# Patient Record
Sex: Female | Born: 1945 | Race: White | Hispanic: No | Marital: Married | Smoking: Former smoker
Health system: Southern US, Community
[De-identification: ages and names within clinical notes are randomized; demographics above are authoritative.]

## PROBLEM LIST (undated history)

## (undated) DIAGNOSIS — J8410 Pulmonary fibrosis, unspecified: Secondary | ICD-10-CM

## (undated) DIAGNOSIS — I340 Nonrheumatic mitral (valve) insufficiency: Secondary | ICD-10-CM

## (undated) DIAGNOSIS — E785 Hyperlipidemia, unspecified: Secondary | ICD-10-CM

## (undated) DIAGNOSIS — Z87891 Personal history of nicotine dependence: Secondary | ICD-10-CM

## (undated) DIAGNOSIS — F419 Anxiety disorder, unspecified: Secondary | ICD-10-CM

## (undated) DIAGNOSIS — K635 Polyp of colon: Secondary | ICD-10-CM

## (undated) DIAGNOSIS — I10 Essential (primary) hypertension: Secondary | ICD-10-CM

## (undated) DIAGNOSIS — J439 Emphysema, unspecified: Secondary | ICD-10-CM

## (undated) DIAGNOSIS — I447 Left bundle-branch block, unspecified: Secondary | ICD-10-CM

## (undated) DIAGNOSIS — I679 Cerebrovascular disease, unspecified: Secondary | ICD-10-CM

## (undated) DIAGNOSIS — K5790 Diverticulosis of intestine, part unspecified, without perforation or abscess without bleeding: Secondary | ICD-10-CM

## (undated) DIAGNOSIS — R42 Dizziness and giddiness: Secondary | ICD-10-CM

## (undated) DIAGNOSIS — E119 Type 2 diabetes mellitus without complications: Secondary | ICD-10-CM

## (undated) DIAGNOSIS — H269 Unspecified cataract: Secondary | ICD-10-CM

## (undated) DIAGNOSIS — I2510 Atherosclerotic heart disease of native coronary artery without angina pectoris: Secondary | ICD-10-CM

## (undated) HISTORY — DX: Unspecified cataract: H26.9

## (undated) HISTORY — PX: ABDOMINAL HYSTERECTOMY: SHX81

## (undated) HISTORY — DX: Dizziness and giddiness: R42

## (undated) HISTORY — DX: Type 2 diabetes mellitus without complications: E11.9

## (undated) HISTORY — PX: ANTERIOR CRUCIATE LIGAMENT REPAIR: SHX115

## (undated) HISTORY — DX: Diverticulosis of intestine, part unspecified, without perforation or abscess without bleeding: K57.90

## (undated) HISTORY — DX: Essential (primary) hypertension: I10

## (undated) HISTORY — DX: Polyp of colon: K63.5

## (undated) HISTORY — DX: Personal history of nicotine dependence: Z87.891

## (undated) HISTORY — DX: Emphysema, unspecified: J43.9

## (undated) HISTORY — DX: Atherosclerotic heart disease of native coronary artery without angina pectoris: I25.10

## (undated) HISTORY — DX: Anxiety disorder, unspecified: F41.9

## (undated) HISTORY — DX: Nonrheumatic mitral (valve) insufficiency: I34.0

## (undated) HISTORY — DX: Pulmonary fibrosis, unspecified: J84.10

## (undated) HISTORY — DX: Cerebrovascular disease, unspecified: I67.9

## (undated) HISTORY — DX: Left bundle-branch block, unspecified: I44.7

## (undated) HISTORY — DX: Hyperlipidemia, unspecified: E78.5

## (undated) HISTORY — PX: COLONOSCOPY: SHX174

## (undated) HISTORY — PX: CAROTID ENDARTERECTOMY: SUR193

---

## 1970-12-24 HISTORY — PX: VEIN SURGERY: SHX48

## 1979-12-25 HISTORY — PX: VESICOVAGINAL FISTULA CLOSURE W/ TAH: SUR271

## 2005-02-08 ENCOUNTER — Ambulatory Visit: Payer: Self-pay | Admitting: Internal Medicine

## 2005-02-14 ENCOUNTER — Ambulatory Visit: Payer: Self-pay | Admitting: Internal Medicine

## 2005-09-07 ENCOUNTER — Ambulatory Visit: Payer: Self-pay | Admitting: Internal Medicine

## 2006-05-08 ENCOUNTER — Ambulatory Visit: Payer: Self-pay | Admitting: Internal Medicine

## 2006-12-24 HISTORY — PX: BREAST BIOPSY: SHX20

## 2007-05-15 ENCOUNTER — Ambulatory Visit: Payer: Self-pay | Admitting: Internal Medicine

## 2007-06-12 ENCOUNTER — Ambulatory Visit: Payer: Self-pay | Admitting: Surgery

## 2008-02-11 ENCOUNTER — Ambulatory Visit: Payer: Self-pay | Admitting: Family Medicine

## 2008-03-22 ENCOUNTER — Ambulatory Visit: Payer: Self-pay | Admitting: Cardiology

## 2008-04-01 ENCOUNTER — Ambulatory Visit: Payer: Self-pay

## 2008-04-01 LAB — CONVERTED CEMR LAB
Chloride: 103 meq/L (ref 96–112)
Creatinine, Ser: 0.6 mg/dL (ref 0.4–1.2)
GFR calc non Af Amer: 107 mL/min
Glucose, Bld: 180 mg/dL — ABNORMAL HIGH (ref 70–99)

## 2008-04-07 ENCOUNTER — Ambulatory Visit: Payer: Self-pay | Admitting: Cardiology

## 2008-05-18 ENCOUNTER — Ambulatory Visit: Payer: Self-pay | Admitting: Family Medicine

## 2008-10-19 ENCOUNTER — Ambulatory Visit: Payer: Self-pay | Admitting: Cardiology

## 2009-03-08 ENCOUNTER — Ambulatory Visit: Payer: Self-pay

## 2009-03-08 ENCOUNTER — Encounter: Payer: Self-pay | Admitting: Cardiology

## 2009-04-05 ENCOUNTER — Ambulatory Visit: Payer: Self-pay | Admitting: Cardiology

## 2009-05-26 ENCOUNTER — Ambulatory Visit: Payer: Self-pay | Admitting: Family Medicine

## 2009-08-27 DIAGNOSIS — E119 Type 2 diabetes mellitus without complications: Secondary | ICD-10-CM

## 2009-08-27 DIAGNOSIS — I447 Left bundle-branch block, unspecified: Secondary | ICD-10-CM

## 2009-08-27 DIAGNOSIS — Z87891 Personal history of nicotine dependence: Secondary | ICD-10-CM

## 2009-08-27 DIAGNOSIS — E785 Hyperlipidemia, unspecified: Secondary | ICD-10-CM

## 2009-08-27 DIAGNOSIS — I10 Essential (primary) hypertension: Secondary | ICD-10-CM | POA: Insufficient documentation

## 2009-08-27 DIAGNOSIS — I2510 Atherosclerotic heart disease of native coronary artery without angina pectoris: Secondary | ICD-10-CM | POA: Insufficient documentation

## 2009-08-27 DIAGNOSIS — I679 Cerebrovascular disease, unspecified: Secondary | ICD-10-CM

## 2009-08-27 DIAGNOSIS — G459 Transient cerebral ischemic attack, unspecified: Secondary | ICD-10-CM | POA: Insufficient documentation

## 2010-02-07 DIAGNOSIS — I6529 Occlusion and stenosis of unspecified carotid artery: Secondary | ICD-10-CM

## 2010-03-07 ENCOUNTER — Encounter: Payer: Self-pay | Admitting: Cardiology

## 2010-03-07 ENCOUNTER — Ambulatory Visit: Payer: Self-pay

## 2010-03-22 ENCOUNTER — Ambulatory Visit: Payer: Self-pay | Admitting: Cardiovascular Disease

## 2010-03-23 ENCOUNTER — Encounter: Payer: Self-pay | Admitting: Cardiovascular Disease

## 2010-03-27 ENCOUNTER — Telehealth (INDEPENDENT_AMBULATORY_CARE_PROVIDER_SITE_OTHER): Payer: Self-pay

## 2010-03-28 ENCOUNTER — Ambulatory Visit: Payer: Self-pay

## 2010-03-28 ENCOUNTER — Ambulatory Visit: Payer: Self-pay | Admitting: Vascular Surgery

## 2010-03-28 ENCOUNTER — Telehealth (INDEPENDENT_AMBULATORY_CARE_PROVIDER_SITE_OTHER): Payer: Self-pay | Admitting: *Deleted

## 2010-03-28 ENCOUNTER — Ambulatory Visit: Payer: Self-pay | Admitting: Cardiology

## 2010-03-28 ENCOUNTER — Encounter (HOSPITAL_COMMUNITY): Admission: RE | Admit: 2010-03-28 | Discharge: 2010-05-24 | Payer: Self-pay | Admitting: Cardiovascular Disease

## 2010-04-03 ENCOUNTER — Telehealth: Payer: Self-pay | Admitting: Cardiovascular Disease

## 2010-04-05 ENCOUNTER — Ambulatory Visit: Payer: Self-pay | Admitting: Vascular Surgery

## 2010-04-05 ENCOUNTER — Inpatient Hospital Stay (HOSPITAL_COMMUNITY): Admission: RE | Admit: 2010-04-05 | Discharge: 2010-04-06 | Payer: Self-pay | Admitting: Vascular Surgery

## 2010-04-05 ENCOUNTER — Encounter: Payer: Self-pay | Admitting: Vascular Surgery

## 2010-04-11 ENCOUNTER — Telehealth: Payer: Self-pay | Admitting: Cardiovascular Disease

## 2010-04-18 ENCOUNTER — Encounter: Payer: Self-pay | Admitting: Cardiovascular Disease

## 2010-04-18 ENCOUNTER — Ambulatory Visit: Payer: Self-pay | Admitting: Vascular Surgery

## 2010-04-24 ENCOUNTER — Telehealth: Payer: Self-pay | Admitting: Cardiovascular Disease

## 2010-05-02 ENCOUNTER — Ambulatory Visit: Payer: Self-pay | Admitting: Vascular Surgery

## 2010-05-11 ENCOUNTER — Telehealth: Payer: Self-pay | Admitting: Cardiovascular Disease

## 2010-05-31 ENCOUNTER — Ambulatory Visit: Payer: Self-pay | Admitting: Family Medicine

## 2010-07-13 ENCOUNTER — Ambulatory Visit: Payer: Self-pay | Admitting: Vascular Surgery

## 2010-07-20 ENCOUNTER — Ambulatory Visit: Payer: Self-pay | Admitting: Vascular Surgery

## 2010-07-31 ENCOUNTER — Encounter: Payer: Self-pay | Admitting: Cardiovascular Disease

## 2010-08-31 ENCOUNTER — Ambulatory Visit: Payer: Self-pay | Admitting: Cardiovascular Disease

## 2010-10-02 ENCOUNTER — Encounter: Payer: Self-pay | Admitting: Cardiology

## 2010-10-03 ENCOUNTER — Ambulatory Visit: Payer: Self-pay

## 2010-10-03 ENCOUNTER — Encounter: Payer: Self-pay | Admitting: Cardiovascular Disease

## 2010-11-01 ENCOUNTER — Telehealth (INDEPENDENT_AMBULATORY_CARE_PROVIDER_SITE_OTHER): Payer: Self-pay | Admitting: *Deleted

## 2010-12-11 ENCOUNTER — Telehealth: Payer: Self-pay | Admitting: Cardiovascular Disease

## 2010-12-14 ENCOUNTER — Encounter: Payer: Self-pay | Admitting: Cardiovascular Disease

## 2011-01-23 NOTE — Assessment & Plan Note (Signed)
Summary: F6M/AMD   Visit Type:  Follow-up Primary Miranda Manning:  Dr. Burnett Sheng  CC:  Denies chest pain or shortness of breath..  History of Present Illness: Miranda Manning is a 65 year old woman with history of severe carotid arterial disease, coronary artery disease, moderate mitral regurgitation without prolapse, mild pulmonary hypertension,hyperlipidemia, hypertension and diabetes who presents for routine followup. she had a right carotid endarterectomy on April 13.  Recent carotid arterial study dated 03/07/10 shows 80-99% right proximal internal carotid arterial disease, 60-79% disease noted on the left (suspect low end of the range).  she is now retired from [employer], feels well and has no complaints.. she is active and has no chest discomfort, shortness of breath, lightheadedness near syncope.She states that she takes simvastatin 30 mg daily. She reports having problems on Lipitor and Crestor but she's not sure of the doses.  Last stress test was April 2009 which showed no significant ischemia.  EKG shows normal sinus rhythm with rate 66 beats per minute, left bundle branch block  Cholesterol this month is 193, LDL 112, HDL 60, triglycerides 100, hemoglobin A1c 6.7, glucose is 150   Current Medications (verified): 1)  Aspirin Ec 325 Mg Tbec (Aspirin) .... Take One Tablet By Mouth Daily 2)  Klor-Con M10 10 Meq Cr-Tabs (Potassium Chloride Crys Cr) .Marland Kitchen.. 1 Tab  By Mouth Daily 3)  Nadolol 40 Mg Tabs (Nadolol) .... Take 1/2 Tablet By Mouth Daily 4)  Simvastatin 20 Mg Tabs (Simvastatin) .... Take 1 1/2 Tablet By Mouth Daily At Bedtime 5)  Hydrochlorothiazide 25 Mg Tabs (Hydrochlorothiazide) .... Take One Tablet By Mouth Daily. 6)  Glipizide Xl 5 Mg Xr24h-Tab (Glipizide) .Marland Kitchen.. 1 Tab Daily 7)  Ativan 0.5 Mg Tabs (Lorazepam) .Marland Kitchen.. 1 Tab Three Times A Day As Needed 8)  Caltrate 600 1500 Mg Tabs (Calcium Carbonate) .Marland Kitchen.. 1 Tab Daily 9)  Vitamin E 1000 Unit Caps (Vitamin E) .Marland Kitchen.. 1 Tab Daily 10)  Vitamin C  Cr 500 Mg Cr-Tabs (Ascorbic Acid) .Marland Kitchen.. 1 Tab By Mouth Daily 11)  Vitamin D3 1000 Unit Caps (Cholecalciferol) .Marland Kitchen.. 1 Tab By Mouth Daily 12)  Fish Oil 1000 Mg Caps (Omega-3 Fatty Acids) .... 2 Tabs Daily 13)  Crestor 10 Mg Tabs (Rosuvastatin Calcium) .Marland Kitchen.. 1 Tab By Mouth At Bedtime  Allergies (verified): 1)  ! Codeine  Past History:  Past Medical History: Last updated: 08/27/2009 TOBACCO ABUSE, HX OF (ICD-V15.82) LEFT BUNDLE BRANCH BLOCK (ICD-426.3) HYPERLIPIDEMIA (ICD-272.4) DM (ICD-250.00) HYPERTENSION (ICD-401.9) CAD (ICD-414.00) CEREBROVASCULAR DISEASE (ICD-437.9) MITRAL REGURGITATION, SEVERE (ICD-396.3)  Past Surgical History: Last updated: 11/10/2009 Knee-repair after accident  Hysterectomy-74 Vein-leg  Family History: Last updated: 08/27/2009  Really noncontributory.      Social History: Last updated: 08/27/2009  She is married, has 3 children, one is a Engineer, civil (consulting) at   [employer] named Inetta Fermo.  I take care of her  husband, bobby Eustache.  He has had mitral valve replacement.  She works as  a Haematologist as mentioned above.   Risk Factors: Alcohol Use: 0 (03/22/2010) Caffeine Use: 0 (03/22/2010) Exercise: no (03/22/2010)  Risk Factors: Smoking Status: quit > 6 months (03/22/2010) Packs/Day: 0.75 (03/22/2010)  Review of Systems  The patient denies fever, weight loss, weight gain, vision loss, decreased hearing, hoarseness, chest pain, syncope, dyspnea on exertion, peripheral edema, prolonged cough, abdominal pain, incontinence, muscle weakness, depression, and enlarged lymph nodes.    Vital Signs:  Patient profile:   65 year old female Height:      61.5  inches Weight:      145 pounds BMI:     27.05 Pulse rate:   60 / minute BP sitting:   158 / 74  (left arm) Cuff size:   regular  Vitals Entered By: Bishop Dublin, CMA (August 31, 2010 11:37 AM)  Physical Exam  General:  Well developed, well nourished, in no acute distress. Head:   normocephalic and atraumatic Neck:  Neck supple, no JVD. No masses, thyromegaly or abnormal cervical nodes. Lungs:  Clear bilaterally to auscultation and percussion. Heart:  Non-displaced PMI, chest non-tender; regular rate and rhythm, S1, S2 without murmurs, rubs or gallops. Carotid upstroke normal, 1+ bruit on the left, healing CEA site on the right.  Pedals normal pulses. No edema, no varicosities. Abdomen:   abdomen soft and non-tender without masses Msk:  Back normal, normal gait. Muscle strength and tone normal. Pulses:  pulses normal in all 4 extremities Extremities:  No clubbing or cyanosis. Neurologic:  Alert and oriented x 3. Skin:  Intact without lesions or rashes. Psych:  Normal affect.   Impression & Recommendations:  Problem # 1:  CAROTID ARTERY DISEASE (ICD-433.10) recent carotid endarterectomy by Dr. Hart Rochester in [place]. She is doing well 5 months following her surgery.  Her updated medication list for this problem includes:    Aspirin Ec 325 Mg Tbec (Aspirin) .Marland Kitchen... Take one tablet by mouth daily  Problem # 2:  HYPERLIPIDEMIA (ICD-272.4) She has been able to tolerate simvastatin 30 mg daily. Her LDL continues to be above goal at 112. We will add zetia 5 mg, titrating up to 10 mg daily. we have encouraged her to try to lose several pounds in an effort to decrease her cholesterol.  Her updated medication list for this problem includes:    Simvastatin 20 Mg Tabs (Simvastatin) .Marland Kitchen... Take 1 1/2 tablet by mouth daily at bedtime  Problem # 3:  HYPERTENSION (ICD-401.9) blood pressure is well controlled on her current medication regimen.  Her updated medication list for this problem includes:    Aspirin Ec 325 Mg Tbec (Aspirin) .Marland Kitchen... Take one tablet by mouth daily    Nadolol 40 Mg Tabs (Nadolol) .Marland Kitchen... Take 1/2 tablet by mouth daily    Hydrochlorothiazide 25 Mg Tabs (Hydrochlorothiazide) .Marland Kitchen... Take one tablet by mouth daily.  Other Orders: EKG w/ Interpretation  (93000)

## 2011-01-23 NOTE — Progress Notes (Signed)
Summary: Nuc. Pre-Procedure  Phone Note Outgoing Call Call back at [facility] Phone [phone]   Call placed by: Irean Hong, RN,  March 27, 2010 2:18 PM Summary of Call: Left message with information on Myoview Information Sheet (see scanned document for details).      Nuclear Med Background Indications for Stress Test: Evaluation for Ischemia, Surgical Clearance  Indications Comments: Pending (R) CEA by  History: Echo, Myocardial Perfusion Study, MVP  History Comments: 4/09 MPS: (-) ischemia, EF=63%. 03/07/10 Echo : EF=50-55%, MVP, Mod. MR  Symptoms: DOE    Nuclear Pre-Procedure Cardiac Risk Factors: Carotid Disease, History of Smoking, Hypertension, LBBB, Lipids, NIDDM, PVD Height (in): 61

## 2011-01-23 NOTE — Progress Notes (Signed)
Summary: RX  Phone Note Refill Request Call back at Home Phone [phone] Message from:  Patient on May 11, 2010 10:59 AM  Refills Requested: Medication #1:  SIMVASTATIN 20 MG TABS Take 1 1/2 tablet by mouth daily at bedtime [facility]-PT HAS DISCONTINUED CRESTOR AND IS FEELING BETTER AND WOULD LIKE TO GO BACK ON SIMVASTATIN  Initial call taken by: Harlon Flor,  May 11, 2010 11:00 AM Caller: SELF Call For: Miranda Manning  Follow-up for Phone Call        Pt unwilling to reduce or retry Crestor.  Requesting to go back on Simvastatin 30mg  daily which she has previously been able to tolerate without side effects.  Please advise if this is appropriate and acceptable.  Thanks. Follow-up by: Cloyde Reams RN,  May 12, 2010 9:22 AM    Additional Follow-up for Phone Call Additional follow up Details #2::    We can switch back to simvastatin though cholesterol was too high on that alone. Will need to add zetia which can drop number another 10%   Appended Document: RX Attempted to call pt.  LMOM TCB. EWJ  Appended Document: RX Called spoke with pt, pt adamently states she is NOT going to take any additional cholesterol meds.  She is taking the max her body can tolerate, she refuses to take any additional meds, and refuses to be in pain secondary to those medications.  Pt states she is going back to Dr Burnett Sheng to have her cholesterol checked in July.  EWJ

## 2011-01-23 NOTE — Miscellaneous (Signed)
Summary: Orders Update  Clinical Lists Changes  Orders: Added new Test order of Carotid Duplex (Carotid Duplex) - Signed 

## 2011-01-23 NOTE — Progress Notes (Signed)
Summary: SEND CAROTID  Phone Note Call from Patient Call back at Home Phone [phone]   Caller: SELF Call For: [name] Summary of Call: PT WOULD LIKE A COPY OF HER CAROTID ULTRASOUND TO BE SENT TO [name] #[phone] Initial call taken by: Harlon Flor,  November 01, 2010 10:08 AM

## 2011-01-23 NOTE — Progress Notes (Signed)
Summary: MEDICATION PROBLEMS  Phone Note Call from Patient Call back at Home Phone [phone]   Caller: SELF Call For: [facility] Summary of Call: PT IS HAVING PAIN WITH CRESTOR AND WOULD LIKE TO SWITCH TO A DIFFERENT MEDICATION Initial call taken by: Harlon Flor,  Apr 24, 2010 9:35 AM  Follow-up for Phone Call        Increased Crestor to 10mg  once daily on 04/11/10, leg cramping x last 3 nights, aching pain.  Took Crestor 3 weeks prior to increase without problems.  Has prviously been on Zocor no problems on 30mg  daily, but unable to tolerate 40mg  d/t leg cramping and pains.  Please advise. Thanks.   Follow-up by: Cloyde Reams RN,  Apr 24, 2010 9:58 AM  Additional Follow-up for Phone Call Additional follow up Details #1::        Would go back on lower dose crestor for now, 5 mg daily. 10 mg three times a week     Appended Document: MEDICATION PROBLEMS Called spoke with pt advised of Dr Windell Hummingbird recommendations to decr Crestor back to 5mg  daily, and try to gradually incr up to 10mg  three times a week.  Pt states she is not going to increase the Crestor the pain is not worth it.  Didn't take any Crestor last night and she didn't have any leg cramps.  Pt states she has been through this before with the cholesterol medications and she doesn't understand why she has to try this again.  She will decr Crestor back to 5mg  daily and will discuss at next OV with Dr Mariah Milling. EWJ

## 2011-01-23 NOTE — Progress Notes (Signed)
Summary: RX  Phone Note Refill Request Call back at Home Phone [phone] Message from:  Patient on April 11, 2010 9:49 AM  Refills Requested: Medication #1:  CRESTOR 10 MG TABS Take 1/2 tablet by mouth daily for 3 weeks then increase 10 mg daily. DOES THE DR Genella Rife TO BUMP THIS UP?  PT NEEDS A REFILL-[facility]  Initial call taken by: Harlon Flor,  April 11, 2010 9:49 AM    New/Updated Medications: CRESTOR 10 MG TABS (ROSUVASTATIN CALCIUM) 1 tab by mouth at bedtime Prescriptions: CRESTOR 10 MG TABS (ROSUVASTATIN CALCIUM) 1 tab by mouth at bedtime  #30 x 6   Entered by:   Charlena Cross, RN, BSN   Authorized by:   Dossie Arbour MD   Signed by:   Charlena Cross, RN, BSN on 04/11/2010   Method used:   Electronically to        [facility] (retail)       [address]       [place], [place]  [address]       Ph: [phone]       Fax: [phone]   RxID:   [id number]

## 2011-01-23 NOTE — Assessment & Plan Note (Signed)
Summary: Cardiology Nuclear Study  Nuclear Med Background Indications for Stress Test: Evaluation for Ischemia, Surgical Clearance  Indications Comments: Pending (R) CEA by  History: Echo, Myocardial Perfusion Study, MVP  History Comments: 4/09 MPS: (-) ischemia, EF=63%. 03/07/10 Echo : EF=50-55%, MVP, Mod. MR  Symptoms: DOE    Nuclear Pre-Procedure Cardiac Risk Factors: Carotid Disease, History of Smoking, Hypertension, LBBB, Lipids, NIDDM, PVD Caffeine/Decaff Intake: none NPO After: 8:00 PM Lungs: clear IV 0.9% NS with Angio Cath: 20g     IV Site: (R) AC IV Started by: Stanton Kidney EMT-P Chest Size (in) 36     Cup Size D     Height (in): 61.5 Weight (lb): 139 BMI: 25.93 Tech Comments: This patient was in a Nodal rhythm today, we were unable to do Adenosine for this reason. S.Klein was consulted and agreed that we could do Lexiscan.  Nuclear Med Study 1 or 2 day study:  1 day     Stress Test Type:  Eugenie Birks Reading MD:  Olga Millers, MD     Referring MD:  T.Gollan Resting Radionuclide:  Technetium 76m Tetrofosmin     Resting Radionuclide Dose:  11.0 mCi  Stress Radionuclide:  Technetium 41m Tetrofosmin     Stress Radionuclide Dose:  33.0 mCi   Stress Protocol   Lexiscan: 0.4 mg   Stress Test Technologist:  Milana Na EMT-P     Nuclear Technologist:  Domenic Polite CNMT  Rest Procedure  Myocardial perfusion imaging was performed at rest 45 minutes following the intravenous administration of Myoview Technetium 57m Tetrofosmin.  Stress Procedure  The patient received IV Lexiscan 0.4 mg over 15-seconds.  Myoview injected at 30-seconds.  There were no significant changes with infusion.  Quantitative spect images were obtained after a 45 minute delay.  QPS Raw Data Images:  Acuisition technically good; normal left ventricular size. Stress Images:  There is mild decreased uptake in the septum. Rest Images:  There is mild decreased uptake in the septum. Subtraction  (SDS):  No evidence of ischemia. Transient Ischemic Dilatation:  1.18  (Normal <1.22)  Lung/Heart Ratio:  .28  (Normal <0.45)  Quantitative Gated Spect Images QGS EDV:  99 ml QGS ESV:  38 ml QGS EF:  62 % QGS cine images:  Normal wall motion.   Overall Impression  Exercise Capacity: Lexiscan study with no exercise. BP Response: Normal blood pressure response. Clinical Symptoms: No chest pain ECG Impression: Not interpretable due to baseline LBBB. Overall Impression: Low risk lexiscan nuclear study with small fixed septal defect possibly secondary to patient's LBBB; no ischemia; note, patient with baseline sinus rhythm with occasional junctional escape beats.

## 2011-01-23 NOTE — Progress Notes (Signed)
Summary: PROBLEMS  Phone Note Call from Patient Call back at Home Phone [phone]   Caller: SELF Call For: [facility] Summary of Call: PT IS HAVING SINUS DRAINAGE AND WOULD LIKE TO KNOW IF SHE COULD HAVE SOMETHING CALLED IN Initial call taken by: Harlon Flor,  April 03, 2010 8:28 AM  Follow-up for Phone Call        instructed pt to try chloricidine HBP OTC. Follow-up by: Charlena Cross, RN, BSN,  April 03, 2010 10:01 AM

## 2011-01-23 NOTE — Progress Notes (Signed)
Summary: [facility]   Imported By: Harlon Flor 04/25/2010 09:38:59  _____________________________________________________________________  External Attachment:    Type:   Image     Comment:   External Document

## 2011-01-23 NOTE — Assessment & Plan Note (Signed)
Summary: F/U 1 YEAR   Visit Type:  Follow-up Primary Provider:  Dr. Burnett Sheng  CC:  occ. headache, SOB with vacuuming the floor, and RLE edema r/t prior surgeries.  History of Present Illness: Miranda Manning is a 65 year old woman with history of severe carotid arterial disease, coronary artery disease, moderate mitral regurgitation without prolapse, mild pulmonary hypertension,hyperlipidemia, hypertension and diabetes who presents for routine followup.  Recent carotid arterial study dated 03/07/10 shows 80-99% right proximal internal carotid arterial disease, 60-79% disease noted on the left (suspect low end of the range).  Dr. wall has suggested that she have a second opinion from a vascular surgeon and we will help to set this up. Otherwise she is asymptomatic  She states that she takes simvastatin 30 mg daily. She reports having problems on Lipitor and Crestor but she's not sure of the doses.  she is active and has no other complaints of chest discomfort, shortness of breath, lightheadedness near syncope.  Last stress test was April 2009 which showed no significant ischemia.   Preventive Screening-Counseling & Management  Alcohol-Tobacco     Alcohol drinks/day: 0     Smoking Status: quit > 6 months     Packs/Day: 0.75     Year Started: 1963     Year Quit: 1989  Caffeine-Diet-Exercise     Caffeine use/day: 0     Does Patient Exercise: no  Current Problems (verified): 1)  Carotid Artery Disease  (ICD-433.10) 2)  Tobacco Abuse, Hx of  (ICD-V15.82) 3)  Left Bundle Branch Block  (ICD-426.3) 4)  Hyperlipidemia  (ICD-272.4) 5)  Dm  (ICD-250.00) 6)  Hypertension  (ICD-401.9) 7)  Cad  (ICD-414.00) 8)  Cerebrovascular Disease  (ICD-437.9) 9)  Mitral Regurgitation, Severe  (ICD-396.3)  Current Medications (verified): 1)  Aspirin Ec 325 Mg Tbec (Aspirin) .... Take One Tablet By Mouth Daily 2)  Klor-Con M10 10 Meq Cr-Tabs (Potassium Chloride Crys Cr) .Marland Kitchen.. 1 Tab  By Mouth Daily 3)   Nadolol 40 Mg Tabs (Nadolol) .... Take 1/2 Tablet By Mouth Daily 4)  Simvastatin 20 Mg Tabs (Simvastatin) .... Take 1 1/2 Tablet By Mouth Daily At Bedtime 5)  Hydrochlorothiazide 25 Mg Tabs (Hydrochlorothiazide) .... Take One Tablet By Mouth Daily. 6)  Glipizide Xl 5 Mg Xr24h-Tab (Glipizide) .Marland Kitchen.. 1 Tab Daily 7)  Ativan 0.5 Mg Tabs (Lorazepam) .Marland Kitchen.. 1 Tab Three Times A Day As Needed 8)  Caltrate 600 1500 Mg Tabs (Calcium Carbonate) .Marland Kitchen.. 1 Tab Daily 9)  Vitamin E 1000 Unit Caps (Vitamin E) .Marland Kitchen.. 1 Tab Daily 10)  Vitamin C Cr 500 Mg Cr-Tabs (Ascorbic Acid) .Marland Kitchen.. 1 Tab By Mouth Daily 11)  Vitamin D3 1000 Unit Caps (Cholecalciferol) .Marland Kitchen.. 1 Tab By Mouth Daily 12)  Fish Oil 1000 Mg Caps (Omega-3 Fatty Acids) .... 2 Tabs Daily  Allergies (verified): 1)  ! Codeine  Social History: Smoking Status:  quit > 6 months Packs/Day:  0.75 Alcohol drinks/day:  0 Caffeine use/day:  0 Does Patient Exercise:  no  Review of Systems  The patient denies fever, weight loss, weight gain, vision loss, decreased hearing, hoarseness, chest pain, syncope, dyspnea on exertion, peripheral edema, prolonged cough, abdominal pain, incontinence, muscle weakness, depression, and enlarged lymph nodes.    Vital Signs:  Patient profile:   65 year old female Height:      61 inches Weight:      141.50 pounds BMI:     26.83 Pulse rate:   56 / minute Pulse rhythm:   regular BP  sitting:   138 / 70  (left arm) BP standing:   174 / 76  (right arm) Cuff size:   regular  Vitals Entered By: Charlena Cross, RN, BSN (March 22, 2010 9:58 AM)  Physical Exam  General:  well-appearing middle-aged woman in no apparent distress, HEENT is benign, neck is supple, no JVP, 2+ carotid bruit auscultated on the right, heart sounds are regular with normal S1-S2 no murmurs appreciated the lungs appear to auscultation with no wheezes Rales, abdominal exam is benign, no significant lower extremity edema, neurologic exam is nonfocal the skin is  warm and dry, pulses are equal and symmetrical in her upper extremities, mildly diminished in her lower extremities.   Impression & Recommendations:  Problem # 1:  CAROTID ARTERY DISEASE (ICD-433.10) she now has severe right carotid arterial disease. Since last year, she has taken herself off as a for uncertain reasons. She states her cholesterol is greater than 170 which is well above goal for her. Her goal is less than 150 with LDL less than 70 and her severe peripheral vascular disease.  We have talked her about this and she is unwilling to raise the dose of her simvastatin. we have suggested that she retry Crestor at 5 mg daily for several weeks before titrating up to 10 mg daily. If she is unable to tolerate this, we'll add zetia at 10 mg daily.  In preparation for carotid arterial surgery on the right, we have ordered a stress test to clear her. We will refer her to vascular surgery in [place].  Her updated medication list for this problem includes:    Aspirin Ec 325 Mg Tbec (Aspirin) .Marland Kitchen... Take one tablet by mouth daily  Problem # 2:  HYPERLIPIDEMIA (ICD-272.4) Please see above, we are trying to achieve a total cholesterol less than 150 given her severe and progressive peripheral vascular disease. She is reluctant to go to higher doses of her medication and states her cholesterol is greater than 170. We will try Crestor 5 mg titrating up to 10 mg daily if she is able to tolerate this. If she is unable to tolerate Crestor, we will try zetia.    Her updated medication list for this problem includes:    Simvastatin 20 Mg Tabs (Simvastatin) .Marland Kitchen... Take 1 1/2 tablet by mouth daily at bedtime    Crestor 10 Mg Tabs (Rosuvastatin calcium) .Marland Kitchen... Take 1/2 tablet by mouth daily for 3 weeks then increase 10 mg daily  Problem # 3:  HYPERTENSION (ICD-401.9) Blood pressure appears relatively well-controlled on today's visit.  Her updated medication list for this problem includes:    Aspirin Ec  325 Mg Tbec (Aspirin) .Marland Kitchen... Take one tablet by mouth daily    Nadolol 40 Mg Tabs (Nadolol) .Marland Kitchen... Take 1/2 tablet by mouth daily    Hydrochlorothiazide 25 Mg Tabs (Hydrochlorothiazide) .Marland Kitchen... Take one tablet by mouth daily.  Other Orders: Nuclear Stress Test (Nuc Stress Test)  Patient Instructions: 1)  Your physician recommends that you schedule a follow-up appointment in: 6 months 2)  Your physician has requested that you have an adenosine myoview.  For further information please visit [email or web address]  Please follow instruction sheet, as given. 03/28/10 @ 8:30. 3)  You have been referred to [facility] in [place] . 4)  Your physician has recommended you make the following change in your medication: crestor 5 mg daily for 3 weeks then increase dose to 10 mg daily

## 2011-01-23 NOTE — Progress Notes (Signed)
  Faxed stress over to [facility] to fax [phone] [facility]  March 28, 2010 4:31 PM

## 2011-01-25 NOTE — Progress Notes (Signed)
Summary: Cholesterol  Phone Note Call from Patient Call back at Home Phone [phone] Call back at [phone]   Caller: Self Call For: Gollan Summary of Call: Pt is returning a call regarding her Cholesterol.  States that she has been waiting since Friday for a call back. Initial call taken by: Harlon Flor,  December 11, 2010 10:16 AM  Follow-up for Phone Call        Notified patient of issues with cholesterol.  She is having problems with money regarding the zetia it is too expensive.  She also would like to know if can switch to generic lipitor; can't tolerate more than 30 mg.  She only wants to switch to lipitor if you think will benefit her.  See las o.v. with recent cholesterol readings.  Follow-up by: Bishop Dublin, CMA,  December 11, 2010 10:34 AM  Additional Follow-up for Phone Call Additional follow up Details #1::        LMOM we will call in the a.m. regarding medications. Additional Follow-up by: Bishop Dublin, CMA,  December 11, 2010 4:57 PM    Additional Follow-up for Phone Call Additional follow up Details #2::    Cholesterol is 193 LDL is 112. She needs to be chol <150, LDL <70 She has moderate carotid disease. High CVA risk. She needs crestor 40, lipitor 80, or stick with what you have and start zetia 10 (that will probably not be good enough, but better) zetia good for 10% drop   Appended Document: Cholesterol Patient does not want to do what Dr. Mariah Milling suggested.  She states, "If he can't understand that then she will go see Dr. Daleen Squibb in [place]".  She will try the Simvastatin 40 mg but she can't afford the Zetia or crestor. I explained to her what Dr. Mariah Milling recommended her to do but she refused to follow his instructions.  She would like to start simvastin 40 mg one tablet at bedtime but can't afford the zetia.  Appended Document: Cholesterol Patient called again regarding her cholesterol medication.  She is taking Simvastatin 20 mg 1 1/2 tablet at  bedtime.  She will increase to simvastin 40 mg one tablet at bedtime and have a cholesterol check in March 2012 with Dr. Burnett Sheng.  A Rx. called into [facility] for Simvastatin 40 mg one tablet at bedtime.

## 2011-01-25 NOTE — Miscellaneous (Signed)
  Clinical Lists Changes  Medications: Changed medication from SIMVASTATIN 20 MG TABS (SIMVASTATIN) one tablet at bedtime to SIMVASTATIN 40 MG TABS (SIMVASTATIN) one tablet at bedtime

## 2011-03-14 LAB — CBC
HCT: 34.9 % — ABNORMAL LOW (ref 36.0–46.0)
HCT: 43.2 % (ref 36.0–46.0)
Hemoglobin: 15 g/dL (ref 12.0–15.0)
MCHC: 33.9 g/dL (ref 30.0–36.0)
MCV: 89.4 fL (ref 78.0–100.0)
MCV: 89.5 fL (ref 78.0–100.0)
Platelets: 164 10*3/uL (ref 150–400)
RBC: 3.91 MIL/uL (ref 3.87–5.11)
RBC: 4.82 MIL/uL (ref 3.87–5.11)
WBC: 7.3 10*3/uL (ref 4.0–10.5)
WBC: 8.7 10*3/uL (ref 4.0–10.5)

## 2011-03-14 LAB — URINE MICROSCOPIC-ADD ON

## 2011-03-14 LAB — BASIC METABOLIC PANEL
Chloride: 96 mEq/L (ref 96–112)
Creatinine, Ser: 0.52 mg/dL (ref 0.4–1.2)
GFR calc Af Amer: >60 mL/min (ref >60)

## 2011-03-14 LAB — COMPREHENSIVE METABOLIC PANEL
ALT: 14 U/L (ref 0–35)
AST: 16 U/L (ref 0–37)
BUN: 9 mg/dL (ref 6–23)
Calcium: 10.1 mg/dL (ref 8.4–10.5)
Creatinine, Ser: 0.48 mg/dL (ref 0.4–1.2)
GFR calc Af Amer: >60 mL/min (ref >60)
Glucose, Bld: 142 mg/dL — ABNORMAL HIGH (ref 70–99)
Potassium: 4.2 mEq/L (ref 3.5–5.1)
Sodium: 139 mEq/L (ref 135–145)
Total Protein: 6.4 g/dL (ref 6.0–8.3)

## 2011-03-14 LAB — BLOOD GAS, ARTERIAL
Bicarbonate: 26.8 mEq/L — ABNORMAL HIGH (ref 20.0–24.0)
FIO2: 0.21 %
Patient temperature: 98.6
pH, Arterial: 7.453 — ABNORMAL HIGH (ref 7.350–7.400)

## 2011-03-14 LAB — TYPE AND SCREEN
ABO/RH(D): O POS
Antibody Screen: NEGATIVE

## 2011-03-14 LAB — GLUCOSE, CAPILLARY
Glucose-Capillary: 123 mg/dL — ABNORMAL HIGH (ref 70–99)
Glucose-Capillary: 128 mg/dL — ABNORMAL HIGH (ref 70–99)
Glucose-Capillary: 139 mg/dL — ABNORMAL HIGH (ref 70–99)

## 2011-03-14 LAB — ABO/RH: ABO/RH(D): O POS

## 2011-03-14 LAB — URINALYSIS, ROUTINE W REFLEX MICROSCOPIC
Glucose, UA: NEGATIVE mg/dL
Hgb urine dipstick: NEGATIVE
Urobilinogen, UA: 0.2 mg/dL (ref 0.0–1.0)

## 2011-03-14 LAB — APTT: aPTT: 28 seconds (ref 24–37)

## 2011-03-14 LAB — PROTIME-INR: INR: 0.93 (ref 0.00–1.49)

## 2011-05-08 NOTE — H&P (Signed)
HISTORY AND PHYSICAL EXAMINATION   March 28, 2010   Re:  Miranda Manning, Miranda Manning              DOB:  02-13-1946   The patient is a 65 year old female patient followed by Dr. Daleen Squibb and Dr.  Mariah Milling for many years with known mild carotid occlusive disease which  has progressed with time and now exceeds 80% in severity on the right  side.  She denies any history of stroke, TIAs, amaurosis fugax,  diplopia, blurred vision or syncope.  Recent carotid duplex performed at  [facility] on March 15 revealed a 90% right internal carotid  stenosis and mild left internal carotid stenosis in the 40%-50% range.   CHRONIC MEDICAL PROBLEMS:  1. Left bundle branch block.  2. Hyperlipidemia.  3. Diabetes mellitus.  4. Hypertension.  5. Coronary artery disease.  6. History of mitral regurgitation.   She had a stress test performed of 2009 with no ischemia.  She had  repeat of a Myoview nuclear medicine scan performed on 03/28/2010 and a  2-D echocardiogram revealed ejection fraction of 55%.   PAST SURGICAL HISTORY:  1. Hysterectomy.  2. Removal of varicose veins in right leg 1972.  3. Right knee surgery 1978.   FAMILY HISTORY:  Positive for coronary artery disease in her mother,  father and brother, stroke in grandparents.  Negative for diabetes.   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  She has not smoked in 21 years, does not use alcohol.   REVIEW OF SYSTEMS:  She does have dyspnea on exertion and easy  fatigability.  No chest pain.  No productive cough, bronchitis,  wheezing.  Denies any GI or GU symptoms.  Does have right knee  discomfort with ambulation, arthritis and joint pain.  All other systems  in review of systems are negative.   PHYSICAL EXAMINATION:  Vital signs:  Blood pressure 130/60, heart rate  66, temperature 97.9.  General:  She is a well-developed, well-nourished  female in no apparent distress.  She is alert and oriented  x3.  HEENT:  Is normal.  EOMs intact.  Chest:  Clear to auscultation.  No rhonchi or  wheezing.  Cardiovascular:  Regular rhythm.  No murmurs.  Carotid pulses  are 3+ with a high-pitched bruit on the right, softer bruit on the left.  Lower extremity exam reveals 2+ femoral, popliteal, dorsalis pedis  pulses.  No distal edema.  Abdomen:  Soft, nontender with no masses.  Musculoskeletal:  Free of major deformities.  Neurologic:  Normal.  Skin:  Free of rashes.   Today I ordered a carotid duplex exam which I reviewed and interpreted  to confirm the previous findings.  She does have a focal 90%-95% right  internal carotid stenosis in agreement with [facility] study.  I  also reviewed all of these records provided by [employer] at  [place].   IMPRESSION:  Severe right internal carotid stenosis - asymptomatic.   Plan is to proceed with right carotid endarterectomy on April 13.  The  risks and benefits have been thoroughly discussed with the patient and  her daughter.  She will obtain the results of the Myoview study which  was performed earlier today at [facility].     Quita Skye Hart Rochester, M.D.  Electronically Signed   JDL/MEDQ  D:  03/28/2010  T:  03/29/2010  Job:  [id number]   cc:   Antonieta Iba, MD  Thomas C. Daleen Squibb, MD, [facility]  Dr Burnett Sheng

## 2011-05-08 NOTE — Assessment & Plan Note (Signed)
[facility]                            [place] OFFICE NOTE   Miranda Manning, Miranda Manning                           MRN:          [id number]  DATE:03/22/2008                            DOB:          [birth year]    CHIEF COMPLAINT:  I wanted you to check out my heart murmur and make  sure I am doing the right things.   Miranda Manning is a delightful 65 year old married white female, wife  of a patient of mine, who comes today with the above concerns.   She was told at age 38 and she had rheumatic fever.  She has had a heart  murmur for years.   She currently complains of dyspnea on exertion with vacuuming or doing  any kind of heavy housework.  She denies any angina per se.  She denies  any orthopnea, PND or peripheral edema.   She has had no palpitations and no history of atrial fib or  irregularity, no presyncope or syncope.   She recently saw Dr. Jerl Mina for a physical and had a 2-D  echocardiogram because her murmur.  This showed anterior mitral valve  leaflet prolapse with severe mitral regurgitation.  Her left ventricular  ejection fraction was 50%.  Though there was no mention that her left  ventricle was enlarged.  In fact, in diastole it was 4.3 cm.  She had  mild left atrial enlargement of 4.6 cm.  There was moderate left  ventricular hypertrophy with a longstanding history of hypertension.  She had mild aortic regurgitation and mild tricuspid regurgitation.   He also heard carotid bruits and obtained carotid Dopplers.  She has a  moderate calcific plaque seen bilaterally.  She had a 50-75% proximal  right internal carotid stenosis.  No hemodynamically significant  stenosis on the left with antegrade flow in both vertebral.   She has a history of a left bundle branch block, duration unknown.  As  mentioned no she has no history of syncope, presyncope.   PAST MEDICAL HISTORY:  She is diabetic.  Last hemoglobin A1c was 6.4%.  She has a  history of hyperlipidemia and has been on simvastatin.  She  has a long history of hypertension and has been on nadolol for 31  years!.   She quit smoking in 1989.  She does not use any recreational products  nor any alcohol.  She does not exercise on a regular basis.  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   Her current medications are nadolol 20 mg p.o. daily, simvastatin 20 mg  a day, hydrochlorothiazide 25 mg a day, glipizide 5 mg a day, vitamin C  5 mg a day, calcium 600 mg a day, potassium over-the-counter, vitamin E  enteric-coated aspirin 325 mg per day, fish oil 4 grams a day.  She  takes lorazepam p.r.n.  She is intolerant of CODEINE.   PREVIOUS SURGERIES:  She had a right knee repair after an accident.  She  does not remember the year.  Hysterectomy and she has had varicose veins  operated on in the right leg.   FAMILY HISTORY:  Really noncontributory.   SOCIAL HISTORY:  She is married, has 3 children, one is a Engineer, civil (consulting) at  [employer] named Inetta Fermo.  I take care of her  husband, bobby Pottenger.  He has had mitral valve replacement.  She works as  a Haematologist as mentioned above.   REVIEW OF SYSTEMS:  All systems questioned and she lists none.  All are  negative except the HPI.   EXAM:  Her blood pressure is 165/88 in the right arm.  Her pulse is 63  and she has a left bundle with first-degree AV block of 244  milliseconds.  The left bundle was old.  She is 5 feet 1 inches, and  weighs 139 pounds.  HEENT:  Normocephalic, atraumatic.  PERRL.  Extraocular is intact.  Sclerae are clear.  She wears glasses.  Facial symmetry is normal.  Dentition satisfactory.  Neck is supple.  Carotids reveal bilateral bruits, right greater than  left.  Thyroid is not enlarged.  Trachea is midline.  LUNGS:  Clear.  HEART:  Reveals a nondisplaced PMI.  She has a loud mid systolic classic  prolapse murmur at the  apex.  It is at least a 3/6.  She has a very  faint diastolic murmur along left sternal border.  There is no gallop.  There is no right ventricular lift.  S2 is paradoxically split with her  left bundle  ABDOMINAL:  Soft, good bowel sounds.  No midline bruit.  There is no  hepatomegaly.  EXTREMITIES:  No cyanosis, clubbing or edema.  Pulses were 2+/4+  bilateral symmetrical.  She has venous varicosities particular right  lower extremity.  She has an old total knee scar on the right.  There is  no sign of DVT.  NEURO:  Exam is intact.  Skin is unremarkable.   ASSESSMENT:  1. Dyspnea on exertion.  With her carotid disease and risk factors,      particularly diabetes, we need to rule out ischemic equivalent      disease.  2. Cerebrovascular disease with nonobstructive carotid plaque.  3. Anterior leaflet mitral valve prolapse with severe mitral      regurgitation.  She has some left atrial enlargement but her left      ventricle is not enlarged at present.  4. Hypertension.  I suspect this has not been under good control,      particularly with her EF being down, not to mention moderate left      ventricular hypertrophy by echo.  5. Type 2 diabetes under good control.  6. Hyperlipidemia on statin.  7. History remote tobacco.  8. Left bundle branch block.   PLAN:  I had a long discussion with Miranda Manning today.  We need to rule  out any ischemic heart disease.  I then need to see her back to go over  these results and perhaps work on her antihypertensive with Dr.  Webb Silversmith input.   PLAN:  1. Exercise rest stress Myoview off of nadolol.  2. Follow with me in several weeks to go over these results and to      work with Dr. Burnett Sheng on optimizing her antihypertensives.  The      patient is on an ACE inhibitor for afterload reduction and LVH.     Thomas C. Daleen Squibb, MD, [facility]  Electronically Signed    TCW/MedQ  DD: 03/22/2008  DT: 03/22/2008  Job #: [id number]   cc:   Jerl Mina

## 2011-05-08 NOTE — Assessment & Plan Note (Signed)
[facility] OFFICE NOTE   MYCHELE, SEYLLER                           MRN:          [id number]  DATE:10/19/2008                            DOB:          [birth year]    Ms. Totton returns today for followup.  She is totally asymptomatic.   She continues not to smoke.   PROBLEM LIST:  1. Severe mitral regurgitation with anterior mitral valve leaflet      prolapse.  Echo in March 2009.  2. Normal left ventricular chamber size with an ejection fraction of      50%.  3. Nonobstructive cerebrovascular disease.  4. Clinically occult coronary artery disease with a negative stress      Myoview on April 01, 2008.  5. Hypertension.  6. Type 2 diabetes, under good control.  7. Hyperlipidemia with intolerance to Vytorin and Crestor.  She is on      simvastatin.  8. History of remote tobacco.  9. Left bundle-branch block.   Her meds are unchanged since last visit except she is now on Welchol 2  tablets b.i.d. per Dr. Burnett Sheng.  She is also on potassium, unknown dose.   Her blood pressure today is 140/76.  Her pulse is 52 and regular.  Her  weight is 141, which is stable.  The rest of her exam is unchanged.   Ms. Nery is stable from my standpoint.  However, return in March at  which time we will repeat a 2-D echocardiogram as well as her carotid  Dopplers.  We spent a lot of time talking about symptoms of cardiac  decompensation as well as coronary insufficiency.  We talked about risk  factors and a good choice she is making taking her medications and not  smoking.     Thomas C. Daleen Squibb, MD, [facility]  Electronically Signed    TCW/MedQ  DD: 10/19/2008  DT: 10/20/2008  Job #: [id number]   cc:   Jerl Mina

## 2011-05-08 NOTE — Procedures (Signed)
CAROTID DUPLEX EXAM   INDICATION:  Follow up carotid artery disease.   HISTORY:  Diabetes:  Yes.  Cardiac:  Yes.  Hypertension:  Yes.  Smoking:  Previous.  Previous Surgery:  No carotid surgery.  CV History:  Asymptomatic.  Amaurosis Fugax No, Paresthesias No, Hemiparesis No.                                       RIGHT             LEFT  Brachial systolic pressure:  Brachial Doppler waveforms:  Vertebral direction of flow:        Antegrade  DUPLEX VELOCITIES (cm/sec)  CCA peak systolic                   87  ECA peak systolic                   183  ICA peak systolic                   475  ICA end diastolic                   143  PLAQUE MORPHOLOGY:                  Calcific  PLAQUE AMOUNT:                      Severe  PLAQUE LOCATION:                    ICA/bifurcation   IMPRESSION:  1. Limited right-side-only study due to repeat study.  2. Right internal carotid artery shows evidence of 80% to 99%      stenosis.         ___________________________________________  Quita Skye Hart [place], M.D.   AS/MEDQ  D:  03/28/2010  T:  03/29/2010  Job:  [id number]

## 2011-05-08 NOTE — Assessment & Plan Note (Signed)
[facility] OFFICE NOTE   Miranda, Manning                           MRN:          [id number]  DATE:04/05/2009                            DOB:          04-Feb-1946    Miranda Manning comes in today for followup.   PROBLEM LIST:  1. History of severe mitral regurgitation, which is asymptomatic with      anterior mitral valve leaflet prolapse.  This echo was done at      [facility] in 2009.  2. Normal left ventricular systolic chamber size with an ejection      fraction of 50%.  3. Nonobstructive cerebrovascular disease.  4. Coronary artery disease with a stress Myoview on April 01, 2008.  5. Hypertension.  6. Type 2 diabetes.  7. Hyperlipidemia.  8. Left bundle-branch block.  9. History of remote tobacco.   She continues not to smoke.  Her WelChol has been discontinued and now  she is on Zetia along with simvastatin per Dr. Burnett Sheng at [facility].  The patient denies any orthopnea, PND, or peripheral edema  except in her right lower extremity which is from venous varicosities.  She has no major dyspnea on exertion.  She has had no angina.  There has  been no syncope.   MEDICATIONS:  1. Nadolol 20 mg p.o. daily.  2. Simvastatin 20 mg nightly.  3. Hydrochlorothiazide 25 mg per day.  4. Glipizide 5 mg per day.  5. Vitamin C.  6. Calcium 600 mg a day.  7. Vitamin E.  8. Enteric-coated aspirin 325 mg a day.  9. Fish oil 4 g per day.  10.Potassium daily.  11.Zetia 10 mg per day.  12.Fosamax.   PHYSICAL EXAMINATION:  VITAL SIGNS:  Her blood pressure is 142/72.  She  was in a rush when she came to the office and her heart rate is 54 and  regular.  Her electrocardiogram shows sinus rhythm with some first-  degree AV block and a left bundle which is stable.  Weight is 138, down  3.  HEENT:  Normal.  NECK:  Supple.  Carotid upstrokes were equal bilaterally with a high-  pitched bruit on the right, soft  bruit on the left.  Thyroid is not  enlarged.  Trachea is midline.  CHEST:  Lungs are clear to auscultation and percussion.  Reduced breath  sounds throughout.  HEART:  A paradoxically split S2, soft systolic murmur at the apex.  No  gallop.  PMI is nondisplaced.  ABDOMEN:  Soft, good bowel sounds.  No midline bruit.  No hepatomegaly.  EXTREMITIES:  There is no cyanosis or clubbing.  There is venous  varicosities in the anterior tibia on the right, trace edema.  Pulses  are present.  MUSCULOSKELETAL:  Chronic arthritic changes.  SKIN:  A few ecchymoses.   A 2-D echocardiogram repeated on March 08, 2009, showed mild to moderate  mitral regurgitation with an essentially jet posteriorly with prolapse  that was mild in the anterior leaflet.  She has some mild restriction of  the posterior leaflet.  Her left atrium is mildly dilated.  EF is 50-  60%.  Left ventricular chamber size was normal.  She had paradoxical  motion in ventricular septum consistent with her left bundle.   Her carotid Doppler showed increase in her carotid disease with a 60-79%  bilateral internal carotid artery stenoses with antegrade flow in both  vertebrals.   ASSESSMENT AND PLAN:  Miranda Manning is asymptomatic.  She is on excellent  medical program.  I have encouraged her to take Zetia with a progression  of her carotid disease to get her LDL as low as possible.  I have  commended her on not smoking.  We will plan on seeing her back in a  year.  At that time, she will need carotid Dopplers and 2-D echo.     Thomas C. Daleen Squibb, MD, [facility]  Electronically Signed    TCW/MedQ  DD: 04/05/2009  DT: 04/06/2009  Job #: [id number]   cc:   Miranda Manning

## 2011-05-08 NOTE — Assessment & Plan Note (Signed)
OFFICE VISIT   Miranda Manning, Miranda Manning  DOB:  1946-11-23                                       05/02/2010  WUJWJ#:[id number]   CHIEF COMPLAINT:  Retained stitch and status post right carotid  endarterectomy.   The patient is a  65 year old woman who had a right carotid  endarterectomy done on April 05, 2010 by Dr. Hart Rochester.  She has been doing  extremely well but complains of a stitch  at the top of the wound which  has come through the skin and she is here today to have it removed.  She  has had no headaches, signs of stroke, amaurosis fugax or any other  issues since her surgery and she has been doing very well.   PHYSICAL EXAMINATION:  She had a small knot of the stitch retained at  the upper pole of the wound.  This was removed without difficulty.  There is no redness, erythema or drainage.  Otherwise the wound was  healing extremely well.  She was alert and oriented x3.  She had good  and equal strength bilateral upper and lower extremities.  She had no  tongue deviation or facial droop.  Vital signs:  Her heart rate is 58.  Her saturations are 98% and respiratory rate was 10.   ASSESSMENT/PLAN:  Assessment is retained stitch  which was removed  without difficulty in the wall well-healed right carotid endarterectomy  wound.   Follow up per protocol.   Miranda Goo, PA-C   Quita Skye. Hart Rochester, M.D.  Electronically Signed   RR/MEDQ  D:  05/02/2010  T:  05/02/2010  Job:  [id number]

## 2011-05-08 NOTE — Assessment & Plan Note (Signed)
OFFICE VISIT   TAMA, GROSZ  DOB:  [birth year]                                       04/18/2010  NWGNF#:[id number]   The patient returns for initial evaluation following her right carotid  endarterectomy which I performed on April 13 for severe right internal  carotid stenosis which was asymptomatic.  She has mild contralateral  left internal carotid stenosis which we will follow.  She has done well  since her discharge with no difficulty swallowing or hoarseness.  Denies  any neurologic symptoms such as hemiparesis, aphasia, amaurosis fugax,  diplopia, blurred vision or syncope.  She is taking aspirin daily.   PHYSICAL EXAM:  Today blood pressure 167/68, heart rate 71, temperature  97.8.  Right neck incision is healing nicely.  Carotid pulse is 3+, no  audible bruits.  Neurologic exam is normal.  Chest clear to  auscultation.   I am very pleased with her early result and encouraged her to return to  her normal activity soon and return to work on May 9.  She will return  in 6 months for followup carotid duplex exam in our office.     Quita Skye Hart Rochester, M.D.  Electronically Signed   JDL/MEDQ  D:  04/18/2010  T:  04/19/2010  Job:  [id number]   cc:   Antonieta Iba, MD

## 2011-05-08 NOTE — Assessment & Plan Note (Signed)
[facility] OFFICE NOTE   Miranda Manning, Miranda Manning                           MRN:          [id number]  DATE:04/07/2008                            DOB:          12-14-1946    Ms. Miranda Manning comes back today for further follow-up concerning her dyspnea  on exertion.   She had a stress Myoview to rule out obstructive coronary disease.  It  showed an EF of 63% with no ischemia or infarction.  She had normal  contractility and thickening in all areas of the myocardium.   We note from outside records from that a recent echo showed anterior  mitral valve leaflet prolapse with severe mitral regurgitation.  Ejection fraction around 50%.  Her left ventricle was not enlarged and,  in fact, in diastole was 4.3 cm.  She had mild left atrial enlargement  of 4.6 cm.  She has moderate left ventricular hypertrophy with her  history of hypertension.  She had mild aortic insufficiency.   Carotid Dopplers showed moderate calcific plaque bilaterally.  She had a  50-75% proximal right internal carotid artery stenosis.   She has a left bundle.   I have had a long talk with her today.  At this point in time there is  no evidence of obstructive coronary disease though she does, I am sure,  have some coronary plaque.  She is on a good medical program for plaque  stabilization and, hopefully, decreasing plaque progression.  She also  quit smoking.   At this point will see her back every 6 months.  If she begins to have  any increase in her dyspnea on exertion or any exertional symptoms  whatsoever, angina or syncope/presyncope, she will let me know.     Thomas C. Daleen Squibb, MD, [facility]  Electronically Signed    TCW/MedQ  DD: 04/07/2008  DT: 04/07/2008  Job #: [id number]   cc:   Dr. Burnett Sheng

## 2011-06-04 ENCOUNTER — Encounter: Payer: Self-pay | Admitting: Cardiovascular Disease

## 2011-06-04 ENCOUNTER — Ambulatory Visit: Payer: Self-pay | Admitting: Family Medicine

## 2011-09-03 ENCOUNTER — Ambulatory Visit (INDEPENDENT_AMBULATORY_CARE_PROVIDER_SITE_OTHER): Payer: Medicare Other | Admitting: Cardiovascular Disease

## 2011-09-03 ENCOUNTER — Encounter: Payer: Self-pay | Admitting: Cardiovascular Disease

## 2011-09-03 DIAGNOSIS — E119 Type 2 diabetes mellitus without complications: Secondary | ICD-10-CM

## 2011-09-03 DIAGNOSIS — I6529 Occlusion and stenosis of unspecified carotid artery: Secondary | ICD-10-CM

## 2011-09-03 DIAGNOSIS — E785 Hyperlipidemia, unspecified: Secondary | ICD-10-CM

## 2011-09-03 DIAGNOSIS — I2510 Atherosclerotic heart disease of native coronary artery without angina pectoris: Secondary | ICD-10-CM

## 2011-09-03 DIAGNOSIS — I10 Essential (primary) hypertension: Secondary | ICD-10-CM

## 2011-09-03 NOTE — Patient Instructions (Addendum)
You are doing well. No medication changes were made. Please check your blood pressure and heart rate daily. Please call us if you have new issues that need to be addressed before your next appt.  We will call you for a follow up Appt. In 6 months  Need to have a carotid u/s for carotid stenosis.

## 2011-09-03 NOTE — Progress Notes (Signed)
Patient ID: Miranda Manning, female    DOB: Dec 06, 1946, 65 y.o.   MRN: [id number]  HPI Comments: Miranda Manning is a 65 year old woman with history of severe carotid arterial disease, coronary artery disease, mild to moderate mitral regurgitation with mild prolapse, mild pulmonary hypertension,hyperlipidemia, hypertension and diabetes who presents for routine followup. she had a right carotid endarterectomy on April 05 2010.   carotid arterial study dated 03/07/10 shows 80-99% right proximal internal carotid arterial disease, 60-79% disease noted on the left (suspect low end of the range).   she is  retired from [employer], feels well and has no complaints.. she is active and has no chest discomfort, shortness of breath, lightheadedness near syncope.She states that she takes simvastatin 40 mg daily. She reports having problems on Lipitor and Crestor but she's not sure of the doses. No lightheaded spells or dizziness. "I have always been on nadolol". "I was on two, now one because my BP was low."   Last stress test was April 2009 which showed no significant ischemia.   EKG shows normal sinus rhythm with rate 66 beats per minute, left bundle branch block   Cholesterol 12/2010 is 172,  LDL 100, HDL 47, triglycerides 100, hemoglobin A1c 6.6     Outpatient Encounter Prescriptions as of 09/03/2011  Medication Sig Dispense Refill  . ascorbic Acid (VITAMIN C) 500 MG CPCR Take 500 mg by mouth daily.        Marland Kitchen aspirin 325 MG EC tablet Take 325 mg by mouth daily.        . Calcium Carbonate (CALTRATE 600) 1500 MG TABS Take 1 tablet by mouth daily.        . Cholecalciferol 1000 UNITS capsule Take 1,000 Units by mouth daily.        . Cranberry-Vitamin C-Vitamin E (CRANBERRY PLUS VITAMIN C) 4200-20-3 MG-MG-UNIT CAPS Take by mouth daily.        . fish oil-omega-3 fatty acids 1000 MG capsule Take 2 g by mouth daily.        Marland Kitchen glipiZIDE (GLUCOTROL) 5 MG 24 hr tablet Take 5 mg by mouth daily.        . hydrochlorothiazide 25  MG tablet Take 25 mg by mouth daily.        Marland Kitchen LORazepam (ATIVAN) 0.5 MG tablet Take 0.5 mg by mouth 3 (three) times daily as needed.        . nadolol (CORGARD) 40 MG tablet Take 20 mg by mouth daily.        . potassium chloride (KLOR-CON) 10 MEQ CR tablet Take 10 mEq by mouth daily.        . simvastatin (ZOCOR) 40 MG tablet Take 40 mg by mouth at bedtime.        . vitamin E 1000 UNIT capsule Take 1,000 Units by mouth daily.            Review of Systems  Constitutional: Negative.   HENT: Negative.   Eyes: Negative.   Respiratory: Negative.   Cardiovascular: Negative.   Gastrointestinal: Negative.   Musculoskeletal: Negative.   Skin: Negative.   Neurological: Negative.   Hematological: Negative.   Psychiatric/Behavioral: Negative.   All other systems reviewed and are negative.    BP 168/71  Pulse 43  Ht 5' 4.5" (1.638 m)  Wt 140 lb (63.504 kg)  BMI 23.66 kg/m2  Physical Exam  Nursing note and vitals reviewed. Constitutional: She is oriented to person, place, and time. She appears well-developed and well-nourished.  HENT:  Head: Normocephalic.  Nose: Nose normal.  Mouth/Throat: Oropharynx is clear and moist.  Eyes: Conjunctivae are normal. Pupils are equal, round, and reactive to light.  Neck: Normal range of motion. Neck supple. No JVD present.  Cardiovascular: Normal rate, regular rhythm, S1 normal, S2 normal, normal heart sounds and intact distal pulses.  Exam reveals no gallop and no friction rub.   No murmur heard. Pulmonary/Chest: Effort normal and breath sounds normal. No respiratory distress. She has no wheezes. She has no rales. She exhibits no tenderness.  Abdominal: Soft. Bowel sounds are normal. She exhibits no distension. There is no tenderness.  Musculoskeletal: Normal range of motion. She exhibits no edema and no tenderness.  Lymphadenopathy:    She has no cervical adenopathy.  Neurological: She is alert and oriented to person, place, and time. Coordination  normal.  Skin: Skin is warm and dry. No rash noted. No erythema.  Psychiatric: She has a normal mood and affect. Her behavior is normal. Judgment and thought content normal.         Assessment and Plan

## 2011-09-03 NOTE — Assessment & Plan Note (Signed)
We will set her up for a carotid ultrasound at the end of the year. History of CEA on the right.

## 2011-09-03 NOTE — Assessment & Plan Note (Addendum)
Blood pressure is elevated. Heart rate is low. She is asymptomatic from her low heart rate. We have asked her to monitor her blood pressure at home and provide [place] with numbers over the next 2 weeks, either calling them in or dropping off the numbers in the office. As she has diabetes, she may benefit from an ACE inhibitor.

## 2011-09-03 NOTE — Assessment & Plan Note (Signed)
We have encouraged continued exercise, careful diet management in an effort to lose weight. 

## 2011-09-03 NOTE — Assessment & Plan Note (Signed)
Cholesterol at the beginning of the year was not at goal. We will check her cholesterol again today before making any decisions. She had increased her simvastatin from 30 mg to 40 mg. I suspect we will need to add zetia.

## 2011-09-03 NOTE — Assessment & Plan Note (Signed)
Currently with no symptoms of angina. No further workup at this time. Continue current medication regimen. 

## 2011-09-04 LAB — HEPATIC FUNCTION PANEL
ALT: 11 U/L (ref 0–35)
AST: 16 U/L (ref 0–37)
Bilirubin, Direct: 0.1 mg/dL (ref 0.0–0.3)
Indirect Bilirubin: 0.5 mg/dL (ref 0.0–0.9)
Total Protein: 6.6 g/dL (ref 6.0–8.3)

## 2011-09-04 LAB — LIPID PANEL
LDL Cholesterol: 106 mg/dL — ABNORMAL HIGH (ref 0–99)
Triglycerides: 119 mg/dL (ref <150)
VLDL: 24 mg/dL (ref 0–40)

## 2011-10-02 ENCOUNTER — Encounter (INDEPENDENT_AMBULATORY_CARE_PROVIDER_SITE_OTHER): Payer: Medicare Other | Admitting: *Deleted

## 2011-10-02 DIAGNOSIS — I6529 Occlusion and stenosis of unspecified carotid artery: Secondary | ICD-10-CM

## 2011-10-06 ENCOUNTER — Encounter: Payer: Self-pay | Admitting: Cardiovascular Disease

## 2012-02-26 ENCOUNTER — Encounter: Payer: Self-pay | Admitting: *Deleted

## 2012-02-27 ENCOUNTER — Ambulatory Visit (INDEPENDENT_AMBULATORY_CARE_PROVIDER_SITE_OTHER): Payer: Medicare Other | Admitting: Cardiovascular Disease

## 2012-02-27 ENCOUNTER — Encounter: Payer: Self-pay | Admitting: Cardiovascular Disease

## 2012-02-27 DIAGNOSIS — I080 Rheumatic disorders of both mitral and aortic valves: Secondary | ICD-10-CM

## 2012-02-27 DIAGNOSIS — E785 Hyperlipidemia, unspecified: Secondary | ICD-10-CM

## 2012-02-27 DIAGNOSIS — I2510 Atherosclerotic heart disease of native coronary artery without angina pectoris: Secondary | ICD-10-CM

## 2012-02-27 DIAGNOSIS — I6529 Occlusion and stenosis of unspecified carotid artery: Secondary | ICD-10-CM

## 2012-02-27 DIAGNOSIS — E119 Type 2 diabetes mellitus without complications: Secondary | ICD-10-CM

## 2012-02-27 DIAGNOSIS — I10 Essential (primary) hypertension: Secondary | ICD-10-CM

## 2012-02-27 MED ORDER — ATORVASTATIN CALCIUM 80 MG PO TABS: 80.0000 mg | ORAL_TABLET | Freq: Every day | ORAL | Status: DC

## 2012-02-27 NOTE — Assessment & Plan Note (Signed)
Currently with no symptoms of angina. No further workup at this time. Continue current medication regimen. 

## 2012-02-27 NOTE — Assessment & Plan Note (Signed)
Cholesterol is above goal. We will change her simvastatin to Lipitor 80 mg daily.

## 2012-02-27 NOTE — Assessment & Plan Note (Signed)
60% carotid disease on the left, status post CEA on the right. Continue aggressive cholesterol management. Repeat ultrasound later this year.

## 2012-02-27 NOTE — Progress Notes (Signed)
Patient ID: Miranda Manning, female    DOB: 06-19-1946, 66 y.o.   MRN: [id number]  HPI Comments: Miranda Manning is a 66 year old woman with history of severe carotid arterial disease, coronary artery disease, mild to moderate mitral regurgitation with  prolapse, mild pulmonary hypertension,hyperlipidemia, hypertension and diabetes who presents for routine followup. she had a right carotid endarterectomy on April 05 2010,  60-79% disease noted on the left (low end of the range).   she is  retired from [employer], feels well and has no complaints. she is active and has no chest discomfort, shortness of breath, lightheadedness near syncope.She states that she takes simvastatin 40 mg daily. She reports having problems on Lipitor and Crestor but she's not sure of the doses. No lightheaded spells or dizziness.   Last stress test was April 2009 which showed no significant ischemia.   EKG shows normal sinus rhythm with rate 58 beats per minute, left bundle branch block   Labs from February 2013:  Cholesterol 189,  LDL 103, HDL 52, triglycerides 165, hemoglobin A1c 6.3     Outpatient Encounter Prescriptions as of 02/27/2012  Medication Sig Dispense Refill  . ascorbic Acid (VITAMIN C) 500 MG CPCR Take 500 mg by mouth daily.        Marland Kitchen aspirin 325 MG EC tablet Take 325 mg by mouth daily.        . Calcium Carbonate (CALTRATE 600) 1500 MG TABS Take 1 tablet by mouth daily.        . Cranberry-Vitamin C-Vitamin E (CRANBERRY PLUS VITAMIN C) 4200-20-3 MG-MG-UNIT CAPS Take by mouth daily.        . fish oil-omega-3 fatty acids 1000 MG capsule Take 2 g by mouth daily.        Marland Kitchen glipiZIDE (GLUCOTROL) 5 MG 24 hr tablet Take 5 mg by mouth daily.        . hydrochlorothiazide 25 MG tablet Take 25 mg by mouth daily.        Marland Kitchen LORazepam (ATIVAN) 0.5 MG tablet Take 0.5 mg by mouth 3 (three) times daily as needed.        . nadolol (CORGARD) 40 MG tablet Take 20 mg by mouth daily.        . potassium chloride (KLOR-CON) 10 MEQ CR tablet  Take 10 mEq by mouth daily.        . vitamin E 1000 UNIT capsule Take 1,000 Units by mouth daily.        .  simvastatin (ZOCOR) 40 MG tablet Take 40 mg by mouth at bedtime.         Review of Systems  Constitutional: Negative.   HENT: Negative.   Eyes: Negative.   Respiratory: Negative.   Cardiovascular: Negative.   Gastrointestinal: Negative.   Musculoskeletal: Negative.   Skin: Negative.   Neurological: Negative.   Hematological: Negative.   Psychiatric/Behavioral: Negative.   All other systems reviewed and are negative.    BP 126/68  Pulse 58  Ht 5\' 1"  (1.549 m)  Wt 141 lb (63.957 kg)  BMI 26.64 kg/m2  Physical Exam  Nursing note and vitals reviewed. Constitutional: She is oriented to person, place, and time. She appears well-developed and well-nourished.  HENT:  Head: Normocephalic.  Nose: Nose normal.  Mouth/Throat: Oropharynx is clear and moist.  Eyes: Conjunctivae are normal. Pupils are equal, round, and reactive to light.  Neck: Normal range of motion. Neck supple. No JVD present. Carotid bruit is present.  Cardiovascular: Normal rate, regular  rhythm, S1 normal, S2 normal, normal heart sounds and intact distal pulses.  Exam reveals no gallop and no friction rub.   No murmur heard. Pulmonary/Chest: Effort normal and breath sounds normal. No respiratory distress. She has no wheezes. She has no rales. She exhibits no tenderness.  Abdominal: Soft. Bowel sounds are normal. She exhibits no distension. There is no tenderness.  Musculoskeletal: Normal range of motion. She exhibits no edema and no tenderness.  Lymphadenopathy:    She has no cervical adenopathy.  Neurological: She is alert and oriented to person, place, and time. Coordination normal.  Skin: Skin is warm and dry. No rash noted. No erythema.  Psychiatric: She has a normal mood and affect. Her behavior is normal. Judgment and thought content normal.         Assessment and Plan

## 2012-02-27 NOTE — Patient Instructions (Addendum)
You are doing well. Please stop simvastatin. Start lipitor 80 mg once a day  Please call in three months for lab work  Please call us if you have new issues that need to be addressed before your next appt.  Your physician wants you to follow-up in: 6 months.  You will receive a reminder letter in the mail two months in advance. If you don't receive a letter, please call our office to schedule the follow-up appointment.

## 2012-02-27 NOTE — Assessment & Plan Note (Signed)
Blood pressure is well controlled on today's visit. No changes made to the medications. 

## 2012-02-27 NOTE — Assessment & Plan Note (Signed)
We have encouraged continued exercise, careful diet management in an effort to lose weight. 

## 2012-02-28 ENCOUNTER — Other Ambulatory Visit: Payer: Self-pay | Admitting: Cardiovascular Disease

## 2012-02-28 MED ORDER — ATORVASTATIN CALCIUM 80 MG PO TABS: 80.0000 mg | ORAL_TABLET | Freq: Every day | ORAL | Status: DC

## 2012-02-28 NOTE — Telephone Encounter (Signed)
Pt needs new script sent to [facility]

## 2012-04-08 ENCOUNTER — Telehealth: Payer: Self-pay | Admitting: Cardiovascular Disease

## 2012-04-08 NOTE — Telephone Encounter (Signed)
Cholesterol on Zocor 40 mg daily was not good enough Does She want to try Lipitor 80 mg daily I understand that Vytorin and zetia may be too expensive Zocor is in vytorin  If she does not want Lipitor, would go back on Zocor 40 mg daily

## 2012-04-08 NOTE — Telephone Encounter (Signed)
Do you want patient to take zocor?

## 2012-04-08 NOTE — Telephone Encounter (Signed)
Pt was given vytorin and could not take it due to body aches and stopped it and wants to go back on zocor. Pt wants to know if Dr Mariah Milling can call in a script for the zocor. Send to [facility]

## 2012-04-09 MED ORDER — SIMVASTATIN 40 MG PO TABS: 40.0000 mg | ORAL_TABLET | Freq: Every evening | ORAL | Status: DC

## 2012-04-09 NOTE — Telephone Encounter (Signed)
LMOM to call office.  

## 2012-04-09 NOTE — Telephone Encounter (Signed)
Pt was notified, became angry.  She states she cannot take vytorin or lipitor and is only agreeable with staying on the zocor.  Zocor was refilled.

## 2012-05-05 ENCOUNTER — Other Ambulatory Visit: Payer: Self-pay | Admitting: Cardiology

## 2012-05-05 ENCOUNTER — Telehealth: Payer: Self-pay

## 2012-05-05 DIAGNOSIS — I6529 Occlusion and stenosis of unspecified carotid artery: Secondary | ICD-10-CM

## 2012-05-05 NOTE — Telephone Encounter (Signed)
The patient is scheduled for a carotid u/s this Thursday, May 08, 2012. The patient would like to know if she needs to have an Echo for her Mitral valve disorder? Please advise if I need to put this order in for the patient?

## 2012-05-06 NOTE — Telephone Encounter (Signed)
Pt was confused and was thinking the echo showed the amount of stenosis in her carotids.  I told her that the carotid doppler that she is scheduled for on Thursday would show this.  She will call back if she wants to have an echo done.

## 2012-05-06 NOTE — Telephone Encounter (Signed)
Last echo was in 2011 On her last clinic visit this year, she seemed to be okay with no new symptoms. Certainly if she has any shortness of breath or symptoms, we can check an echocardiogram. She had moderate mitral valve regurgitation in 2011 from prolapse.   Okay to put order and if she would like this done. It would probably be on a different day from the carotid. Different sonographer.

## 2012-05-15 ENCOUNTER — Encounter (INDEPENDENT_AMBULATORY_CARE_PROVIDER_SITE_OTHER): Payer: Medicare Other

## 2012-05-15 DIAGNOSIS — I6529 Occlusion and stenosis of unspecified carotid artery: Secondary | ICD-10-CM

## 2012-05-26 ENCOUNTER — Other Ambulatory Visit: Payer: Medicare Other

## 2012-06-09 ENCOUNTER — Ambulatory Visit: Payer: Self-pay | Admitting: Family Medicine

## 2012-09-02 ENCOUNTER — Encounter: Payer: Self-pay | Admitting: Internal Medicine

## 2012-10-08 ENCOUNTER — Ambulatory Visit (AMBULATORY_SURGERY_CENTER): Payer: Medicare Other | Admitting: *Deleted

## 2012-10-08 VITALS — Ht 60.0 in | Wt 141.6 lb

## 2012-10-08 DIAGNOSIS — Z1211 Encounter for screening for malignant neoplasm of colon: Secondary | ICD-10-CM

## 2012-10-08 MED ORDER — NA SULFATE-K SULFATE-MG SULF 17.5-3.13-1.6 GM/177ML PO SOLN: ORAL | Status: DC

## 2012-10-20 ENCOUNTER — Telehealth: Payer: Self-pay | Admitting: Cardiovascular Disease

## 2012-10-20 NOTE — Telephone Encounter (Signed)
Pt calling states that she is having some dizzy spells especially at night when she gets up to go to the bathroom.

## 2012-10-20 NOTE — Telephone Encounter (Signed)
Pt says she and husband went to beach last week. She began having dizzy spells. She says she would lie in bed at night and would feel as if the "room was spinning".  She says this has continued at home and is now associated with dizziness when she gets up to go to bathroom in middle of night.  She has not fallen. Says BP=119/61. She does not have a known hx of inner ear issues, but says her mother had this. Pt confirms compliance with HCTZ and nadolol I advised pt to continue to monitor BP at home and to try holding HCTZ tomm and Wednesday and let us know Thursday how she feels.  Understanding verb.

## 2012-10-22 ENCOUNTER — Encounter: Payer: Self-pay | Admitting: Internal Medicine

## 2012-10-22 ENCOUNTER — Ambulatory Visit (AMBULATORY_SURGERY_CENTER): Payer: Medicare Other | Admitting: Internal Medicine

## 2012-10-22 VITALS — BP 154/54 | HR 65 | Temp 97.5°F | Resp 17 | Ht 60.0 in | Wt 141.0 lb

## 2012-10-22 DIAGNOSIS — Z1211 Encounter for screening for malignant neoplasm of colon: Secondary | ICD-10-CM

## 2012-10-22 DIAGNOSIS — D126 Benign neoplasm of colon, unspecified: Secondary | ICD-10-CM

## 2012-10-22 DIAGNOSIS — K648 Other hemorrhoids: Secondary | ICD-10-CM

## 2012-10-22 DIAGNOSIS — K5730 Diverticulosis of large intestine without perforation or abscess without bleeding: Secondary | ICD-10-CM

## 2012-10-22 LAB — GLUCOSE, CAPILLARY: Glucose-Capillary: 117 mg/dL — ABNORMAL HIGH (ref 70–99)

## 2012-10-22 MED ORDER — SODIUM CHLORIDE 0.9 % IV SOLN: 500.0000 mL | INTRAVENOUS | Status: DC

## 2012-10-22 NOTE — Op Note (Signed)
[facility] [address] [place], [address]   COLONOSCOPY PROCEDURE REPORT  PATIENT: Miranda Manning, Miranda Manning  MR#: [id number] BIRTHDATE: Sep 07, 1946 , 66  yrs. old GENDER: Female ENDOSCOPIST: Iva Boop, MD, [facility] REFERRED XB:MWUXL Hedrick, M.D. PROCEDURE DATE:  10/22/2012 PROCEDURE:   Colonoscopy with snare polypectomy ASA CLASS:   Class III INDICATIONS:average risk screening. MEDICATIONS: propofol (Diprivan) 250mg  IV, MAC sedation, administered by CRNA, and These medications were titrated to patient response per physician's verbal order  DESCRIPTION OF PROCEDURE:   After the risks benefits and alternatives of the procedure were thoroughly explained, informed consent was obtained.  A digital rectal exam revealed no abnormalities of the rectum.   The LB [id number]-[id number]  endoscope was introduced through the anus and advanced to the cecum, which was identified by both the appendix and ileocecal valve. No adverse events experienced.   The quality of the prep was Suprep excellent The instrument was then slowly withdrawn as the colon was fully examined.      COLON FINDINGS: Two smooth flat polyps measuring 10 and 15 mm in size were found at the cecum and in the ascending colon.  A polypectomy was performed with a cold snare(81mm) and using snare cautery (15mm).  The resection was complete and the polyp tissue was completely retrieved.   Severe diverticulosis was noted throughout the entire examined colon.   Moderate sized internal hemorrhoids were found.   The colon mucosa was otherwise normal. Retroflexed views revealed internal hemorrhoids. The time to cecum=3 minutes 42 seconds.  Withdrawal time=15 minutes 49 seconds. The scope was withdrawn and the procedure completed. COMPLICATIONS: There were no complications.  ENDOSCOPIC IMPRESSION: 1.   Two flat polyps measuring 10-15 mm in size were found at the cecum and in the ascending colon; polypectomy was  performed with a cold snare and using snare cautery 2.   Severe diverticulosis was noted throughout the entire examined colon 3.   Moderate sized internal hemorrhoids 4.   The colon mucosa was otherwise normal , excellent prep  RECOMMENDATIONS: 1.  Hold aspirin, aspirin products, and anti-inflammatory medication for 2 weeks. 2.  Timing of repeat colonoscopy will be determined by pathology findings.  eSigned:  Iva Boop, MD, [facility] 10/22/2012 10:52 AM   cc: Jerl Mina, MD and The Patient

## 2012-10-22 NOTE — Progress Notes (Signed)
Patient did not have preoperative order for IV antibiotic SSI prophylaxis. (G8918)  Patient did not experience any of the following events: a burn prior to discharge; a fall within the facility; wrong site/side/patient/procedure/implant event; or a hospital transfer or hospital admission upon discharge from the facility. (G8907)  

## 2012-10-22 NOTE — Progress Notes (Signed)
Pt. cbg 73 on admission to recovery no s/s of hypoglycemia noted , pt. Stated feel okay. Juice and saltine crackers given to pt. Prior to discharge cbg rechecked =117. Pt. Stable upon discharge.

## 2012-10-22 NOTE — Patient Instructions (Addendum)
Two polyps were removed. They look benign. i will send a letter about this and when to have another routine colonoscopy. You also have diverticulosis and internal hemorrhoids.  Thank you for choosing me and [facility].  Iva Boop, MD, FACG  YOU HAD AN ENDOSCOPIC PROCEDURE TODAY AT THE  ENDOSCOPY CENTER: Refer to the procedure report that was given to you for any specific questions about what was found during the examination.  If the procedure report does not answer your questions, please call your gastroenterologist to clarify.  If you requested that your care partner not be given the details of your procedure findings, then the procedure report has been included in a sealed envelope for you to review at your convenience later.  YOU SHOULD EXPECT: Some feelings of bloating in the abdomen. Passage of more gas than usual.  Walking can help get rid of the air that was put into your GI tract during the procedure and reduce the bloating. If you had a lower endoscopy (such as a colonoscopy or flexible sigmoidoscopy) you may notice spotting of blood in your stool or on the toilet paper. If you underwent a bowel prep for your procedure, then you may not have a normal bowel movement for a few days.  DIET: Your first meal following the procedure should be a light meal and then it is ok to progress to your normal diet.  A half-sandwich or bowl of soup is an example of a good first meal.  Heavy or fried foods are harder to digest and may make you feel nauseous or bloated.  Likewise meals heavy in dairy and vegetables can cause extra gas to form and this can also increase the bloating.  Drink plenty of fluids but you should avoid alcoholic beverages for 24 hours.  ACTIVITY: Your care partner should take you home directly after the procedure.  You should plan to take it easy, moving slowly for the rest of the day.  You can resume normal activity the day after the procedure however you  should NOT DRIVE or use heavy machinery for 24 hours (because of the sedation medicines used during the test).    SYMPTOMS TO REPORT IMMEDIATELY: A gastroenterologist can be reached at any hour.  During normal business hours, 8:30 AM to 5:00 PM Monday through Friday, call [phone].  After hours and on weekends, please call the GI answering service at [phone] who will take a message and have the physician on call contact you.   Following lower endoscopy (colonoscopy or flexible sigmoidoscopy):  Excessive amounts of blood in the stool  Significant tenderness or worsening of abdominal pains  Swelling of the abdomen that is new, acute  Fever of 100F or higher  FOLLOW UP: If any biopsies were taken you will be contacted by phone or by letter within the next 1-3 weeks.  Call your gastroenterologist if you have not heard about the biopsies in 3 weeks.  Our staff will call the home number listed on your records the next business day following your procedure to check on you and address any questions or concerns that you may have at that time regarding the information given to you following your procedure. This is a courtesy call and so if there is no answer at the home number and we have not heard from you through the emergency physician on call, we will assume that you have returned to your regular daily activities without incident.  SIGNATURES/CONFIDENTIALITY: You and/or your care  partner have signed paperwork which will be entered into your electronic medical record.  These signatures attest to the fact that that the information above on your After Visit Summary has been reviewed and is understood.  Full responsibility of the confidentiality of this discharge information lies with you and/or your care-partner.    Hold aspirin,aspirin products,and anti-inflammatory medications for 2 weeks,Resume all other medications.Information given on polyps,diverticulosis and high fiber diet with  discharge instructions.

## 2012-10-23 ENCOUNTER — Telehealth: Payer: Self-pay | Admitting: *Deleted

## 2012-10-23 NOTE — Telephone Encounter (Signed)
Please see below as to what I told pt 

## 2012-10-23 NOTE — Telephone Encounter (Signed)
I called to check on pt. She says since holding HCTZ she has had no further dizzy spells. She had colonoscopy yesterday and BP was 144/80, HR=63 just prior to procedure. She has not checked BP today. I advised her, since symptoms have improved off HCTZ, to continue holding this med until appt with Dr. Joaquin Bend I can discuss with him upon his return from vacation.  I advised her to continue to monitor BP at home and to let us know should her systolic BP's consistently stay above 140 mm hg. Understanding verb.  She denies sob or edema. She has appt with PCP next week.

## 2012-10-23 NOTE — Telephone Encounter (Signed)
  Follow up Call-  Call back number 10/22/2012  Post procedure Call Back phone  # [phone]  Permission to leave phone message Yes     Patient questions:  Do you have a fever, pain , or abdominal swelling? no Pain Score  0 *  Have you tolerated food without any problems? yes  Have you been able to return to your normal activities? yes  Do you have any questions about your discharge instructions: Diet   yes Medications  no Follow up visit  no  Do you have questions or concerns about your Care? no  Actions: * If pain score is 4 or above: No action needed, pain <4.

## 2012-10-26 NOTE — Telephone Encounter (Signed)
Sounds like vertigo if having sx lying down,  Less likely hctz ok to monitor BP for now

## 2012-10-28 ENCOUNTER — Encounter: Payer: Self-pay | Admitting: Internal Medicine

## 2012-10-28 DIAGNOSIS — Z860100 Personal history of colon polyps, unspecified: Secondary | ICD-10-CM | POA: Insufficient documentation

## 2012-10-28 NOTE — Progress Notes (Signed)
Quick Note:  10 and 15 mm hyperplastic polyps from right colon Repeat colonoscopy 5 years - 10/2017 ______

## 2012-11-14 ENCOUNTER — Ambulatory Visit (INDEPENDENT_AMBULATORY_CARE_PROVIDER_SITE_OTHER): Payer: Medicare Other | Admitting: Cardiovascular Disease

## 2012-11-14 ENCOUNTER — Encounter: Payer: Self-pay | Admitting: Cardiovascular Disease

## 2012-11-14 VITALS — BP 140/70 | HR 59 | Ht 60.0 in | Wt 140.2 lb

## 2012-11-14 DIAGNOSIS — E785 Hyperlipidemia, unspecified: Secondary | ICD-10-CM

## 2012-11-14 DIAGNOSIS — I6529 Occlusion and stenosis of unspecified carotid artery: Secondary | ICD-10-CM

## 2012-11-14 DIAGNOSIS — I080 Rheumatic disorders of both mitral and aortic valves: Secondary | ICD-10-CM

## 2012-11-14 DIAGNOSIS — I10 Essential (primary) hypertension: Secondary | ICD-10-CM

## 2012-11-14 DIAGNOSIS — E119 Type 2 diabetes mellitus without complications: Secondary | ICD-10-CM

## 2012-11-14 NOTE — Progress Notes (Signed)
Patient ID: Miranda Manning, female    DOB: [birth year], 66 y.o.   MRN: [id number]  HPI Comments: Miranda Manning is a 66 year old woman with history of severe carotid arterial disease, coronary artery disease, previous smoking history of 20 years, stopped in 1990, mild to moderate mitral regurgitation with  prolapse, mild pulmonary hypertension,hyperlipidemia, hypertension and diabetes who presents for routine followup. she had a right carotid endarterectomy on April 05 2010,  40 - 59% disease noted on the left  She reports recent colonoscopy with numerous diverticuli   she is  retired from the bank, feels well and has no complaints. she is active and has no chest discomfort, shortness of breath, lightheadedness near syncope.She states that she takes simvastatin 40 mg daily. She reports having problems on Lipitor and Crestor. She reports recent benign positional vertigo, requiring evaluation and treatment by [facility]. Currently feels well with no further complaints. She is helping to take care of her husband who has frequent COPD flares. Recent lab work last month not available. This has been requested Last stress test was April 2009 which showed no significant ischemia.   EKG shows normal sinus rhythm with rate 59 beats per minute, left bundle branch block   Labs from February 2013:  Cholesterol 189,  LDL 103, HDL 52, triglycerides 165, hemoglobin A1c 6.3     Outpatient Encounter Prescriptions as of 11/14/2012  Medication Sig Dispense Refill  . ascorbic Acid (VITAMIN C) 500 MG CPCR Take 500 mg by mouth daily.        Marland Kitchen aspirin 325 MG EC tablet Take 325 mg by mouth daily.        . Calcium Carbonate (CALTRATE 600) 1500 MG TABS Take 1 tablet by mouth daily.        . clobetasol ointment (TEMOVATE) 0.05 % Apply 1 application topically 2 (two) times daily.       . Cranberry-Vitamin C-Vitamin E (CRANBERRY PLUS VITAMIN C) 4200-20-3 MG-MG-UNIT CAPS Take by mouth daily.        . fish oil-omega-3 fatty acids  1000 MG capsule Take 2 g by mouth daily.        Marland Kitchen glipiZIDE (GLUCOTROL) 5 MG 24 hr tablet Take 5 mg by mouth 2 (two) times daily.       . hydrochlorothiazide 25 MG tablet Take 25 mg by mouth daily.        Marland Kitchen LORazepam (ATIVAN) 0.5 MG tablet Take 0.5 mg by mouth daily.       . Multiple Vitamin (MULTIVITAMIN) tablet Take 1 tablet by mouth daily.      . nadolol (CORGARD) 40 MG tablet Take 20 mg by mouth daily.        . potassium chloride (KLOR-CON) 10 MEQ CR tablet Take 10 mEq by mouth daily.        . simvastatin (ZOCOR) 40 MG tablet Take 1 tablet (40 mg total) by mouth every evening.  90 tablet  3  . vitamin E 1000 UNIT capsule Take 1,000 Units by mouth daily.          Review of Systems  Constitutional: Negative.   HENT: Negative.   Eyes: Negative.   Respiratory: Negative.   Cardiovascular: Negative.   Gastrointestinal: Negative.   Musculoskeletal: Negative.   Skin: Negative.   Neurological: Negative.   Hematological: Negative.   Psychiatric/Behavioral: Negative.   All other systems reviewed and are negative.    BP 140/70  Ht 5' (1.524 m)  Wt 140 lb 4 oz (63.617  kg)  BMI 27.39 kg/m2  Physical Exam  Nursing note and vitals reviewed. Constitutional: She is oriented to person, place, and time. She appears well-developed and well-nourished.  HENT:  Head: Normocephalic.  Nose: Nose normal.  Mouth/Throat: Oropharynx is clear and moist.  Eyes: Conjunctivae normal are normal. Pupils are equal, round, and reactive to light.  Neck: Normal range of motion. Neck supple. No JVD present. Carotid bruit is present.  Cardiovascular: Normal rate, regular rhythm, S1 normal, S2 normal, normal heart sounds and intact distal pulses.  Exam reveals no gallop and no friction rub.   No murmur heard. Pulmonary/Chest: Effort normal and breath sounds normal. No respiratory distress. She has no wheezes. She has no rales. She exhibits no tenderness.  Abdominal: Soft. Bowel sounds are normal. She exhibits  no distension. There is no tenderness.  Musculoskeletal: Normal range of motion. She exhibits no edema and no tenderness.  Lymphadenopathy:    She has no cervical adenopathy.  Neurological: She is alert and oriented to person, place, and time. Coordination normal.  Skin: Skin is warm and dry. No rash noted. No erythema.  Psychiatric: She has a normal mood and affect. Her behavior is normal. Judgment and thought content normal.         Assessment and Plan

## 2012-11-14 NOTE — Assessment & Plan Note (Signed)
Goal LDL less than 70. We have requested most recent lipid panel. If cholesterol continues to be elevated as seen earlier in 2013, we could consider adding zetia. Also could consider WelChol, fenofibrate.

## 2012-11-14 NOTE — Assessment & Plan Note (Signed)
We have encouraged continued exercise, careful diet management in an effort to lose weight. 

## 2012-11-14 NOTE — Assessment & Plan Note (Signed)
50-59% on the left, carotid endarterectomy on the right. Repeat ultrasound in one year. Continue aggressive lipid management.

## 2012-11-14 NOTE — Patient Instructions (Addendum)
You are doing well. No medication changes were made.  Please call us if you have new issues that need to be addressed before your next appt.  Your physician wants you to follow-up in: 6 months.  You will receive a reminder letter in the mail two months in advance. If you don't receive a letter, please call our office to schedule the follow-up appointment.   

## 2012-11-14 NOTE — Assessment & Plan Note (Signed)
No clinical signs of heart failure. Continue current medications.

## 2012-12-18 ENCOUNTER — Encounter: Payer: Self-pay | Admitting: Vascular Surgery

## 2013-05-19 ENCOUNTER — Encounter: Payer: Self-pay | Admitting: Cardiovascular Disease

## 2013-05-19 ENCOUNTER — Ambulatory Visit (INDEPENDENT_AMBULATORY_CARE_PROVIDER_SITE_OTHER): Payer: Medicare Other | Admitting: Cardiovascular Disease

## 2013-05-19 VITALS — BP 140/84 | HR 60 | Ht 60.0 in | Wt 147.8 lb

## 2013-05-19 DIAGNOSIS — I340 Nonrheumatic mitral (valve) insufficiency: Secondary | ICD-10-CM | POA: Insufficient documentation

## 2013-05-19 DIAGNOSIS — I6529 Occlusion and stenosis of unspecified carotid artery: Secondary | ICD-10-CM

## 2013-05-19 DIAGNOSIS — E119 Type 2 diabetes mellitus without complications: Secondary | ICD-10-CM

## 2013-05-19 DIAGNOSIS — E785 Hyperlipidemia, unspecified: Secondary | ICD-10-CM

## 2013-05-19 DIAGNOSIS — I059 Rheumatic mitral valve disease, unspecified: Secondary | ICD-10-CM

## 2013-05-19 DIAGNOSIS — I10 Essential (primary) hypertension: Secondary | ICD-10-CM

## 2013-05-19 DIAGNOSIS — I2510 Atherosclerotic heart disease of native coronary artery without angina pectoris: Secondary | ICD-10-CM

## 2013-05-19 MED ORDER — EZETIMIBE 10 MG PO TABS: 10.0000 mg | ORAL_TABLET | Freq: Every day | ORAL | Status: DC

## 2013-05-19 NOTE — Assessment & Plan Note (Signed)
Regurgitation seen on echocardiogram several years ago, moderate. Prolapse seen No symptoms of heart failure on today's visit. Doing well on HCTZ alone

## 2013-05-19 NOTE — Assessment & Plan Note (Signed)
We have encouraged continued exercise, careful diet management in an effort to lose weight. 

## 2013-05-19 NOTE — Patient Instructions (Addendum)
You are doing well. Try zetia 1/2 pill a day with simvastatin for cholesterol  Please call us if you have new issues that need to be addressed before your next appt.  Your physician wants you to follow-up in: 12 months.  You will receive a reminder letter in the mail two months in advance. If you don't receive a letter, please call our office to schedule the follow-up appointment.

## 2013-05-19 NOTE — Assessment & Plan Note (Signed)
Carotid endarterectomy on the right, moderate disease on the left. Encouraged aggressive cholesterol management

## 2013-05-19 NOTE — Assessment & Plan Note (Signed)
Currently with no symptoms of angina. No further workup at this time. Continue current medication regimen. 

## 2013-05-19 NOTE — Progress Notes (Signed)
Patient ID: Miranda Manning, female    DOB: [birth year], 67 y.o.   MRN: [id number]  HPI Comments: Miranda Manning is a 67 year old woman with history of severe carotid arterial disease, coronary artery disease, previous smoking history of 20 years, stopped in 1990, mild to moderate mitral regurgitation with  prolapse, mild pulmonary hypertension,hyperlipidemia, hypertension and diabetes who presents for routine followup. 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 she is active and has no chest discomfort, shortness of breath, lightheadedness near syncope.She states that she takes simvastatin 40 mg daily. She reports having problems on Lipitor and Crestor.  No further episodes of benign positional vertigo. She is helping to take care of her husband who has frequent COPD flares.  Last stress test was April 2009 which showed no significant ischemia.   EKG shows normal sinus rhythm with rate 60 beats per minute, left bundle branch block   Labs from may 2014 shows total cholesterol 177, LDL 99, hemoglobin A1c 6.3  Outpatient Encounter Prescriptions as of 05/19/2013  Medication Sig Dispense Refill  . ascorbic Acid (VITAMIN C) 500 MG CPCR Take 500 mg by mouth daily.        Marland Kitchen aspirin 325 MG EC tablet Take 325 mg by mouth daily.        . Calcium Carbonate (CALTRATE 600) 1500 MG TABS Take 1 tablet by mouth daily.        . clobetasol ointment (TEMOVATE) 0.05 % Apply 1 application topically 2 (two) times daily.       . Cranberry-Vitamin C-Vitamin E (CRANBERRY PLUS VITAMIN C) 4200-20-3 MG-MG-UNIT CAPS Take by mouth daily.        . fish oil-omega-3 fatty acids 1000 MG capsule Take 2 g by mouth daily.        Marland Kitchen glipiZIDE (GLUCOTROL) 5 MG 24 hr tablet Take 5 mg by mouth daily.       . hydrochlorothiazide 25 MG tablet Take 25 mg by mouth daily.         Marland Kitchen LORazepam (ATIVAN) 0.5 MG tablet Take 0.5 mg by mouth daily.       . Multiple Vitamin (MULTIVITAMIN) tablet Take 1 tablet by mouth daily.      . nadolol (CORGARD) 40 MG tablet Take 20 mg by mouth daily.        . potassium chloride (KLOR-CON) 10 MEQ CR tablet Take 10 mEq by mouth daily.        . simvastatin (ZOCOR) 40 MG tablet Take 1 tablet (40 mg total) by mouth every evening.  90 tablet  3  . vitamin E 1000 UNIT capsule Take 1,000 Units by mouth daily.         No facility-administered encounter medications on file as of 05/19/2013.    Review of Systems  Constitutional: Negative.   HENT: Negative.   Eyes: Negative.   Respiratory: Negative.   Cardiovascular: Negative.   Gastrointestinal: Negative.   Musculoskeletal: Negative.   Skin: Negative.   Neurological: Negative.   Psychiatric/Behavioral: Negative.   All other systems reviewed and are negative.    BP 140/84  Pulse 60  Ht 5' (1.524 m)  Wt 147 lb 12 oz (67.019 kg)  BMI 28.86 kg/m2  Physical Exam  Nursing note and vitals reviewed. Constitutional: She is oriented to person, place, and  time. She appears well-developed and well-nourished.  HENT:  Head: Normocephalic.  Nose: Nose normal.  Mouth/Throat: Oropharynx is clear and moist.  Eyes: Conjunctivae are normal. Pupils are equal, round, and reactive to light.  Neck: Normal range of motion. Neck supple. No JVD present. Carotid bruit is present.  Cardiovascular: Normal rate, regular rhythm, S1 normal, S2 normal, normal heart sounds and intact distal pulses.  Exam reveals no gallop and no friction rub.   No murmur heard. Pulmonary/Chest: Effort normal and breath sounds normal. No respiratory distress. She has no wheezes. She has no rales. She exhibits no tenderness.  Abdominal: Soft. Bowel sounds are normal. She exhibits no distension. There is no tenderness.  Musculoskeletal: Normal range of motion. She exhibits no edema and no tenderness.  Lymphadenopathy:    She  has no cervical adenopathy.  Neurological: She is alert and oriented to person, place, and time. Coordination normal.  Skin: Skin is warm and dry. No rash noted. No erythema.  Psychiatric: She has a normal mood and affect. Her behavior is normal. Judgment and thought content normal.    Assessment and Plan

## 2013-05-19 NOTE — Assessment & Plan Note (Signed)
Blood pressure is well controlled on today's visit. No changes made to the medications. 

## 2013-05-19 NOTE — Assessment & Plan Note (Signed)
Cholesterol close to goal, mildly elevated. We have suggested she start zetia 5 mg daily. She is hesitant to start a non-generic pill. She's unable to tolerate Lipitor or Crestor. Encouraged high fiber intake.

## 2013-06-10 ENCOUNTER — Ambulatory Visit: Payer: Self-pay | Admitting: Family Medicine

## 2014-05-03 ENCOUNTER — Telehealth: Payer: Self-pay | Admitting: *Deleted

## 2014-05-03 NOTE — Telephone Encounter (Signed)
Patient called and she is very dizzy and sick on stomach. Please call

## 2014-05-03 NOTE — Telephone Encounter (Signed)
Spoke w/ pt.  She reports that she has been feeling dizziness on standing and nauseous. Reports that her BP has been normal, she feels that this r/t her inner ear. She has an appt w/ Dr. Kary Kos tomorrow at 8:00am.   She will call back if she needs to see Dr. Rockey Situ after this appt.  She will keep appt w/ Dr. Rockey Situ on 05/19/14.

## 2014-05-19 ENCOUNTER — Ambulatory Visit (INDEPENDENT_AMBULATORY_CARE_PROVIDER_SITE_OTHER): Payer: Medicare PPO | Admitting: Cardiovascular Disease

## 2014-05-19 ENCOUNTER — Encounter: Payer: Self-pay | Admitting: Cardiovascular Disease

## 2014-05-19 VITALS — BP 140/80 | HR 57 | Ht 60.0 in | Wt 143.2 lb

## 2014-05-19 DIAGNOSIS — I341 Nonrheumatic mitral (valve) prolapse: Secondary | ICD-10-CM

## 2014-05-19 DIAGNOSIS — I2510 Atherosclerotic heart disease of native coronary artery without angina pectoris: Secondary | ICD-10-CM

## 2014-05-19 DIAGNOSIS — I10 Essential (primary) hypertension: Secondary | ICD-10-CM

## 2014-05-19 DIAGNOSIS — I6529 Occlusion and stenosis of unspecified carotid artery: Secondary | ICD-10-CM

## 2014-05-19 DIAGNOSIS — E119 Type 2 diabetes mellitus without complications: Secondary | ICD-10-CM

## 2014-05-19 DIAGNOSIS — I059 Rheumatic mitral valve disease, unspecified: Secondary | ICD-10-CM

## 2014-05-19 DIAGNOSIS — E785 Hyperlipidemia, unspecified: Secondary | ICD-10-CM

## 2014-05-19 DIAGNOSIS — I340 Nonrheumatic mitral (valve) insufficiency: Secondary | ICD-10-CM

## 2014-05-19 NOTE — Assessment & Plan Note (Signed)
Currently with no symptoms of angina. No further workup at this time. Continue current medication regimen. 

## 2014-05-19 NOTE — Progress Notes (Signed)
Patient ID: Miranda Manning, female    DOB: 08-02-1946, 68 y.o.   MRN: [id number]  HPI Comments: Ms. Miranda Manning is a 68 year old woman with history of severe carotid arterial disease, coronary artery disease, previous smoking history of 20 years, stopped in 1990, moderate mitral regurgitation with  prolapse, mild pulmonary hypertension,hyperlipidemia, hypertension and diabetes who presents for routine followup. 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  She has had a recent upper respiratory infection. Has hoarse voice over the past several weeks. Also reports having bilateral arm pain, worse after working in the garden. She's been taking Motrin as needed. she is active and has no chest discomfort, shortness of breath, lightheadedness near syncope. She states that she takes simvastatin 40 mg daily. She reports having problems on Lipitor and Crestor.  No further episodes of benign positional vertigo. She is helping to take care of her husband who has frequent COPD flares.  Last stress test was April 2009 which showed no significant ischemia.   EKG shows normal sinus rhythm with rate 57 beats per minute, left bundle branch block   Labs from may 2014 shows total cholesterol 177, LDL 99, hemoglobin A1c 6.3  Outpatient Encounter Prescriptions as of 05/19/2014  Medication Sig  . aspirin 325 MG EC tablet Take 325 mg by mouth daily.    . Calcium Carbonate (CALTRATE 600) 1500 MG TABS Take 1 tablet by mouth daily.    . clobetasol ointment (TEMOVATE) 4.23 % Apply 1 application topically 2 (two) times daily.   . Cranberry-Vitamin C-Vitamin E (CRANBERRY PLUS VITAMIN C) 4200-20-3 MG-MG-UNIT CAPS Take by mouth daily.    . fluticasone (FLONASE) 50 MCG/ACT nasal spray Place 1 spray into both nostrils daily.   Marland Kitchen glipiZIDE (GLUCOTROL) 5 MG 24  hr tablet Take 5 mg by mouth daily.   Marland Kitchen glucosamine-chondroitin 500-400 MG tablet Take 2 tablets by mouth daily.  . hydrochlorothiazide 25 MG tablet Take 25 mg by mouth daily.    Marland Kitchen LORazepam (ATIVAN) 0.5 MG tablet Take 0.5 mg by mouth daily.   . meclizine (ANTIVERT) 25 MG tablet Take 25 mg by mouth as needed.   . Multiple Vitamin (MULTIVITAMIN) tablet Take 1 tablet by mouth daily.  . nadolol (CORGARD) 40 MG tablet Take 20 mg by mouth daily.    . potassium chloride (K-DUR,KLOR-CON) 10 MEQ tablet Take 10 mEq by mouth once.   . potassium chloride (KLOR-CON) 10 MEQ CR tablet Take 10 mEq by mouth daily.    . simvastatin (ZOCOR) 40 MG tablet Take 1 tablet (40 mg total) by mouth every evening.   Review of Systems  Constitutional: Negative.   HENT: Negative.   Eyes: Negative.   Respiratory: Negative.   Cardiovascular: Negative.   Gastrointestinal: Negative.   Endocrine: Negative.   Musculoskeletal: Negative.   Skin: Negative.   Allergic/Immunologic: Negative.   Neurological: Negative.   Hematological: Negative.   Psychiatric/Behavioral: Negative.   All other systems reviewed and are negative.   BP 140/80  Pulse 57  Ht 5' (1.524 m)  Wt 143 lb 4 oz (64.978 kg)  BMI 27.98 kg/m2  Physical Exam  Nursing note and vitals reviewed. Constitutional: She is oriented to person, place, and time. She appears well-developed and well-nourished.  HENT:  Head: Normocephalic.  Nose: Nose normal.  Mouth/Throat:  Oropharynx is clear and moist.  Eyes: Conjunctivae are normal. Pupils are equal, round, and reactive to light.  Neck: Normal range of motion. Neck supple. No JVD present. Carotid bruit is present.  Cardiovascular: Normal rate, regular rhythm, S1 normal, S2 normal, normal heart sounds and intact distal pulses.  Exam reveals no gallop and no friction rub.   No murmur heard. Pulmonary/Chest: Effort normal and breath sounds normal. No respiratory distress. She has no wheezes. She has no rales.  She exhibits no tenderness.  Abdominal: Soft. Bowel sounds are normal. She exhibits no distension. There is no tenderness.  Musculoskeletal: Normal range of motion. She exhibits no edema and no tenderness.  Lymphadenopathy:    She has no cervical adenopathy.  Neurological: She is alert and oriented to person, place, and time. Coordination normal.  Skin: Skin is warm and dry. No rash noted. No erythema.  Psychiatric: She has a normal mood and affect. Her behavior is normal. Judgment and thought content normal.    Assessment and Plan

## 2014-05-19 NOTE — Assessment & Plan Note (Signed)
Encouraged her to stay on her simvastatin. We'll try to obtain her prior lab work

## 2014-05-19 NOTE — Patient Instructions (Addendum)
Please decrease the aspirin down to 81 mg x 2 If shoulder gets severe, hold the simvastatin for a few days  We have ordered a carotid ultrasound and echocardiogram  Please call us if you have new issues that need to be addressed before your next appt.  Your physician wants you to follow-up in: 6 months.  You will receive a reminder letter in the mail two months in advance. If you don't receive a letter, please call our office to schedule the follow-up appointment.

## 2014-05-19 NOTE — Assessment & Plan Note (Signed)
We'll schedule her for repeat carotid ultrasound. Moderate disease on the left

## 2014-05-19 NOTE — Assessment & Plan Note (Signed)
We have encouraged continued exercise, careful diet management in an effort to lose weight. 

## 2014-05-19 NOTE — Assessment & Plan Note (Signed)
Blood pressure is well controlled on today's visit. No changes made to the medications. 

## 2014-05-19 NOTE — Assessment & Plan Note (Signed)
Repeat echocardiogram has been ordered to evaluate mitral valve prolapse and regurgitation

## 2014-06-01 ENCOUNTER — Other Ambulatory Visit (INDEPENDENT_AMBULATORY_CARE_PROVIDER_SITE_OTHER): Payer: Medicare PPO

## 2014-06-01 ENCOUNTER — Encounter (INDEPENDENT_AMBULATORY_CARE_PROVIDER_SITE_OTHER): Payer: Medicare PPO

## 2014-06-01 ENCOUNTER — Other Ambulatory Visit: Payer: Self-pay

## 2014-06-01 DIAGNOSIS — I6529 Occlusion and stenosis of unspecified carotid artery: Secondary | ICD-10-CM | POA: Diagnosis not present

## 2014-06-01 DIAGNOSIS — I2510 Atherosclerotic heart disease of native coronary artery without angina pectoris: Secondary | ICD-10-CM

## 2014-06-01 DIAGNOSIS — I341 Nonrheumatic mitral (valve) prolapse: Secondary | ICD-10-CM

## 2014-06-01 DIAGNOSIS — I340 Nonrheumatic mitral (valve) insufficiency: Secondary | ICD-10-CM

## 2014-06-01 DIAGNOSIS — I059 Rheumatic mitral valve disease, unspecified: Secondary | ICD-10-CM

## 2014-06-14 ENCOUNTER — Ambulatory Visit: Payer: Self-pay | Admitting: Family Medicine

## 2014-09-14 ENCOUNTER — Emergency Department: Payer: Self-pay | Admitting: Emergency Medicine

## 2014-09-14 LAB — CBC
HCT: 43.7 % (ref 35.0–47.0)
HGB: 15 g/dL (ref 12.0–16.0)
MCH: 29.9 pg (ref 26.0–34.0)
MCHC: 34.4 g/dL (ref 32.0–36.0)
MCV: 87 fL (ref 80–100)
Platelet: 160 10*3/uL (ref 150–440)
RBC: 5.04 10*6/uL (ref 3.80–5.20)
RDW: 13.3 % (ref 11.5–14.5)
WBC: 9.9 10*3/uL (ref 3.6–11.0)

## 2014-09-14 LAB — URINALYSIS, COMPLETE
BILIRUBIN, UR: NEGATIVE
BLOOD: NEGATIVE
Bacteria: NONE SEEN
Glucose,UR: NEGATIVE mg/dL (ref 0–75)
Ketone: NEGATIVE
Leukocyte Esterase: NEGATIVE
NITRITE: NEGATIVE
PH: 7 (ref 4.5–8.0)
PROTEIN: NEGATIVE
RBC,UR: <1 /HPF (ref 0–5)
SPECIFIC GRAVITY: 1.005 (ref 1.003–1.030)
Squamous Epithelial: NONE SEEN
WBC UR: 1 /HPF (ref 0–5)

## 2014-09-14 LAB — COMPREHENSIVE METABOLIC PANEL
ALBUMIN: 3.8 g/dL (ref 3.4–5.0)
ALK PHOS: 89 U/L
ALT: 18 U/L
ANION GAP: 8 (ref 7–16)
BILIRUBIN TOTAL: 0.5 mg/dL (ref 0.2–1.0)
BUN: 10 mg/dL (ref 7–18)
CO2: 27 mmol/L (ref 21–32)
CREATININE: 0.54 mg/dL — AB (ref 0.60–1.30)
Calcium, Total: 9.4 mg/dL (ref 8.5–10.1)
Chloride: 101 mmol/L (ref 98–107)
EGFR (African American): >60
GLUCOSE: 108 mg/dL — AB (ref 65–99)
Osmolality: 272 (ref 275–301)
POTASSIUM: 3.3 mmol/L — AB (ref 3.5–5.1)
SGOT(AST): 19 U/L (ref 15–37)
Sodium: 136 mmol/L (ref 136–145)
Total Protein: 7 g/dL (ref 6.4–8.2)

## 2014-09-14 LAB — TROPONIN I: Troponin-I: <0.02 ng/mL

## 2014-09-14 LAB — LIPASE, BLOOD: Lipase: 132 U/L (ref 73–393)

## 2014-09-15 ENCOUNTER — Telehealth: Payer: Self-pay

## 2014-09-15 ENCOUNTER — Ambulatory Visit: Payer: Medicare PPO | Admitting: Cardiovascular Disease

## 2014-09-15 NOTE — Telephone Encounter (Signed)
Pt states she was in the ED last night for pain in her "upper stomach". States they saw her heart was "skipping". She also would like a referral to a lung dr.

## 2014-09-15 NOTE — Telephone Encounter (Signed)
Spoke w/ pt.  She states that her EKG showed some skipped beats in the ED last night and her daughter is concerned.  Offered her appt today at 11:00, but she states that she just sat down in a restaurant and does not want to get up and leave. Pt is sched to see Dr. Rockey Situ 09/22/14 @ 7:45.   Advised pt that she has [facility] and will need to speak w/ her PCP about scheduling appt w/ Dr. Lake Bells.  She is appreciative and will call back if she needs anything further before her appt next week.

## 2014-09-22 ENCOUNTER — Encounter: Payer: Self-pay | Admitting: Cardiovascular Disease

## 2014-09-22 ENCOUNTER — Ambulatory Visit (INDEPENDENT_AMBULATORY_CARE_PROVIDER_SITE_OTHER): Payer: Commercial Managed Care - HMO | Admitting: Cardiovascular Disease

## 2014-09-22 VITALS — BP 140/88 | HR 57 | Ht 60.0 in | Wt 139.0 lb

## 2014-09-22 DIAGNOSIS — R109 Unspecified abdominal pain: Secondary | ICD-10-CM | POA: Insufficient documentation

## 2014-09-22 DIAGNOSIS — I447 Left bundle-branch block, unspecified: Secondary | ICD-10-CM

## 2014-09-22 DIAGNOSIS — I059 Rheumatic mitral valve disease, unspecified: Secondary | ICD-10-CM

## 2014-09-22 DIAGNOSIS — R1013 Epigastric pain: Secondary | ICD-10-CM

## 2014-09-22 DIAGNOSIS — I340 Nonrheumatic mitral (valve) insufficiency: Secondary | ICD-10-CM

## 2014-09-22 DIAGNOSIS — E785 Hyperlipidemia, unspecified: Secondary | ICD-10-CM

## 2014-09-22 DIAGNOSIS — E119 Type 2 diabetes mellitus without complications: Secondary | ICD-10-CM

## 2014-09-22 DIAGNOSIS — I2510 Atherosclerotic heart disease of native coronary artery without angina pectoris: Secondary | ICD-10-CM

## 2014-09-22 DIAGNOSIS — I10 Essential (primary) hypertension: Secondary | ICD-10-CM

## 2014-09-22 DIAGNOSIS — I6529 Occlusion and stenosis of unspecified carotid artery: Secondary | ICD-10-CM

## 2014-09-22 NOTE — Assessment & Plan Note (Signed)
Recent abdominal pain last week. Workup essentially negative but CT scan showing diffuse diverticuli. Suggested she talk with Dr. Kary Kos. Uncertain if she needs to have antibiotics when necessary for severe flareup concerning for diverticulitis

## 2014-09-22 NOTE — Assessment & Plan Note (Signed)
We have encouraged continued exercise, careful diet management in an effort to lose weight. 

## 2014-09-22 NOTE — Progress Notes (Signed)
Patient ID: MARIEELENA BARTKO, female    DOB: 1946-03-09, 68 y.o.   MRN: [id number]  HPI Comments: Ms. Doig is a 69 year old woman with history of severe carotid arterial disease, coronary artery disease, previous smoking history of 20 years, stopped in 1990, moderate mitral regurgitation with  prolapse, mild pulmonary hypertension,hyperlipidemia, hypertension and diabetes who presents for routine followup. 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  She reports that she did have abdominal pain last week and went to the emergency room. She had a CT scan of her abdomen that showed diverticuli throughout her colon, also emphysema Ultrasound was essentially benign, lipase negative, LFTs negative, creatinine normal, cardiac enzymes negative She was discharged home with proton pump inhibitor Since then she's had no further episodes. she is active and has no chest discomfort, shortness of breath, lightheadedness near syncope. She states that she takes simvastatin 40 mg daily. She reports having problems on Lipitor and Crestor.  No further episodes of benign positional vertigo. She is helping to take care of her husband who has frequent COPD flares.  Last stress test was April 2009 which showed no significant ischemia.   EKG shows normal sinus rhythm with rate 57 beats per minute, left bundle branch block   Labs from may 2014 shows total cholesterol 177, LDL 99, hemoglobin A1c 6.3  Outpatient Encounter Prescriptions as of 09/22/2014  Medication Sig  . aspirin 81 MG tablet Take 162 mg by mouth daily.  . Calcium Carbonate (CALTRATE 600) 1500 MG TABS Take 1 tablet by mouth daily.    . clobetasol ointment (TEMOVATE) 5.57 % Apply 1 application topically 2 (two) times daily.   . Cranberry-Vitamin C-Vitamin E (CRANBERRY PLUS VITAMIN C)  4200-20-3 MG-MG-UNIT CAPS Take by mouth daily.    . fluticasone (FLONASE) 50 MCG/ACT nasal spray Place 1 spray into both nostrils daily.   Marland Kitchen glipiZIDE (GLUCOTROL) 5 MG 24 hr tablet Take 5 mg by mouth daily.   Marland Kitchen glucosamine-chondroitin 500-400 MG tablet Take 2 tablets by mouth daily.  . hydrochlorothiazide 25 MG tablet Take 25 mg by mouth daily.    Marland Kitchen LORazepam (ATIVAN) 0.5 MG tablet Take 0.5 mg by mouth daily.   . meclizine (ANTIVERT) 25 MG tablet Take 25 mg by mouth as needed.   . Multiple Vitamin (MULTIVITAMIN) tablet Take 1 tablet by mouth daily.  . nadolol (CORGARD) 40 MG tablet Take 20 mg by mouth daily.    . potassium chloride (K-DUR,KLOR-CON) 10 MEQ tablet Take 10 mEq by mouth once.   . potassium chloride (KLOR-CON) 10 MEQ CR tablet Take 10 mEq by mouth daily.    . simvastatin (ZOCOR) 40 MG tablet Take 1 tablet (40 mg total) by mouth every evening.    Review of Systems  Constitutional: Negative.   HENT: Negative.   Eyes: Negative.   Respiratory: Negative.   Cardiovascular: Negative.   Gastrointestinal: Positive for abdominal pain.  Endocrine: Negative.   Musculoskeletal: Negative.   Skin: Negative.   Allergic/Immunologic: Negative.   Neurological: Negative.   Hematological: Negative.   Psychiatric/Behavioral: Negative.   All other systems reviewed and are negative.   BP 140/88  Pulse 57  Ht 5' (1.524 m)  Wt 139 lb (63.05 kg)  BMI 27.15 kg/m2  Physical Exam  Nursing note and vitals reviewed. Constitutional:  She is oriented to person, place, and time. She appears well-developed and well-nourished.  HENT:  Head: Normocephalic.  Nose: Nose normal.  Mouth/Throat: Oropharynx is clear and moist.  Eyes: Conjunctivae are normal. Pupils are equal, round, and reactive to light.  Neck: Normal range of motion. Neck supple. No JVD present. Carotid bruit is present.  Cardiovascular: Normal rate, regular rhythm, S1 normal, S2 normal, normal heart sounds and intact distal pulses.   Exam reveals no gallop and no friction rub.   No murmur heard. Pulmonary/Chest: Effort normal and breath sounds normal. No respiratory distress. She has no wheezes. She has no rales. She exhibits no tenderness.  Abdominal: Soft. Bowel sounds are normal. She exhibits no distension. There is no tenderness.  Musculoskeletal: Normal range of motion. She exhibits no edema and no tenderness.  Lymphadenopathy:    She has no cervical adenopathy.  Neurological: She is alert and oriented to person, place, and time. Coordination normal.  Skin: Skin is warm and dry. No rash noted. No erythema.  Psychiatric: She has a normal mood and affect. Her behavior is normal. Judgment and thought content normal.    Assessment and Plan

## 2014-09-22 NOTE — Assessment & Plan Note (Addendum)
History of carotid endarterectomy on the right. Moderate disease on the left. We'll continue aggressive cholesterol management

## 2014-09-22 NOTE — Assessment & Plan Note (Signed)
Currently with no symptoms of angina. No further workup at this time. Continue current medication regimen. 

## 2014-09-22 NOTE — Patient Instructions (Signed)
You are doing well. No medication changes were made.  Please try 1/2 zetia slowly daily if possible  Please call us if you have new issues that need to be addressed before your next appt.  Your physician wants you to follow-up in: 6 months.  You will receive a reminder letter in the mail two months in advance. If you don't receive a letter, please call our office to schedule the follow-up appointment.

## 2014-09-22 NOTE — Assessment & Plan Note (Signed)
Blood pressure is well controlled on today's visit. No changes made to the medications. 

## 2014-09-22 NOTE — Assessment & Plan Note (Signed)
Encouraged her to stay on her simvastatin. Goal LDL less than 70. Last LDL goal. Recommended she start zetia 5 mg daily titrating up to 10 mg daily if tolerated

## 2014-10-05 ENCOUNTER — Encounter: Payer: Self-pay | Admitting: Pulmonary Disease

## 2014-10-05 ENCOUNTER — Ambulatory Visit (INDEPENDENT_AMBULATORY_CARE_PROVIDER_SITE_OTHER): Payer: Medicare PPO | Admitting: Pulmonary Disease

## 2014-10-05 VITALS — BP 134/76 | HR 66 | Ht 60.0 in | Wt 146.0 lb

## 2014-10-05 DIAGNOSIS — R911 Solitary pulmonary nodule: Secondary | ICD-10-CM | POA: Insufficient documentation

## 2014-10-05 DIAGNOSIS — Z23 Encounter for immunization: Secondary | ICD-10-CM

## 2014-10-05 DIAGNOSIS — R0602 Shortness of breath: Secondary | ICD-10-CM

## 2014-10-05 DIAGNOSIS — J432 Centrilobular emphysema: Secondary | ICD-10-CM | POA: Insufficient documentation

## 2014-10-05 NOTE — Progress Notes (Signed)
Subjective:    Patient ID: Miranda Manning, female    DOB: 06/09/1946, 68 y.o.   MRN: [id number]  HPI  [name] is here to see me because she had a CT scan of her lugns recently which showed emphysema and pulmonary fibrosis and a nodule.  She was in the ED because she had some epigastric pain so she got a CT scan.  She had reportedly some hypoxemia in the ED so she underwent the CT.  She had no dyspnea at the time.    She has a history of CAD and prolapsed mitral valve.  The valve issue has never caused much problem for her.  She has a LBBB and a history of CEA treated with a stent..  She has Hypertension and HLD. She has not had any major complications with any of her vascular problems.  Her daughter has noted dypsnea when she walks long distances.  A few years ago she had a hard time walking through and amusement park due to dyspnea.  She does not noticed that this has worsened.  She paces her self but she does not stop with activity.  She can run the vacuum cleaner but it is really hard due to dyspnea.  She has a hard time climbing a flight of stairs and it is difficulty carrying in groceries.    No cough.  She had pneumonia a year ago.    She keeps track of her oxygen saturation at home and she says that it was normal.  She smoked 1 pack of cigarettes daily from age 48 and quit around age 72.   She denies trouble swallowing, dry eyes or dry mouth, unexplained rash, or unexplained joint aches, weight loss, or fever. She has never been told in the past that she has a connective tissue disease.  Past Medical History  Diagnosis Date  . Personal history of tobacco use, presenting hazards to health   . LBBB (left bundle branch block)   . Other and unspecified hyperlipidemia   . Type II or unspecified type diabetes mellitus without mention of complication, not stated as uncontrolled   . Unspecified essential hypertension   . CAD (coronary artery disease)   . Cerebrovascular disease   . Mitral  regurgitation     Severe  . Vertigo   . Pulmonary fibrosis      Family History  Problem Relation Age of Onset  . Colon cancer Neg Hx   . Stomach cancer Neg Hx   . Hypertension Mother   . Heart attack Father 89  . COPD Father   . Allergies Son   . Rheum arthritis Mother   . Breast cancer Paternal Aunt      History   Social History  . Marital Status: Married    Spouse Name: N/A    Number of Children: N/A  . Years of Education: N/A   Occupational History  . [employer] teller    Social History Main Topics  . Smoking status: Former Smoker -- 1.00 packs/day for 25 years    Types: Cigarettes    Quit date: 02/25/1979  . Smokeless tobacco: Never Used  . Alcohol Use: No  . Drug Use: No  . Sexual Activity: Not on file   Other Topics Concern  . Not on file   Social History Narrative   Married with 3 children   1 daughter, Miranda Manning, is a Marine scientist at [employer]   Husband, Miranda Manning, has had mitral  valve replacement     Allergies  Allergen Reactions  . Codeine Rash     Outpatient Prescriptions Prior to Visit  Medication Sig Dispense Refill  . aspirin 81 MG tablet Take 162 mg by mouth daily.      . Calcium Carbonate (CALTRATE 600) 1500 MG TABS Take 1 tablet by mouth daily.        . clobetasol ointment (TEMOVATE) 5.63 % Apply 1 application topically 2 (two) times daily.       . Cranberry-Vitamin C-Vitamin E (CRANBERRY PLUS VITAMIN C) 4200-20-3 MG-MG-UNIT CAPS Take by mouth daily.        . fluticasone (FLONASE) 50 MCG/ACT nasal spray Place 1 spray into both nostrils daily as needed.       Marland Kitchen glipiZIDE (GLUCOTROL) 5 MG 24 hr tablet Take 5 mg by mouth daily.       Marland Kitchen glucosamine-chondroitin 500-400 MG tablet Take 2 tablets by mouth daily.      . hydrochlorothiazide 25 MG tablet Take 25 mg by mouth daily.        Marland Kitchen LORazepam (ATIVAN) 0.5 MG tablet Take 0.5 mg by mouth daily as needed.       . meclizine (ANTIVERT) 25 MG tablet Take 25 mg by mouth as needed.       .  Multiple Vitamin (MULTIVITAMIN) tablet Take 1 tablet by mouth daily.      . nadolol (CORGARD) 40 MG tablet Take 20 mg by mouth daily.        . potassium chloride (K-DUR,KLOR-CON) 10 MEQ tablet Take 10 mEq by mouth once.       . simvastatin (ZOCOR) 40 MG tablet Take 1 tablet (40 mg total) by mouth every evening.  90 tablet  3  . potassium chloride (KLOR-CON) 10 MEQ CR tablet Take 10 mEq by mouth daily.         No facility-administered medications prior to visit.      Review of Systems  Constitutional: Positive for fatigue. Negative for fever and unexpected weight change.  HENT: Positive for sinus pressure. Negative for congestion, dental problem, ear pain, nosebleeds, postnasal drip, rhinorrhea, sneezing, sore throat and trouble swallowing.   Eyes: Negative for redness and itching.  Respiratory: Positive for chest tightness and shortness of breath. Negative for cough and wheezing.   Cardiovascular: Negative for palpitations and leg swelling.  Gastrointestinal: Negative for nausea and vomiting.  Genitourinary: Negative for dysuria.  Musculoskeletal: Negative for joint swelling.  Skin: Negative for rash.  Neurological: Negative for headaches.  Hematological: Does not bruise/bleed easily.  Psychiatric/Behavioral: Negative for dysphoric mood. The patient is not nervous/anxious.        Objective:   Physical Exam Filed Vitals:   10/05/14 1424  BP: 134/76  Pulse: 66  Height: 5' (1.524 m)  Weight: 146 lb (66.225 kg)  SpO2: 96%   Ambulated 500 feet on RA and O2 saturation did not drop below 96%  Gen: well appearing, no acute distress HEENT: NCAT, PERRL, EOMi, OP clear, neck supple without masses PULM: very few crackles in the basis bilaterally R>L, otherwise clear CV: RRR, no mgr, no JVD AB: BS+, soft, nontender, no hsm Ext: warm, no edema, no clubbing, no cyanosis Derm: no rash or skin breakdown Neuro: A&Ox4, CN II-XII intact, strength 5/5 in all 4 extremities  09/14/2014 CT  abdomen> lung images reviewed in clinic with patient, there is paraseptal emphysema and bullous changes bilaterally in a peripheral distribution. In the left lung base there may be one very  small focal area of honeycombing, but I favor this to represent paraseptal emphysema. There is a 3 mm right middle lobe nodule.     Assessment & Plan:   Solitary pulmonary nodule I am encouraged by the fact that this nodule is very small, round, solid, and with smooth borders. These are all favorable features. However, considering her smoking history she needs a repeat CT scan in a year.  Plan:  -repeat CT chest in a year  Shortness of breath Mrs. Strauss has been referred to me for the possibility of emphysema and possible pulmonary fibrosis. She is a former smoker who has had fairly stable subjective dyspnea with moderate activity for the last 5+ years. Objectively, she has clear lungs with very few crackles in the right base, normal oximetry with ambulation, and a CT chest which only convincingly shows me emphysema and no clear evidence of fibrosis. At this point I think that the changes on her CT are just related to emphysema alone. There is one very small area in the right lung base which could easily be misinterpreted as honeycombing.  So to summarize I think the only problem here is going to be emphysema but we will proceed with a high-resolution CT chest because the radiologist does raise concern for pulmonary fibrosis.   Plan: -Full pulmonary function testing -High-resolution CT chest -If CT chest confirmed presence of fibrosis then we will send in ILD serologic panel -Defer starting inhaled therapies until pulmonary function testing has been complete -Followup 2 to 4 weeks   Updated Medication List Outpatient Encounter Prescriptions as of 10/05/2014  Medication Sig  . aspirin 81 MG tablet Take 162 mg by mouth daily.  . Calcium Carbonate (CALTRATE 600) 1500 MG TABS Take 1 tablet by mouth daily.     . clobetasol ointment (TEMOVATE) 4.01 % Apply 1 application topically 2 (two) times daily.   . Cranberry-Vitamin C-Vitamin E (CRANBERRY PLUS VITAMIN C) 4200-20-3 MG-MG-UNIT CAPS Take by mouth daily.    Marland Kitchen ezetimibe (ZETIA) 10 MG tablet Take 5 mg by mouth daily.  . fluticasone (FLONASE) 50 MCG/ACT nasal spray Place 1 spray into both nostrils daily as needed.   Marland Kitchen glipiZIDE (GLUCOTROL) 5 MG 24 hr tablet Take 5 mg by mouth daily.   Marland Kitchen glucosamine-chondroitin 500-400 MG tablet Take 2 tablets by mouth daily.  . hydrochlorothiazide 25 MG tablet Take 25 mg by mouth daily.    Marland Kitchen LORazepam (ATIVAN) 0.5 MG tablet Take 0.5 mg by mouth daily as needed.   . meclizine (ANTIVERT) 25 MG tablet Take 25 mg by mouth as needed.   . Multiple Vitamin (MULTIVITAMIN) tablet Take 1 tablet by mouth daily.  . nadolol (CORGARD) 40 MG tablet Take 20 mg by mouth daily.    . potassium chloride (K-DUR,KLOR-CON) 10 MEQ tablet Take 10 mEq by mouth once.   . simvastatin (ZOCOR) 40 MG tablet Take 1 tablet (40 mg total) by mouth every evening.  . [DISCONTINUED] potassium chloride (KLOR-CON) 10 MEQ CR tablet Take 10 mEq by mouth daily.

## 2014-10-05 NOTE — Assessment & Plan Note (Signed)
Miranda Manning has been referred to me for the possibility of emphysema and possible pulmonary fibrosis. She is a former smoker who has had fairly stable subjective dyspnea with moderate activity for the last 5+ years. Objectively, she has clear lungs with very few crackles in the right base, normal oximetry with ambulation, and a CT chest which only convincingly shows me emphysema and no clear evidence of fibrosis. At this point I think that the changes on her CT are just related to emphysema alone. There is one very small area in the right lung base which could easily be misinterpreted as honeycombing.  So to summarize I think the only problem here is going to be emphysema but we will proceed with a high-resolution CT chest because the radiologist does raise concern for pulmonary fibrosis.   Plan: -Full pulmonary function testing -High-resolution CT chest -If CT chest confirmed presence of fibrosis then we will send in ILD serologic panel -Defer starting inhaled therapies until pulmonary function testing has been complete -Followup 2 to 4 weeks

## 2014-10-05 NOTE — Patient Instructions (Signed)
We will arrange a Pulmonary function test and CT scan of your chest at [facility] and see you back in 2-4 weeks

## 2014-10-05 NOTE — Assessment & Plan Note (Signed)
I am encouraged by the fact that this nodule is very small, round, solid, and with smooth borders. These are all favorable features. However, considering her smoking history she needs a repeat CT scan in a year.  Plan:  -repeat CT chest in a year

## 2014-10-06 ENCOUNTER — Telehealth: Payer: Self-pay | Admitting: Pulmonary Disease

## 2014-10-06 ENCOUNTER — Telehealth: Payer: Self-pay

## 2014-10-06 NOTE — Telephone Encounter (Signed)
Called  And spoke with pt and she stated that she has already been scheduled for her PFT but she would like to have the CT done on the same day.  Will forward to [facility] to make them aware.

## 2014-10-06 NOTE — Telephone Encounter (Signed)
PT would like Zetia samples

## 2014-10-07 ENCOUNTER — Encounter: Payer: Self-pay | Admitting: Pulmonary Disease

## 2014-10-07 NOTE — Telephone Encounter (Signed)
Pt said dr Lake Bells wanted her ct done now but ov notes said 09/2015 which do i need to do ?

## 2014-10-07 NOTE — Telephone Encounter (Signed)
Notified patient samples available to pick up.

## 2014-10-07 NOTE — Telephone Encounter (Signed)
Her pfts is 10/11/14 but her ct is not due until 09/2015

## 2014-10-08 NOTE — Telephone Encounter (Signed)
She needs a high resolution CT chest now and another regular CT chest in one year.  See the bottom of my note, should help clear up confusion

## 2014-10-11 ENCOUNTER — Ambulatory Visit: Payer: Self-pay | Admitting: Pulmonary Disease

## 2014-10-11 LAB — PULMONARY FUNCTION TEST

## 2014-10-11 NOTE — Telephone Encounter (Signed)
High resolution scan needs to be completed now and repeat regular CT x 1 year.  Plan:  -Full pulmonary function testing  -High-resolution CT chest  -If CT chest confirmed presence of fibrosis then we will send in ILD serologic panel  -Defer starting inhaled therapies until pulmonary function testing has been complete  -Followup 2 to 4 weeks  Order in [facility] for High Res CT

## 2014-10-11 NOTE — Telephone Encounter (Signed)
Chest [email or web address]  10/12/14@2pm  pt aware and she will be scheduled in 1 yrs Joellen Jersey

## 2014-10-12 ENCOUNTER — Ambulatory Visit: Payer: Self-pay | Admitting: Pulmonary Disease

## 2014-10-18 ENCOUNTER — Telehealth: Payer: Self-pay

## 2014-10-18 NOTE — Telephone Encounter (Signed)
Message copied by Len Blalock on Mon Oct 18, 2014  5:55 PM ------      Message from: Juanito Doom      Created: Fri Oct 15, 2014  5:14 PM       A,      Please let her know that her CT showed emphysema and an old scar but nothing worrisome.      Thanks      B ------

## 2014-10-18 NOTE — Telephone Encounter (Signed)
Pt is aware of results.  Nothing further needed.

## 2014-10-29 ENCOUNTER — Encounter: Payer: Self-pay | Admitting: Pulmonary Disease

## 2014-11-03 ENCOUNTER — Telehealth: Payer: Self-pay | Admitting: Pulmonary Disease

## 2014-11-03 NOTE — Telephone Encounter (Signed)
ATC, no answer, no voicemail. WCB. [place] Bing, CMA

## 2014-11-04 NOTE — Telephone Encounter (Signed)
LM for pt to keep f/u appt with Dr Lake Bells on 11/08/14.

## 2014-11-04 NOTE — Telephone Encounter (Signed)
She has severe emphysema which was seen on the CT I think she should keep the follow up appointment next week to discuss this further

## 2014-11-04 NOTE — Telephone Encounter (Signed)
LMTCB X1 

## 2014-11-04 NOTE — Telephone Encounter (Signed)
Pt states that BQ talked to her about her scan results and said that he wouldn't need to see her back until after her f/u scan in one year.  Pt wants to know if she needs to keep her appt on Monday 11/08/14. Her copay is $45 and she doesn't want to spend this if she doesn't have too.  Please advise

## 2014-11-08 ENCOUNTER — Encounter: Payer: Self-pay | Admitting: Pulmonary Disease

## 2014-11-08 ENCOUNTER — Ambulatory Visit (INDEPENDENT_AMBULATORY_CARE_PROVIDER_SITE_OTHER): Payer: Commercial Managed Care - HMO | Admitting: Pulmonary Disease

## 2014-11-08 VITALS — BP 144/72 | HR 60 | Ht 60.0 in | Wt 140.0 lb

## 2014-11-08 DIAGNOSIS — J432 Centrilobular emphysema: Secondary | ICD-10-CM

## 2014-11-08 NOTE — Assessment & Plan Note (Addendum)
The high resolution images of her chest in 09/2014 did not show worrisome nodules.  Further, there was no clear fibrosis on my review of the images, just one small scar in the right base which could be misinterpreted as honeycombing.  Certainly the emphysema is due to her 40+ pack year smoking history.  Fortunately she quit smoking.    Plan -trial of Spiriva daily, if it helps will Rx Spiriva respimat -Flu shot UTD -come back in one year, if dyspnea progresses will repeat PFT and possibly the CT

## 2014-11-08 NOTE — Progress Notes (Signed)
Subjective:    Patient ID: Miranda Manning, female    DOB: [birth year], 68 y.o.   MRN: [id number]  Synopsis: Referred in 2015 for dyspnea and a nodule.  She smoked 1 pack per day for 44 years.   June? 2015 Hgb 15.2 09/2014 CT chest high res non-thoracic radiologist read> severe emphysema, no suspicious nodules, no fibrosis 09/2014 PFT> Ratio 74% pred, FEV1 1.68L (101% pred), TLC 4.35L (109% pred), DLCO (64% pred)  HPI  Chief Complaint  Patient presents with  . Follow-up    review ct chest.  Pt c/o some sob with exertion, no other complaints.      11/08/2014 ROV > Miranda Manning says she still has some dyspnea on exertion, no chest pain or cough.  No inhalers. She has not had a change in her weight.  She is here to discuss the results of the CT and PFT.  Past Medical History  Diagnosis Date  . Personal history of tobacco use, presenting hazards to health   . LBBB (left bundle branch block)   . Other and unspecified hyperlipidemia   . Type II or unspecified type diabetes mellitus without mention of complication, not stated as uncontrolled   . Unspecified essential hypertension   . CAD (coronary artery disease)   . Cerebrovascular disease   . Mitral regurgitation     Severe  . Vertigo   . Pulmonary fibrosis      Review of Systems     Objective:   Physical Exam Filed Vitals:   11/08/14 0937  BP: 144/72  Pulse: 60  Height: 5' (1.524 m)  Weight: 140 lb (63.504 kg)  SpO2: 97%   RA  Gen: well appearing, no acute distress HEENT: NCAT, EOMi, OP clear,  PULM: CTA B CV: RRR, no mgr, no JVD AB: BS+, soft, nontender Ext: warm, no edema, no clubbing, no cyanosis Derm: no rash or skin breakdown Neuro: A&Ox4, MAEW        Assessment & Plan:   Centrilobular emphysema The high resolution images of her chest in 09/2014 did not show worrisome nodules.  Further, there was no clear fibrosis on my review of the images, just one small scar in the right base which could be  misinterpreted as honeycombing.  Certainly the emphysema is due to her 40+ pack year smoking history.  Fortunately she quit smoking.    Plan -trial of Spiriva daily, if it helps will Rx Spiriva respimat -Flu shot UTD -come back in one year, if dyspnea progresses will repeat PFT and possibly the CT    Updated Medication List Outpatient Encounter Prescriptions as of 11/08/2014  Medication Sig  . aspirin 81 MG tablet Take 162 mg by mouth daily.  . cholecalciferol (VITAMIN D) 400 UNITS TABS tablet Take 400 Units by mouth daily.  . clobetasol ointment (TEMOVATE) 8.12 % Apply 1 application topically 2 (two) times daily.   . Cranberry-Vitamin C-Vitamin E (CRANBERRY PLUS VITAMIN C) 4200-20-3 MG-MG-UNIT CAPS Take by mouth daily.    Marland Kitchen ezetimibe (ZETIA) 10 MG tablet Take 5 mg by mouth daily.  . fluticasone (FLONASE) 50 MCG/ACT nasal spray Place 1 spray into both nostrils daily as needed.   Marland Kitchen glipiZIDE (GLUCOTROL) 5 MG 24 hr tablet Take 5 mg by mouth daily.   Marland Kitchen glucosamine-chondroitin 500-400 MG tablet Take 2 tablets by mouth daily.  . hydrochlorothiazide 25 MG tablet Take 25 mg by mouth daily.    Marland Kitchen LORazepam (ATIVAN) 0.5 MG tablet Take 0.5 mg by mouth daily  as needed.   . meclizine (ANTIVERT) 25 MG tablet Take 25 mg by mouth as needed.   . Multiple Vitamin (MULTIVITAMIN) tablet Take 1 tablet by mouth daily.  . nadolol (CORGARD) 40 MG tablet Take 20 mg by mouth daily.    . potassium chloride (K-DUR,KLOR-CON) 10 MEQ tablet Take 10 mEq by mouth once.   . simvastatin (ZOCOR) 40 MG tablet Take 1 tablet (40 mg total) by mouth every evening.  . [DISCONTINUED] Calcium Carbonate (CALTRATE 600) 1500 MG TABS Take 1 tablet by mouth daily.

## 2014-11-08 NOTE — Patient Instructions (Signed)
Take spiriva two puffs daily no matter how you feel. If you find that this helps you breathe better then let us know and we will call in a prescription. Otherwise we will see you in a year.

## 2014-12-07 ENCOUNTER — Telehealth: Payer: Self-pay

## 2014-12-07 NOTE — Telephone Encounter (Signed)
Pt would like Zetia samples. 

## 2014-12-08 NOTE — Telephone Encounter (Signed)
Pt informed

## 2014-12-08 NOTE — Telephone Encounter (Signed)
Placed samples of Zetia 10 mg at front desk for pick up.

## 2014-12-20 ENCOUNTER — Encounter: Payer: Self-pay | Admitting: *Deleted

## 2015-01-05 NOTE — Telephone Encounter (Signed)
This encounter was created in error - please disregard.

## 2015-02-03 ENCOUNTER — Telehealth: Payer: Self-pay | Admitting: *Deleted

## 2015-02-03 NOTE — Telephone Encounter (Signed)
We do not have any zetia samples. We will contact zetia rep. To receive samples and contact pt once restocked.

## 2015-02-03 NOTE — Telephone Encounter (Signed)
Pt asking for samples on Miranda Manning  Please advise.

## 2015-02-03 NOTE — Telephone Encounter (Signed)
Samples available of zetia 10 mg. Patient notified.

## 2015-03-21 ENCOUNTER — Ambulatory Visit (INDEPENDENT_AMBULATORY_CARE_PROVIDER_SITE_OTHER): Payer: PPO | Admitting: Cardiovascular Disease

## 2015-03-21 ENCOUNTER — Encounter: Payer: Self-pay | Admitting: Cardiovascular Disease

## 2015-03-21 VITALS — BP 122/72 | HR 58 | Ht 60.0 in | Wt 143.0 lb

## 2015-03-21 DIAGNOSIS — I2510 Atherosclerotic heart disease of native coronary artery without angina pectoris: Secondary | ICD-10-CM | POA: Diagnosis not present

## 2015-03-21 DIAGNOSIS — I679 Cerebrovascular disease, unspecified: Secondary | ICD-10-CM | POA: Diagnosis not present

## 2015-03-21 DIAGNOSIS — I6523 Occlusion and stenosis of bilateral carotid arteries: Secondary | ICD-10-CM

## 2015-03-21 DIAGNOSIS — I10 Essential (primary) hypertension: Secondary | ICD-10-CM | POA: Diagnosis not present

## 2015-03-21 DIAGNOSIS — E785 Hyperlipidemia, unspecified: Secondary | ICD-10-CM

## 2015-03-21 DIAGNOSIS — I447 Left bundle-branch block, unspecified: Secondary | ICD-10-CM

## 2015-03-21 DIAGNOSIS — I340 Nonrheumatic mitral (valve) insufficiency: Secondary | ICD-10-CM

## 2015-03-21 NOTE — Assessment & Plan Note (Signed)
Moderate mitral valve prolapse and regurgitation Currently with no significant symptoms. Last echocardiogram in 2015

## 2015-03-21 NOTE — Assessment & Plan Note (Signed)
History of carotid endarterectomy on the right. Moderate disease on the left. Continue aggressive cholesterol management

## 2015-03-21 NOTE — Assessment & Plan Note (Signed)
Blood pressure is well controlled on today's visit. No changes made to the medications. 

## 2015-03-21 NOTE — Patient Instructions (Addendum)
You are doing well. No medication changes were made.  Please call us if you have new issues that need to be addressed before your next appt.  Your physician wants you to follow-up in: 12 months.  You will receive a reminder letter in the mail two months in advance. If you don't receive a letter, please call our office to schedule the follow-up appointment. 

## 2015-03-21 NOTE — Assessment & Plan Note (Signed)
Baseline left bundle branch block.

## 2015-03-21 NOTE — Assessment & Plan Note (Signed)
Cholesterol is at goal on the current lipid regimen. No changes to the medications were made.  

## 2015-03-21 NOTE — Assessment & Plan Note (Signed)
Currently with no symptoms of angina. No further workup at this time. Continue current medication regimen. 

## 2015-03-21 NOTE — Progress Notes (Signed)
Patient ID: CASIA CORTI, female    DOB: 16-Mar-1946, 69 y.o.   MRN: [id number]  HPI Comments: Ms. Sandles is a 69 year old woman with history of severe carotid arterial disease, coronary artery disease, previous smoking history of 20 years, stopped in 1990, moderate mitral regurgitation with  prolapse, mild pulmonary hypertension,hyperlipidemia, hypertension and diabetes who presents for routine followup of her carotid arterial disease. she had a right carotid endarterectomy on April 05 2010,  40 - 59% disease noted on the left  She reports previous colonoscopy with numerous diverticuli  In follow-up today, she reports that she is doing well. Since her last clinic visit, she was treated for antibiotics for abnormal pain, possibly diverticuli that she was told she might have a GI ulcer. No EGD done confirm this She denies any GI bleeding She is active, no regular exercise program.  EKG on today's visit shows normal sinus rhythm with left bundle branch block, rate 58 bpm Recent lab work shows total cholesterol 142, LDL 72, hemoglobin A1c 6.2  Other past medical history  Previous episode of abdominal pain last week and went to the emergency room. She had a CT scan of her abdomen that showed diverticuli throughout her colon, also emphysema Ultrasound was essentially benign, lipase negative, LFTs negative, creatinine normal, cardiac enzymes negative She was discharged home with proton pump inhibitor Since then she's had no further episodes.  She states that she takes simvastatin 40 mg daily. She reports having problems on Lipitor and Crestor.  No further episodes of benign positional vertigo. She is helping to take care of her husband who has frequent COPD flares.  Last stress test was April 2009 which showed no significant ischemia.  Allergies  Allergen Reactions  . Codeine Rash    Outpatient Encounter Prescriptions as of 71/28/2016  Medication Sig  . aspirin 81 MG tablet Take 162 mg by  mouth daily.  . cholecalciferol (VITAMIN D) 400 UNITS TABS tablet Take 400 Units by mouth daily.  . clobetasol ointment (TEMOVATE) 7.25 % Apply 1 application topically 2 (two) times daily.   . Cranberry-Vitamin C-Vitamin E (CRANBERRY PLUS VITAMIN C) 4200-20-3 MG-MG-UNIT CAPS Take by mouth daily.    Marland Kitchen ezetimibe (ZETIA) 10 MG tablet Take 5 mg by mouth daily.  . fluticasone (FLONASE) 50 MCG/ACT nasal spray Place 1 spray into both nostrils daily as needed.   Marland Kitchen glipiZIDE (GLUCOTROL) 5 MG 24 hr tablet Take 5 mg by mouth daily.   Marland Kitchen glucosamine-chondroitin 500-400 MG tablet Take 2 tablets by mouth daily.  . hydrochlorothiazide 25 MG tablet Take 25 mg by mouth daily.    Marland Kitchen LORazepam (ATIVAN) 0.5 MG tablet Take 0.5 mg by mouth daily as needed.   . meclizine (ANTIVERT) 25 MG tablet Take 25 mg by mouth as needed.   . Multiple Vitamin (MULTIVITAMIN) tablet Take 1 tablet by mouth daily.  . nadolol (CORGARD) 40 MG tablet Take 20 mg by mouth daily.    . potassium chloride (K-DUR,KLOR-CON) 10 MEQ tablet Take 10 mEq by mouth once.   . simvastatin (ZOCOR) 40 MG tablet Take 1 tablet (40 mg total) by mouth every evening.    Past Medical History  Diagnosis Date  . Personal history of tobacco use, presenting hazards to health   . LBBB (left bundle branch block)   . Other and unspecified hyperlipidemia   . Type II or unspecified type diabetes mellitus without mention of complication, not stated as uncontrolled   . Unspecified essential hypertension   .  CAD (coronary artery disease)   . Cerebrovascular disease   . Mitral regurgitation     Severe  . Vertigo   . Pulmonary fibrosis   . Emphysema lung     Past Surgical History  Procedure Laterality Date  . Anterior cruciate ligament repair      After accident  . Vesicovaginal fistula closure w/ tah  1981  . Vein surgery      Leg    Social History  reports that she quit smoking about 36 years ago. Her smoking use included Cigarettes. She has a 25  pack-year smoking history. She has never used smokeless tobacco. She reports that she does not drink alcohol or use illicit drugs.  Family History family history includes Allergies in her son; Breast cancer in her paternal aunt; COPD in her father; Heart attack (age of onset: 31) in her father; Hypertension in her mother; Rheum arthritis in her mother. There is no history of Colon cancer or Stomach cancer.   Review of Systems  Constitutional: Negative.   Respiratory: Negative.   Cardiovascular: Negative.   Gastrointestinal: Positive for abdominal pain.  Musculoskeletal: Negative.   Skin: Negative.   Neurological: Negative.   Hematological: Negative.   Psychiatric/Behavioral: Negative.   All other systems reviewed and are negative.   BP 122/72 mmHg  Pulse 58  Ht 5' (1.524 m)  Wt 143 lb (64.864 kg)  BMI 27.93 kg/m2  Physical Exam  Constitutional: She is oriented to person, place, and time. She appears well-developed and well-nourished.  HENT:  Head: Normocephalic.  Nose: Nose normal.  Mouth/Throat: Oropharynx is clear and moist.  Eyes: Conjunctivae are normal. Pupils are equal, round, and reactive to light.  Neck: Normal range of motion. Neck supple. No JVD present. Carotid bruit is present.  Cardiovascular: Normal rate, regular rhythm, S1 normal, S2 normal, normal heart sounds and intact distal pulses.  Exam reveals no gallop and no friction rub.   No murmur heard. Pulmonary/Chest: Effort normal and breath sounds normal. No respiratory distress. She has no wheezes. She has no rales. She exhibits no tenderness.  Abdominal: Soft. Bowel sounds are normal. She exhibits no distension. There is no tenderness.  Musculoskeletal: Normal range of motion. She exhibits no edema or tenderness.  Lymphadenopathy:    She has no cervical adenopathy.  Neurological: She is alert and oriented to person, place, and time. Coordination normal.  Skin: Skin is warm and dry. No rash noted. No erythema.   Psychiatric: She has a normal mood and affect. Her behavior is normal. Judgment and thought content normal.    Assessment and Plan  Nursing note and vitals reviewed.

## 2015-04-06 ENCOUNTER — Telehealth: Payer: Self-pay | Admitting: *Deleted

## 2015-04-06 NOTE — Telephone Encounter (Signed)
Placed samples at front desk for pick up. 

## 2015-04-06 NOTE — Telephone Encounter (Signed)
Pt needs samples of zeyita.  She states she would need them today.  She has one more pill left.  Please call patient when ready.

## 2015-04-08 ENCOUNTER — Encounter: Payer: Self-pay | Admitting: Pulmonary Disease

## 2015-05-11 ENCOUNTER — Telehealth: Payer: Self-pay | Admitting: Cardiovascular Disease

## 2015-05-11 NOTE — Telephone Encounter (Signed)
Zetia samples given to pt at front desk.

## 2015-05-11 NOTE — Telephone Encounter (Signed)
Patient needs Zetia samples has one week left patient in office for Husbands coumadin check and would like before they leave office.

## 2015-06-22 ENCOUNTER — Telehealth: Payer: Self-pay | Admitting: *Deleted

## 2015-06-22 MED ORDER — EZETIMIBE 10 MG PO TABS: 5.0000 mg | ORAL_TABLET | Freq: Every day | ORAL | Status: DC

## 2015-06-22 NOTE — Telephone Encounter (Signed)
Refill sent for zetia.  

## 2015-06-22 NOTE — Telephone Encounter (Signed)
Pt is upset that we sent refills to her pharmacy. She said she always get samples  She can not afford to go and get them at the pharmacy.  Please call patient on cell phone she will be out.

## 2015-06-22 NOTE — Telephone Encounter (Signed)
Patient calling the office for samples of medication:   1.  What medication and dosage are you requesting samples for? Zetia 10mg   2.  Are you currently out of this medication? No 1 week  3. Are you requesting samples to get you through until a mail order prescription arrives? no   Please call patient when samples are ready. Thanks

## 2015-06-22 NOTE — Telephone Encounter (Signed)
Notified patient that we can get her samples of Zetia 10 mg. Told the patient we will place the zetia at the front desk with the receptionist.

## 2015-08-09 ENCOUNTER — Telehealth: Payer: Self-pay | Admitting: *Deleted

## 2015-08-09 NOTE — Telephone Encounter (Signed)
Patient calling the office for samples of medication:   1.  What medication and dosage are you requesting samples for? Zetia 10mg   2.  Are you currently out of this medication? 1 wk left  3. Are you requesting samples to get you through until a mail order prescription arrives? Husband is coming in tomorrow 08/10/15 @ 8am, can you please have those ready for spouse to pick up.

## 2015-08-09 NOTE — Telephone Encounter (Signed)
Pt was highly upset and rude she was upset because we did not have any Zetia 10 mg samples. I offered to give pt free 30 day trail for zetia pt refused card and mentioned that she has already told Dr. Rockey Situ that she can't afford zetia and will not any kind of coupon cards/free trail. She has enough for this week and next week and will wait to discuss medication options with Dr. Rockey Situ and hung up the phone.

## 2015-08-16 ENCOUNTER — Telehealth: Payer: Self-pay | Admitting: *Deleted

## 2015-08-16 MED ORDER — EZETIMIBE 10 MG PO TABS: 5.0000 mg | ORAL_TABLET | Freq: Every day | ORAL | Status: DC

## 2015-08-16 NOTE — Telephone Encounter (Signed)
Pt refused to tell me what the issue was but wanted the nurse to call her she'd like to tal about medication  Please advise.

## 2015-08-16 NOTE — Telephone Encounter (Signed)
Patient calling the office for samples of medication:   1.  What medication and dosage are you requesting samples for? Zetia 10mg   2.  Are you currently out of this medication? yes  3. Are you requesting samples to get you through until a mail order prescription arrives? No    She was extremely rude on the phone and was stating that we give her a hard time everytime she ask for samples,.

## 2015-08-16 NOTE — Telephone Encounter (Signed)
Spoke w/ pt. She requests samples of Zetia, as she states that Dr. Rockey Situ told her that he could provide her w/ samples until it went generic in the fall.  Advised her that Zetia is preparing to go generic and that we have not received samples recently.  She requests a 90 day supply be sent to her her mail order pharmacy, though she does voice frustration.

## 2015-08-16 NOTE — Telephone Encounter (Signed)
Refill sent for zetia 10 mg  

## 2015-08-23 ENCOUNTER — Other Ambulatory Visit: Payer: Self-pay

## 2015-08-23 MED ORDER — EZETIMIBE 10 MG PO TABS: 5.0000 mg | ORAL_TABLET | Freq: Every day | ORAL | Status: DC

## 2015-08-23 NOTE — Telephone Encounter (Signed)
Zetia 10 mg rx sent to local pharmacy for 7 day refill.

## 2015-08-23 NOTE — Telephone Encounter (Signed)
This encounter was created in error - please disregard.

## 2015-08-23 NOTE — Telephone Encounter (Signed)
Pt only needs 7 day supply, until her mail order comes in

## 2015-10-17 ENCOUNTER — Ambulatory Visit
Admission: RE | Admit: 2015-10-17 | Discharge: 2015-10-17 | Disposition: A | Discharge status: Home / Self Care | Payer: PPO | Source: Ambulatory Visit | Attending: Pulmonary Disease | Admitting: Pulmonary Disease

## 2015-10-17 DIAGNOSIS — R918 Other nonspecific abnormal finding of lung field: Secondary | ICD-10-CM | POA: Diagnosis not present

## 2015-10-17 DIAGNOSIS — J432 Centrilobular emphysema: Secondary | ICD-10-CM

## 2015-10-17 DIAGNOSIS — I2510 Atherosclerotic heart disease of native coronary artery without angina pectoris: Secondary | ICD-10-CM | POA: Insufficient documentation

## 2015-10-17 DIAGNOSIS — J439 Emphysema, unspecified: Secondary | ICD-10-CM | POA: Insufficient documentation

## 2015-10-17 DIAGNOSIS — R0602 Shortness of breath: Secondary | ICD-10-CM | POA: Diagnosis present

## 2015-10-19 DIAGNOSIS — R918 Other nonspecific abnormal finding of lung field: Secondary | ICD-10-CM | POA: Insufficient documentation

## 2015-10-21 ENCOUNTER — Other Ambulatory Visit: Payer: Self-pay | Admitting: Pulmonary Disease

## 2015-10-21 DIAGNOSIS — J432 Centrilobular emphysema: Secondary | ICD-10-CM

## 2015-10-21 NOTE — Progress Notes (Signed)
Quick Note:  Contacted pt with results of CT of chest per Dr. Lake Bells.  Pt expressed understanding no further concerns. ______

## 2015-10-21 NOTE — Progress Notes (Signed)
Notes Recorded by Jerrol Banana, LPN on 33/83/2919 at 3:43 PM Orders only encounter created for October 2017 CT Chest ------  Notes Recorded by Jerrol Banana, LPN on 16/60/6004 at 3:42 PM Contacted pt with results of CT of chest per Dr. Lake Bells.  Pt expressed understanding no further concerns. ------  Notes Recorded by Inge Rise, CMA on 10/19/2015 at 9:18 AM atc pt, received fast busy signal, WCB ------  Notes Recorded by Juanito Doom, MD on 10/19/2015 at 6:35 AM A, Please let her know that this only showed emphysema in her lungs, no scarring. There were a few small nodules which the radiologist felt were likely benign but recommended a repeat CT chest in one year to make sure. Please order. Thanks B

## 2015-10-21 NOTE — Progress Notes (Signed)
Quick Note:  Orders only encounter created for October 2017 CT Chest ______

## 2015-12-21 ENCOUNTER — Telehealth: Payer: Self-pay

## 2015-12-21 ENCOUNTER — Telehealth: Payer: Self-pay | Admitting: *Deleted

## 2015-12-21 NOTE — Telephone Encounter (Signed)
Please call Miranda Manning # [phone] needs prior auth on  ezetimibe (ZETIA) 10 MG tablet If they do not hear back it will need to be appealed due to lack of response.  EOC# [id number]

## 2015-12-21 NOTE — Telephone Encounter (Signed)
Ezitimide approved through 12/23/2016.

## 2015-12-21 NOTE — Telephone Encounter (Signed)
Prior auth for generic Zetia 10mg  sent to [facility].

## 2015-12-22 NOTE — Telephone Encounter (Signed)
PA has been resolved last Telephone note by LPN in [facility] [id number].

## 2015-12-28 ENCOUNTER — Other Ambulatory Visit: Payer: Self-pay | Admitting: Family Medicine

## 2015-12-28 DIAGNOSIS — I10 Essential (primary) hypertension: Secondary | ICD-10-CM | POA: Diagnosis not present

## 2015-12-28 DIAGNOSIS — Z0000 Encounter for general adult medical examination without abnormal findings: Secondary | ICD-10-CM | POA: Diagnosis not present

## 2015-12-28 DIAGNOSIS — E119 Type 2 diabetes mellitus without complications: Secondary | ICD-10-CM | POA: Diagnosis not present

## 2015-12-28 DIAGNOSIS — Z1231 Encounter for screening mammogram for malignant neoplasm of breast: Secondary | ICD-10-CM

## 2015-12-28 DIAGNOSIS — Z23 Encounter for immunization: Secondary | ICD-10-CM | POA: Diagnosis not present

## 2016-01-03 ENCOUNTER — Ambulatory Visit
Admission: RE | Admit: 2016-01-03 | Discharge: 2016-01-03 | Disposition: A | Discharge status: Home / Self Care | Payer: PPO | Source: Ambulatory Visit | Attending: Family Medicine | Admitting: Family Medicine

## 2016-01-03 DIAGNOSIS — Z1231 Encounter for screening mammogram for malignant neoplasm of breast: Secondary | ICD-10-CM | POA: Insufficient documentation

## 2016-01-23 DIAGNOSIS — L308 Other specified dermatitis: Secondary | ICD-10-CM | POA: Diagnosis not present

## 2016-01-27 DIAGNOSIS — L309 Dermatitis, unspecified: Secondary | ICD-10-CM | POA: Diagnosis not present

## 2016-03-07 DIAGNOSIS — E119 Type 2 diabetes mellitus without complications: Secondary | ICD-10-CM | POA: Diagnosis not present

## 2016-03-07 DIAGNOSIS — I25118 Atherosclerotic heart disease of native coronary artery with other forms of angina pectoris: Secondary | ICD-10-CM | POA: Diagnosis not present

## 2016-03-07 DIAGNOSIS — E785 Hyperlipidemia, unspecified: Secondary | ICD-10-CM | POA: Diagnosis not present

## 2016-03-07 DIAGNOSIS — R011 Cardiac murmur, unspecified: Secondary | ICD-10-CM | POA: Diagnosis not present

## 2016-03-07 DIAGNOSIS — I739 Peripheral vascular disease, unspecified: Secondary | ICD-10-CM | POA: Diagnosis not present

## 2016-03-07 DIAGNOSIS — I10 Essential (primary) hypertension: Secondary | ICD-10-CM | POA: Diagnosis not present

## 2016-03-22 ENCOUNTER — Telehealth: Payer: Self-pay | Admitting: Cardiovascular Disease

## 2016-03-22 NOTE — Telephone Encounter (Signed)
Patient has a new cardiologist.  Deleting recall per patient request.

## 2016-03-23 DIAGNOSIS — R011 Cardiac murmur, unspecified: Secondary | ICD-10-CM | POA: Diagnosis not present

## 2016-03-23 DIAGNOSIS — I739 Peripheral vascular disease, unspecified: Secondary | ICD-10-CM | POA: Diagnosis not present

## 2016-03-23 DIAGNOSIS — I25118 Atherosclerotic heart disease of native coronary artery with other forms of angina pectoris: Secondary | ICD-10-CM | POA: Diagnosis not present

## 2016-03-23 DIAGNOSIS — I6523 Occlusion and stenosis of bilateral carotid arteries: Secondary | ICD-10-CM | POA: Diagnosis not present

## 2016-03-26 DIAGNOSIS — L282 Other prurigo: Secondary | ICD-10-CM | POA: Diagnosis not present

## 2016-04-02 DIAGNOSIS — E785 Hyperlipidemia, unspecified: Secondary | ICD-10-CM | POA: Diagnosis not present

## 2016-04-02 DIAGNOSIS — I10 Essential (primary) hypertension: Secondary | ICD-10-CM | POA: Diagnosis not present

## 2016-04-02 DIAGNOSIS — I739 Peripheral vascular disease, unspecified: Secondary | ICD-10-CM | POA: Diagnosis not present

## 2016-04-02 DIAGNOSIS — E119 Type 2 diabetes mellitus without complications: Secondary | ICD-10-CM | POA: Diagnosis not present

## 2016-04-02 DIAGNOSIS — I25118 Atherosclerotic heart disease of native coronary artery with other forms of angina pectoris: Secondary | ICD-10-CM | POA: Diagnosis not present

## 2016-04-23 DIAGNOSIS — E7800 Pure hypercholesterolemia, unspecified: Secondary | ICD-10-CM | POA: Diagnosis not present

## 2016-04-23 DIAGNOSIS — E119 Type 2 diabetes mellitus without complications: Secondary | ICD-10-CM | POA: Diagnosis not present

## 2016-04-26 ENCOUNTER — Encounter: Payer: Self-pay | Admitting: Vascular Surgery

## 2016-04-30 DIAGNOSIS — E785 Hyperlipidemia, unspecified: Secondary | ICD-10-CM | POA: Diagnosis not present

## 2016-04-30 DIAGNOSIS — I10 Essential (primary) hypertension: Secondary | ICD-10-CM | POA: Diagnosis not present

## 2016-04-30 DIAGNOSIS — E119 Type 2 diabetes mellitus without complications: Secondary | ICD-10-CM | POA: Diagnosis not present

## 2016-04-30 DIAGNOSIS — L853 Xerosis cutis: Secondary | ICD-10-CM | POA: Diagnosis not present

## 2016-05-01 ENCOUNTER — Encounter: Payer: Self-pay | Admitting: Vascular Surgery

## 2016-05-01 ENCOUNTER — Ambulatory Visit (INDEPENDENT_AMBULATORY_CARE_PROVIDER_SITE_OTHER): Payer: PPO | Admitting: Vascular Surgery

## 2016-05-01 VITALS — BP 119/69 | HR 57 | Resp 16 | Ht 60.0 in | Wt 140.3 lb

## 2016-05-01 DIAGNOSIS — I6522 Occlusion and stenosis of left carotid artery: Secondary | ICD-10-CM | POA: Diagnosis not present

## 2016-05-01 DIAGNOSIS — I6529 Occlusion and stenosis of unspecified carotid artery: Secondary | ICD-10-CM | POA: Insufficient documentation

## 2016-05-01 NOTE — Progress Notes (Signed)
Subjective:     Patient ID: Miranda Manning, female   DOB: 11-Nov-1946, 70 y.o.   MRN: [id number]  HPIThis 70 year old female was referred for evaluation of carotid occlusive disease. She previously underwent right carotid endarterectomy by me in 2011. She had severe dizziness preoperatively which resolved. She returned one time for follow-up but has been followed since then by her cardiologist at [facility] [place].. Her cardiologist currently is Dr. Bartholome Bill who recently ordered a carotid duplex exam which revealed some progression of disease on the left side she was referred for further evaluation. She denies any lateralizing weakness, aphasia, amaurosis fugax, diplopia, blurred vision, or syncope. She also denies upper extremity claudication symptoms or dizziness. She has done well from a cardiac standpoint with no history of myocardial infarction. She takes 2 aspirin per day.  Past Medical History  Diagnosis Date  . Personal history of tobacco use, presenting hazards to health   . LBBB (left bundle branch block)   . Other and unspecified hyperlipidemia   . Type II or unspecified type diabetes mellitus without mention of complication, not stated as uncontrolled   . Unspecified essential hypertension   . CAD (coronary artery disease)   . Cerebrovascular disease   . Mitral regurgitation     Severe  . Vertigo   . Pulmonary fibrosis ([place])   . Emphysema lung ([place])     Social History  Substance Use Topics  . Smoking status: Former Smoker -- 1.00 packs/day for 25 years    Types: Cigarettes    Quit date: 02/25/1979  . Smokeless tobacco: Never Used  . Alcohol Use: No    Family History  Problem Relation Age of Onset  . Colon cancer Neg Hx   . Stomach cancer Neg Hx   . Hypertension Mother   . Rheum arthritis Mother   . Heart attack Father 52  . COPD Father   . Allergies Son   . Breast cancer Paternal Aunt   . Heart disease Brother     before age 73    Allergies  Allergen  Reactions  . Codeine Rash     Current outpatient prescriptions:  .  aspirin 81 MG tablet, Take 162 mg by mouth daily., Disp: , Rfl:  .  cholecalciferol (VITAMIN D) 400 UNITS TABS tablet, Take 400 Units by mouth daily., Disp: , Rfl:  .  clobetasol ointment (TEMOVATE) AB-123456789 %, Apply 1 application topically 2 (two) times daily. , Disp: , Rfl:  .  Cranberry-Vitamin C-Vitamin E (CRANBERRY PLUS VITAMIN C) 4200-20-3 MG-MG-UNIT CAPS, Take by mouth daily.  , Disp: , Rfl:  .  ezetimibe (ZETIA) 10 MG tablet, Take 0.5 tablets (5 mg total) by mouth daily., Disp: 7 tablet, Rfl: 1 .  fluticasone (FLONASE) 50 MCG/ACT nasal spray, Place 1 spray into both nostrils daily as needed. , Disp: , Rfl:  .  glipiZIDE (GLUCOTROL) 5 MG 24 hr tablet, Take 5 mg by mouth daily. , Disp: , Rfl:  .  glucosamine-chondroitin 500-400 MG tablet, Take 2 tablets by mouth daily., Disp: , Rfl:  .  hydrochlorothiazide 25 MG tablet, Take 25 mg by mouth daily.  , Disp: , Rfl:  .  LORazepam (ATIVAN) 0.5 MG tablet, Take 0.5 mg by mouth daily as needed. , Disp: , Rfl:  .  meclizine (ANTIVERT) 25 MG tablet, Take 25 mg by mouth as needed. , Disp: , Rfl:  .  Multiple Vitamin (MULTIVITAMIN) tablet, Take 1 tablet by mouth daily., Disp: , Rfl:  .  nadolol (CORGARD) 40 MG tablet, Take 20 mg by mouth daily.  , Disp: , Rfl:  .  potassium chloride (K-DUR,KLOR-CON) 10 MEQ tablet, Take 10 mEq by mouth once. , Disp: , Rfl:  .  simvastatin (ZOCOR) 40 MG tablet, Take 1 tablet (40 mg total) by mouth every evening., Disp: 90 tablet, Rfl: 3  Filed Vitals:   05/01/16 1055 05/01/16 1058  BP: 160/71 119/69  Pulse: 57   Resp: 16   Height: 5' (1.524 m)   Weight: 140 lb 4.8 oz (63.64 kg)   SpO2: 98%     Body mass index is 27.4 kg/(m^2).           Review of SystemsPatient has history of mitral regurgitation, hypertension, hyperlipidemia, type 2 diabetes mellitus. She has had arrhythmias in the past but never required anticoagulation. Denies lower  extremity claudication. Did injure her right knee which required surgery. Other systems negative and a complete review of systems     Objective:   Physical Exam BP 119/69 mmHg  Pulse 57  Resp 16  Ht 5' (1.524 m)  Wt 140 lb 4.8 oz (63.64 kg)  BMI 27.40 kg/m2  SpO2 98%    Gen.-alert and oriented x3 in no apparent distress HEENT normal for age Lungs no rhonchi or wheezing Cardiovascular regular rhythm no murmurs carotid pulses 3+ palpable -bilateral harsh carotid bruits. Abdomen soft nontender no palpable masses Musculoskeletal free of  major deformities Skin clear -no rashes Neurologic normal Lower extremities 3+ femoral and dorsalis pedis pulses palpable bilaterally with no edema  Today I reviewed the records supplied by Dr.Fath and also reviewed the carotid duplex report and velocities.  It appears that patient does have a 50-60% left ICA stenosis. The right carotid endarterectomy site appears widely patent with minimal restenosis. 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 Servando Snare with carotid duplex exam at that time

## 2016-05-03 ENCOUNTER — Encounter: Payer: Self-pay | Admitting: Cardiology

## 2016-06-04 DIAGNOSIS — N390 Urinary tract infection, site not specified: Secondary | ICD-10-CM | POA: Diagnosis not present

## 2016-07-30 DIAGNOSIS — L820 Inflamed seborrheic keratosis: Secondary | ICD-10-CM | POA: Diagnosis not present

## 2016-07-30 DIAGNOSIS — L282 Other prurigo: Secondary | ICD-10-CM | POA: Diagnosis not present

## 2016-10-22 ENCOUNTER — Ambulatory Visit
Admission: RE | Admit: 2016-10-22 | Discharge: 2016-10-22 | Disposition: A | Discharge status: Home / Self Care | Payer: PPO | Source: Ambulatory Visit | Attending: Pulmonary Disease | Admitting: Pulmonary Disease

## 2016-10-22 DIAGNOSIS — R918 Other nonspecific abnormal finding of lung field: Secondary | ICD-10-CM | POA: Insufficient documentation

## 2016-10-22 DIAGNOSIS — I700 Atherosclerosis of aorta: Secondary | ICD-10-CM | POA: Diagnosis not present

## 2016-10-22 DIAGNOSIS — J432 Centrilobular emphysema: Secondary | ICD-10-CM | POA: Insufficient documentation

## 2016-10-22 DIAGNOSIS — E119 Type 2 diabetes mellitus without complications: Secondary | ICD-10-CM | POA: Diagnosis not present

## 2016-10-29 DIAGNOSIS — L853 Xerosis cutis: Secondary | ICD-10-CM | POA: Diagnosis not present

## 2016-10-29 DIAGNOSIS — E119 Type 2 diabetes mellitus without complications: Secondary | ICD-10-CM | POA: Diagnosis not present

## 2016-10-29 DIAGNOSIS — I10 Essential (primary) hypertension: Secondary | ICD-10-CM | POA: Diagnosis not present

## 2016-10-29 DIAGNOSIS — Z23 Encounter for immunization: Secondary | ICD-10-CM | POA: Diagnosis not present

## 2016-10-29 DIAGNOSIS — E785 Hyperlipidemia, unspecified: Secondary | ICD-10-CM | POA: Diagnosis not present

## 2016-11-08 ENCOUNTER — Ambulatory Visit (INDEPENDENT_AMBULATORY_CARE_PROVIDER_SITE_OTHER): Payer: PPO | Admitting: Pulmonary Disease

## 2016-11-08 ENCOUNTER — Encounter: Payer: Self-pay | Admitting: Pulmonary Disease

## 2016-11-08 VITALS — BP 108/64 | HR 60 | Ht 60.0 in | Wt 138.8 lb

## 2016-11-08 DIAGNOSIS — Z23 Encounter for immunization: Secondary | ICD-10-CM | POA: Diagnosis not present

## 2016-11-08 DIAGNOSIS — J432 Centrilobular emphysema: Secondary | ICD-10-CM | POA: Diagnosis not present

## 2016-11-08 DIAGNOSIS — R918 Other nonspecific abnormal finding of lung field: Secondary | ICD-10-CM | POA: Diagnosis not present

## 2016-11-08 NOTE — Progress Notes (Signed)
Subjective:    Patient ID: Miranda Manning, female    DOB: 1946/09/10, 70 y.o.   MRN: [id number]  Synopsis: First evaluated by [facility] in 2015 for centrilobular emphysema with normal airflow.. She has a past medical history significant for 40-pack-year smoking history but has a 2015 she had quit smoking. October 2015 pulmonary function testing ratio 72%, FEV1 1.71 L 103% predicted, total lung capacity 109% predicted, DLCO 64% predicted  HPI Chief Complaint  Patient presents with  . Follow-up    2 year Emphysema follow up and CT review.  breathing is baseline, no new complaints   Laurelei is accompanied by her husband Mortimer Fries who is my patient.  No respiratory symptoms. No shortness of breath, no chest tightness, no wheezing. She does whatever she wants to do. She does not feel limited by breathing. No sinus complaints.  Past Medical History:  Diagnosis Date  . CAD (coronary artery disease)   . Cerebrovascular disease   . Emphysema lung ([place])   . LBBB (left bundle branch block)   . Mitral regurgitation    Severe  . Other and unspecified hyperlipidemia   . Personal history of tobacco use, presenting hazards to health   . Pulmonary fibrosis ([place])   . Type II or unspecified type diabetes mellitus without mention of complication, not stated as uncontrolled   . Unspecified essential hypertension   . Vertigo       Review of Systems     Objective:   Physical Exam Vitals:   11/08/16 1048  BP: 108/64  Pulse: 60  SpO2: 95%  Weight: 138 lb 12.8 oz (63 kg)  Height: 5' (1.524 m)    Gen: well appearing HENT: OP clear, TM's clear, neck supple PULM: CTA B, normal percussion CV: RRR, no mgr, trace edema GI: BS+, soft, nontender Derm: no cyanosis or rash Psyche: normal mood and affect       Assessment & Plan:  Centrilobular emphysema (HCC) She has centrilobular emphysema with normal airflow obstruction. To clarify, she does not have COPD based on lung function testing.  She has no symptoms of shortness of breath. However, with her emphysema she has increased risk for a severe respiratory infection so she does need to remain vaccinated. She's had a flu shot in 2017.  Plan: Prevnar vaccine today I recommended that she get the Pneumovax vaccine in the next few months Follow-up with me on an as-needed basis  Pulmonary nodules I have personally reviewed the images from her CT chest which showed moderate to severe centrilobular emphysema and an upper lobe predominant fashion. The pulmonary nodules which are mostly located in her right lower lobe have all been stable over a two-year interval. No further imaging needed at this time.    Current Outpatient Prescriptions:  .  aspirin 81 MG tablet, Take 162 mg by mouth daily., Disp: , Rfl:  .  cholecalciferol (VITAMIN D) 400 UNITS TABS tablet, Take 400 Units by mouth daily., Disp: , Rfl:  .  clobetasol ointment (TEMOVATE) AB-123456789 %, Apply 1 application topically 2 (two) times daily. , Disp: , Rfl:  .  Cranberry-Vitamin C-Vitamin E (CRANBERRY PLUS VITAMIN C) 4200-20-3 MG-MG-UNIT CAPS, Take by mouth daily.  , Disp: , Rfl:  .  ezetimibe (ZETIA) 10 MG tablet, Take 0.5 tablets (5 mg total) by mouth daily., Disp: 7 tablet, Rfl: 1 .  fluticasone (FLONASE) 50 MCG/ACT nasal spray, Place 1 spray into both nostrils daily as needed. , Disp: , Rfl:  .  glipiZIDE (GLUCOTROL) 5 MG 24 hr tablet, Take 5 mg by mouth daily. , Disp: , Rfl:  .  glucosamine-chondroitin 500-400 MG tablet, Take 2 tablets by mouth daily., Disp: , Rfl:  .  hydrochlorothiazide 25 MG tablet, Take 25 mg by mouth daily.  , Disp: , Rfl:  .  LORazepam (ATIVAN) 0.5 MG tablet, Take 0.5 mg by mouth daily as needed. , Disp: , Rfl:  .  meclizine (ANTIVERT) 25 MG tablet, Take 25 mg by mouth as needed. , Disp: , Rfl:  .  Multiple Vitamin (MULTIVITAMIN) tablet, Take 1 tablet by mouth daily., Disp: , Rfl:  .  nadolol (CORGARD) 40 MG tablet, Take 20 mg by mouth daily.  , Disp: ,  Rfl:  .  potassium chloride (K-DUR,KLOR-CON) 10 MEQ tablet, Take 10 mEq by mouth daily. , Disp: , Rfl:  .  simvastatin (ZOCOR) 40 MG tablet, Take 1 tablet (40 mg total) by mouth every evening., Disp: 90 tablet, Rfl: 3

## 2016-11-08 NOTE — Assessment & Plan Note (Signed)
I have personally reviewed the images from her CT chest which showed moderate to severe centrilobular emphysema and an upper lobe predominant fashion. The pulmonary nodules which are mostly located in her right lower lobe have all been stable over a two-year interval. No further imaging needed at this time.

## 2016-11-08 NOTE — Addendum Note (Signed)
Addended by: Collier Salina on: 11/08/2016 12:26 PM   Modules accepted: Orders

## 2016-11-08 NOTE — Assessment & Plan Note (Signed)
She has centrilobular emphysema with normal airflow obstruction. To clarify, she does not have COPD based on lung function testing. She has no symptoms of shortness of breath. However, with her emphysema she has increased risk for a severe respiratory infection so she does need to remain vaccinated. She's had a flu shot in 2017.  Plan: Prevnar vaccine today I recommended that she get the Pneumovax vaccine in the next few months Follow-up with me on an as-needed basis

## 2016-11-08 NOTE — Patient Instructions (Signed)
Today we gave you a Prevnar vaccine, I recommend that you get a Pneumovax vaccine as well We will see you back on an as-needed basis

## 2016-11-19 DIAGNOSIS — I739 Peripheral vascular disease, unspecified: Secondary | ICD-10-CM | POA: Diagnosis not present

## 2016-11-19 DIAGNOSIS — E785 Hyperlipidemia, unspecified: Secondary | ICD-10-CM | POA: Diagnosis not present

## 2016-11-19 DIAGNOSIS — I10 Essential (primary) hypertension: Secondary | ICD-10-CM | POA: Diagnosis not present

## 2016-11-19 DIAGNOSIS — I25118 Atherosclerotic heart disease of native coronary artery with other forms of angina pectoris: Secondary | ICD-10-CM | POA: Diagnosis not present

## 2016-11-19 DIAGNOSIS — I059 Rheumatic mitral valve disease, unspecified: Secondary | ICD-10-CM | POA: Diagnosis not present

## 2016-11-28 DIAGNOSIS — E119 Type 2 diabetes mellitus without complications: Secondary | ICD-10-CM | POA: Diagnosis not present

## 2016-12-25 ENCOUNTER — Other Ambulatory Visit: Payer: Self-pay | Admitting: Family Medicine

## 2016-12-25 DIAGNOSIS — Z1231 Encounter for screening mammogram for malignant neoplasm of breast: Secondary | ICD-10-CM

## 2017-01-23 ENCOUNTER — Ambulatory Visit: Payer: PPO | Attending: Family Medicine

## 2017-01-27 DIAGNOSIS — J439 Emphysema, unspecified: Secondary | ICD-10-CM | POA: Diagnosis not present

## 2017-01-27 DIAGNOSIS — J22 Unspecified acute lower respiratory infection: Secondary | ICD-10-CM | POA: Diagnosis not present

## 2017-03-06 DIAGNOSIS — I059 Rheumatic mitral valve disease, unspecified: Secondary | ICD-10-CM | POA: Diagnosis not present

## 2017-03-15 ENCOUNTER — Ambulatory Visit
Admission: RE | Admit: 2017-03-15 | Discharge: 2017-03-15 | Disposition: A | Discharge status: Home / Self Care | Payer: PPO | Source: Ambulatory Visit | Attending: Family Medicine | Admitting: Family Medicine

## 2017-03-15 DIAGNOSIS — Z1231 Encounter for screening mammogram for malignant neoplasm of breast: Secondary | ICD-10-CM | POA: Diagnosis not present

## 2017-03-26 DIAGNOSIS — I25118 Atherosclerotic heart disease of native coronary artery with other forms of angina pectoris: Secondary | ICD-10-CM | POA: Diagnosis not present

## 2017-03-26 DIAGNOSIS — I2510 Atherosclerotic heart disease of native coronary artery without angina pectoris: Secondary | ICD-10-CM | POA: Diagnosis not present

## 2017-03-26 DIAGNOSIS — E785 Hyperlipidemia, unspecified: Secondary | ICD-10-CM | POA: Diagnosis not present

## 2017-03-26 DIAGNOSIS — I10 Essential (primary) hypertension: Secondary | ICD-10-CM | POA: Diagnosis not present

## 2017-04-25 ENCOUNTER — Encounter: Payer: Self-pay | Admitting: Vascular Surgery

## 2017-04-29 DIAGNOSIS — I10 Essential (primary) hypertension: Secondary | ICD-10-CM | POA: Diagnosis not present

## 2017-04-29 DIAGNOSIS — E119 Type 2 diabetes mellitus without complications: Secondary | ICD-10-CM | POA: Diagnosis not present

## 2017-04-29 DIAGNOSIS — E785 Hyperlipidemia, unspecified: Secondary | ICD-10-CM | POA: Diagnosis not present

## 2017-04-29 DIAGNOSIS — I739 Peripheral vascular disease, unspecified: Secondary | ICD-10-CM | POA: Diagnosis not present

## 2017-05-02 ENCOUNTER — Other Ambulatory Visit: Payer: Self-pay

## 2017-05-02 DIAGNOSIS — I6522 Occlusion and stenosis of left carotid artery: Secondary | ICD-10-CM

## 2017-05-02 DIAGNOSIS — Z9889 Other specified postprocedural states: Secondary | ICD-10-CM

## 2017-05-03 ENCOUNTER — Encounter: Payer: Self-pay | Admitting: Vascular Surgery

## 2017-05-03 ENCOUNTER — Ambulatory Visit (HOSPITAL_COMMUNITY)
Admission: RE | Admit: 2017-05-03 | Discharge: 2017-05-03 | Disposition: A | Discharge status: Home / Self Care | Payer: PPO | Source: Ambulatory Visit | Attending: Vascular Surgery | Admitting: Vascular Surgery

## 2017-05-03 ENCOUNTER — Ambulatory Visit (INDEPENDENT_AMBULATORY_CARE_PROVIDER_SITE_OTHER): Payer: PPO | Admitting: Vascular Surgery

## 2017-05-03 VITALS — BP 97/60 | HR 56 | Temp 97.4°F | Ht 61.0 in | Wt 135.0 lb

## 2017-05-03 DIAGNOSIS — I6522 Occlusion and stenosis of left carotid artery: Secondary | ICD-10-CM | POA: Insufficient documentation

## 2017-05-03 DIAGNOSIS — Z9889 Other specified postprocedural states: Secondary | ICD-10-CM

## 2017-05-03 LAB — VAS US CAROTID
LCCADDIAS: 19 cm/s
LCCADSYS: 142 cm/s
LCCAPDIAS: 20 cm/s
LEFT ECA DIAS: -9 cm/s
LICADDIAS: -20 cm/s
LICADSYS: -64 cm/s
Left CCA prox sys: 104 cm/s
RCCAPSYS: 157 cm/s
RIGHT CCA MID DIAS: -15 cm/s
RIGHT ECA DIAS: -3 cm/s
RIGHT VERTEBRAL DIAS: -9 cm/s
Right CCA prox dias: 17 cm/s
Right cca dist sys: -42 cm/s

## 2017-05-03 NOTE — Progress Notes (Signed)
Patient ID: Miranda Manning, female   DOB: 1946/09/23, 71 y.o.   MRN: [id number]  Reason for Consult: Carotid   Referred by Miranda Pink, MD  Subjective:     HPI:  Miranda Manning is a 71 y.o. female follows up from previous right carotid endarterectomy performed by Dr. Kellie Simmering. She at that time was having dizziness that resolved after surgery. She has no further neurologic symptoms and has been fine the last year without issues. She does take a statin and that area as well as aspirin every day. She walks without issues. She has no complaints related to today's visit.  Past Medical History:  Diagnosis Date  . CAD (coronary artery disease)   . Cerebrovascular disease   . Emphysema lung ([place])   . Emphysema lung ([place])   . LBBB (left bundle branch block)   . Mitral regurgitation    Severe  . Other and unspecified hyperlipidemia   . Personal history of tobacco use, presenting hazards to health   . Pulmonary fibrosis ([place])   . Type II or unspecified type diabetes mellitus without mention of complication, not stated as uncontrolled   . Unspecified essential hypertension   . Vertigo    Family History  Problem Relation Age of Onset  . Hypertension Mother   . Rheum arthritis Mother   . Heart attack Father 14  . COPD Father   . Allergies Son   . Heart disease Brother        before age 64  . Breast cancer Paternal Aunt   . Colon cancer Neg Hx   . Stomach cancer Neg Hx    Past Surgical History:  Procedure Laterality Date  . ABDOMINAL HYSTERECTOMY    . ANTERIOR CRUCIATE LIGAMENT REPAIR     After accident  . BREAST BIOPSY Left 2008   benign  . VEIN SURGERY     Leg  . VESICOVAGINAL FISTULA CLOSURE W/ TAH  1981    Short Social History:  Social History  Substance Use Topics  . Smoking status: Former Smoker    Packs/day: 1.00    Years: 25.00    Types: Cigarettes    Quit date: 02/25/1979  . Smokeless tobacco: Never Used  . Alcohol use No    Allergies  Allergen Reactions  .  Codeine Rash    Current Outpatient Prescriptions  Medication Sig Dispense Refill  . aspirin 81 MG tablet Take 162 mg by mouth daily.    . cholecalciferol (VITAMIN D) 400 UNITS TABS tablet Take 400 Units by mouth daily.    . clobetasol ointment (TEMOVATE) 1.44 % Apply 1 application topically 2 (two) times daily.     . Cranberry-Vitamin C-Vitamin E (CRANBERRY PLUS VITAMIN C) 4200-20-3 MG-MG-UNIT CAPS Take by mouth daily.      Marland Kitchen ezetimibe (ZETIA) 10 MG tablet Take 0.5 tablets (5 mg total) by mouth daily. 7 tablet 1  . fluticasone (FLONASE) 50 MCG/ACT nasal spray Place 1 spray into both nostrils daily as needed.     Marland Kitchen glipiZIDE (GLUCOTROL) 5 MG 24 hr tablet Take 5 mg by mouth daily.     Marland Kitchen glucosamine-chondroitin 500-400 MG tablet Take 2 tablets by mouth daily.    . hydrochlorothiazide 25 MG tablet Take 25 mg by mouth daily.      Marland Kitchen LORazepam (ATIVAN) 0.5 MG tablet Take 0.5 mg by mouth daily as needed.     . meclizine (ANTIVERT) 25 MG tablet Take 25 mg by mouth as needed.     Marland Kitchen  Multiple Vitamin (MULTIVITAMIN) tablet Take 1 tablet by mouth daily.    . nadolol (CORGARD) 40 MG tablet Take 20 mg by mouth daily.      . potassium chloride (K-DUR,KLOR-CON) 10 MEQ tablet Take 10 mEq by mouth daily.     . simvastatin (ZOCOR) 40 MG tablet Take 1 tablet (40 mg total) by mouth every evening. 90 tablet 3   No current facility-administered medications for this visit.     Review of Systems  Constitutional:  Constitutional negative. HENT: HENT negative.  Eyes: Eyes negative.  Respiratory: Positive for shortness of breath.  Cardiovascular: Positive for irregular heartbeat.  GI: Gastrointestinal negative.  Musculoskeletal: Musculoskeletal negative.  Skin: Skin negative.  Neurological: Neurological negative. Hematologic: Hematologic/lymphatic negative.  Psychiatric: Psychiatric negative.        Objective:  Objective   Vitals:   05/03/17 1135  BP: 97/60  Pulse: (!) 56  Temp: 97.4 F (36.3 C)    SpO2: 96%  Weight: 135 lb (61.2 kg)  Height: 5\' 1"  (1.549 m)   Body mass index is 25.51 kg/m.  Physical Exam  Constitutional: She is oriented to person, place, and time. She appears well-developed.  HENT:  Head: Normocephalic.  Eyes: Pupils are equal, round, and reactive to light.  Neck: Normal range of motion.  Well healed right neck incision  Cardiovascular: Regular rhythm.   Pulses:      Radial pulses are 2+ on the right side, and 2+ on the left side.       Femoral pulses are 2+ on the right side, and 2+ on the left side. Pulmonary/Chest: Effort normal.  Abdominal: Soft. She exhibits no mass. There is tenderness.  Musculoskeletal: Normal range of motion. She exhibits no edema.  Lymphadenopathy:    She has no cervical adenopathy.  Neurological: She is alert and oriented to person, place, and time.  Skin: Skin is warm and dry.  Psychiatric: She has a normal mood and affect. Her behavior is normal. Judgment and thought content normal.    Data:  I have independently interpreted her carotid duplexes from today which demonstrates a patent right carotid endarterectomy site and left internal carotid artery stenosis at 40-59%.     Assessment/Plan:     71 year old female follows up for 1 year carotid artery evaluation with duplex demonstrating stable left internal carotid stenosis and patent left right carotid endarterectomy. She has no new symptoms. We have ordered repeat duplex for next year and she'll follow up in 1 year. We again discussed signs and symptoms of stroke or TIA and she demonstrated good understanding     Waynetta Sandy MD [facility]

## 2017-05-06 NOTE — Addendum Note (Signed)
Addended by: Lianne Cure A on: 05/06/2017 03:00 PM   Modules accepted: Orders

## 2017-09-25 DIAGNOSIS — I10 Essential (primary) hypertension: Secondary | ICD-10-CM | POA: Diagnosis not present

## 2017-09-25 DIAGNOSIS — I25118 Atherosclerotic heart disease of native coronary artery with other forms of angina pectoris: Secondary | ICD-10-CM | POA: Diagnosis not present

## 2017-09-25 DIAGNOSIS — I739 Peripheral vascular disease, unspecified: Secondary | ICD-10-CM | POA: Diagnosis not present

## 2017-09-25 DIAGNOSIS — E785 Hyperlipidemia, unspecified: Secondary | ICD-10-CM | POA: Diagnosis not present

## 2017-09-25 DIAGNOSIS — I272 Pulmonary hypertension, unspecified: Secondary | ICD-10-CM | POA: Diagnosis not present

## 2017-10-24 DIAGNOSIS — E119 Type 2 diabetes mellitus without complications: Secondary | ICD-10-CM | POA: Diagnosis not present

## 2017-10-24 DIAGNOSIS — Z23 Encounter for immunization: Secondary | ICD-10-CM | POA: Diagnosis not present

## 2017-10-31 DIAGNOSIS — E785 Hyperlipidemia, unspecified: Secondary | ICD-10-CM | POA: Diagnosis not present

## 2017-10-31 DIAGNOSIS — E119 Type 2 diabetes mellitus without complications: Secondary | ICD-10-CM | POA: Diagnosis not present

## 2017-10-31 DIAGNOSIS — I10 Essential (primary) hypertension: Secondary | ICD-10-CM | POA: Diagnosis not present

## 2017-11-22 DIAGNOSIS — J00 Acute nasopharyngitis [common cold]: Secondary | ICD-10-CM | POA: Diagnosis not present

## 2017-11-27 ENCOUNTER — Encounter: Payer: Self-pay | Admitting: Internal Medicine

## 2017-12-13 ENCOUNTER — Encounter: Payer: Self-pay | Admitting: Internal Medicine

## 2018-01-02 DIAGNOSIS — F430 Acute stress reaction: Secondary | ICD-10-CM | POA: Diagnosis not present

## 2018-01-02 DIAGNOSIS — R4189 Other symptoms and signs involving cognitive functions and awareness: Secondary | ICD-10-CM | POA: Diagnosis not present

## 2018-01-21 DIAGNOSIS — E119 Type 2 diabetes mellitus without complications: Secondary | ICD-10-CM | POA: Diagnosis not present

## 2018-01-28 DIAGNOSIS — R4189 Other symptoms and signs involving cognitive functions and awareness: Secondary | ICD-10-CM | POA: Diagnosis not present

## 2018-01-28 DIAGNOSIS — R251 Tremor, unspecified: Secondary | ICD-10-CM | POA: Diagnosis not present

## 2018-01-28 DIAGNOSIS — F430 Acute stress reaction: Secondary | ICD-10-CM | POA: Diagnosis not present

## 2018-01-28 DIAGNOSIS — E538 Deficiency of other specified B group vitamins: Secondary | ICD-10-CM | POA: Diagnosis not present

## 2018-02-07 ENCOUNTER — Ambulatory Visit (AMBULATORY_SURGERY_CENTER): Payer: Self-pay

## 2018-02-07 ENCOUNTER — Encounter: Payer: Self-pay | Admitting: Internal Medicine

## 2018-02-07 VITALS — Ht 60.0 in | Wt 133.6 lb

## 2018-02-07 DIAGNOSIS — Z860100 Personal history of colon polyps, unspecified: Secondary | ICD-10-CM

## 2018-02-07 NOTE — Progress Notes (Signed)
Per pt, no allergies to soy or egg products.Pt not taking any weight loss meds or using  O2 at home.  Pt refused emmi video. 

## 2018-02-18 ENCOUNTER — Encounter: Payer: PPO | Admitting: Internal Medicine

## 2018-03-21 DIAGNOSIS — I272 Pulmonary hypertension, unspecified: Secondary | ICD-10-CM | POA: Diagnosis not present

## 2018-03-25 DIAGNOSIS — R413 Other amnesia: Secondary | ICD-10-CM | POA: Diagnosis not present

## 2018-03-25 DIAGNOSIS — G250 Essential tremor: Secondary | ICD-10-CM | POA: Diagnosis not present

## 2018-03-31 DIAGNOSIS — I10 Essential (primary) hypertension: Secondary | ICD-10-CM | POA: Diagnosis not present

## 2018-03-31 DIAGNOSIS — I25118 Atherosclerotic heart disease of native coronary artery with other forms of angina pectoris: Secondary | ICD-10-CM | POA: Diagnosis not present

## 2018-03-31 DIAGNOSIS — E785 Hyperlipidemia, unspecified: Secondary | ICD-10-CM | POA: Diagnosis not present

## 2018-04-30 DIAGNOSIS — I10 Essential (primary) hypertension: Secondary | ICD-10-CM | POA: Diagnosis not present

## 2018-04-30 DIAGNOSIS — E119 Type 2 diabetes mellitus without complications: Secondary | ICD-10-CM | POA: Diagnosis not present

## 2018-04-30 DIAGNOSIS — E785 Hyperlipidemia, unspecified: Secondary | ICD-10-CM | POA: Diagnosis not present

## 2018-05-06 ENCOUNTER — Other Ambulatory Visit: Payer: Self-pay

## 2018-05-06 ENCOUNTER — Ambulatory Visit (AMBULATORY_SURGERY_CENTER): Payer: PPO | Admitting: Internal Medicine

## 2018-05-06 ENCOUNTER — Encounter: Payer: Self-pay | Admitting: Internal Medicine

## 2018-05-06 VITALS — BP 109/55 | HR 63 | Temp 98.0°F | Resp 20 | Ht 60.0 in | Wt 133.0 lb

## 2018-05-06 DIAGNOSIS — K635 Polyp of colon: Secondary | ICD-10-CM | POA: Diagnosis not present

## 2018-05-06 DIAGNOSIS — Z86010 Personal history of colonic polyps: Secondary | ICD-10-CM | POA: Diagnosis not present

## 2018-05-06 DIAGNOSIS — E119 Type 2 diabetes mellitus without complications: Secondary | ICD-10-CM | POA: Diagnosis not present

## 2018-05-06 DIAGNOSIS — I739 Peripheral vascular disease, unspecified: Secondary | ICD-10-CM | POA: Diagnosis not present

## 2018-05-06 DIAGNOSIS — I2510 Atherosclerotic heart disease of native coronary artery without angina pectoris: Secondary | ICD-10-CM | POA: Diagnosis not present

## 2018-05-06 DIAGNOSIS — J439 Emphysema, unspecified: Secondary | ICD-10-CM | POA: Diagnosis not present

## 2018-05-06 DIAGNOSIS — I10 Essential (primary) hypertension: Secondary | ICD-10-CM | POA: Diagnosis not present

## 2018-05-06 DIAGNOSIS — D125 Benign neoplasm of sigmoid colon: Secondary | ICD-10-CM

## 2018-05-06 MED ORDER — SODIUM CHLORIDE 0.9 % IV SOLN: 500.0000 mL | Freq: Once | INTRAVENOUS | Status: DC

## 2018-05-06 NOTE — Progress Notes (Signed)
Called to room to assist during endoscopic procedure.  Patient ID and intended procedure confirmed with present staff. Received instructions for my participation in the procedure from the performing physician.  

## 2018-05-06 NOTE — Patient Instructions (Addendum)
I found and removed 1 possible tiny polyp. Probably a spot of inflammation but could not be sure so removed. I will let you know pathology results and when to have another routine colonoscopy by mail and/or My Chart.  You do have diverticulosis - thickened muscle rings and pouches in the colon wall. Please read the handout about this condition.   I appreciate the opportunity to care for you. Gatha Mayer, MD, FACG  YOU HAD AN ENDOSCOPIC PROCEDURE TODAY AT [facility]:   Refer to the procedure report that was given to you for any specific questions about what was found during the examination.  If the procedure report does not answer your questions, please call your gastroenterologist to clarify.  If you requested that your care partner not be given the details of your procedure findings, then the procedure report has been included in a sealed envelope for you to review at your convenience later.  YOU SHOULD EXPECT: Some feelings of bloating in the abdomen. Passage of more gas than usual.  Walking can help get rid of the air that was put into your GI tract during the procedure and reduce the bloating. If you had a lower endoscopy (such as a colonoscopy or flexible sigmoidoscopy) you may notice spotting of blood in your stool or on the toilet paper. If you underwent a bowel prep for your procedure, you may not have a normal bowel movement for a few days.  Please Note:  You might notice some irritation and congestion in your nose or some drainage.  This is from the oxygen used during your procedure.  There is no need for concern and it should clear up in a day or so.  SYMPTOMS TO REPORT IMMEDIATELY:   Following lower endoscopy (colonoscopy or flexible sigmoidoscopy):  Excessive amounts of blood in the stool  Significant tenderness or worsening of abdominal pains  Swelling of the abdomen that is new, acute  Fever of 100F or higher   Following upper endoscopy  (EGD)  Vomiting of blood or coffee ground material  New chest pain or pain under the shoulder blades  Painful or persistently difficult swallowing  New shortness of breath  Fever of 100F or higher  Black, tarry-looking stools  For urgent or emergent issues, a gastroenterologist can be reached at any hour by calling [phone].   DIET:  We do recommend a small meal at first, but then you may proceed to your regular diet.  Drink plenty of fluids but you should avoid alcoholic beverages for 24 hours.  ACTIVITY:  You should plan to take it easy for the rest of today and you should NOT DRIVE or use heavy machinery until tomorrow (because of the sedation medicines used during the test).    FOLLOW UP: Our staff will call the number listed on your records the next business day following your procedure to check on you and address any questions or concerns that you may have regarding the information given to you following your procedure. If we do not reach you, we will leave a message.  However, if you are feeling well and you are not experiencing any problems, there is no need to return our call.  We will assume that you have returned to your regular daily activities without incident.  If any biopsies were taken you will be contacted by phone or by letter within the next 1-3 weeks.  Please call us at [phone] if you have not heard  about the biopsies in 3 weeks.    SIGNATURES/CONFIDENTIALITY: You and/or your care partner have signed paperwork which will be entered into your electronic medical record.  These signatures attest to the fact that that the information above on your After Visit Summary has been reviewed and is understood.  Full responsibility of the confidentiality of this discharge information lies with you and/or your care-partner.YOU HAD AN ENDOSCOPIC PROCEDURE TODAY AT [facility]:   Refer to the procedure report that was given to you for any specific questions  about what was found during the examination.  If the procedure report does not answer your questions, please call your gastroenterologist to clarify.  If you requested that your care partner not be given the details of your procedure findings, then the procedure report has been included in a sealed envelope for you to review at your convenience later.  YOU SHOULD EXPECT: Some feelings of bloating in the abdomen. Passage of more gas than usual.  Walking can help get rid of the air that was put into your GI tract during the procedure and reduce the bloating. If you had a lower endoscopy (such as a colonoscopy or flexible sigmoidoscopy) you may notice spotting of blood in your stool or on the toilet paper. If you underwent a bowel prep for your procedure, you may not have a normal bowel movement for a few days.  Please Note:  You might notice some irritation and congestion in your nose or some drainage.  This is from the oxygen used during your procedure.  There is no need for concern and it should clear up in a day or so.  SYMPTOMS TO REPORT IMMEDIATELY:   Following lower endoscopy (colonoscopy or flexible sigmoidoscopy):  Excessive amounts of blood in the stool  Significant tenderness or worsening of abdominal pains  Swelling of the abdomen that is new, acute  Fever of 100F or higher   For urgent or emergent issues, a gastroenterologist can be reached at any hour by calling [phone].   DIET:  We do recommend a small meal at first, but then you may proceed to your regular diet.  Drink plenty of fluids but you should avoid alcoholic beverages for 24 hours.  ACTIVITY:  You should plan to take it easy for the rest of today and you should NOT DRIVE or use heavy machinery until tomorrow (because of the sedation medicines used during the test).    FOLLOW UP: Our staff will call the number listed on your records the next business day following your procedure to check on you and address any  questions or concerns that you may have regarding the information given to you following your procedure. If we do not reach you, we will leave a message.  However, if you are feeling well and you are not experiencing any problems, there is no need to return our call.  We will assume that you have returned to your regular daily activities without incident.  If any biopsies were taken you will be contacted by phone or by letter within the next 1-3 weeks.  Please call us at [phone] if you have not heard about the biopsies in 3 weeks.    SIGNATURES/CONFIDENTIALITY: You and/or your care partner have signed paperwork which will be entered into your electronic medical record.  These signatures attest to the fact that that the information above on your After Visit Summary has been reviewed and is understood.  Full responsibility of the confidentiality  of this discharge information lies with you and/or your care-partner.  Polyp and diverticulosis information given.

## 2018-05-06 NOTE — Progress Notes (Signed)
Pt's states no medical or surgical changes since previsit or office visit. 

## 2018-05-06 NOTE — Op Note (Signed)
[facility] Patient Name: Miranda Manning Procedure Date: 05/06/2018 8:41 AM MRN: [id number] Endoscopist: Gatha Mayer , MD Age: 72 Referring MD:  Date of Birth: [birth year] Gender: Female Account #: [id number] Procedure:                Colonoscopy Indications:              High risk colon cancer surveillance: Personal                            history of colonic polyps, Last colonoscopy: 2013 Medicines:                Propofol per Anesthesia, Monitored Anesthesia Care Procedure:                Pre-Anesthesia Assessment:                           - Prior to the procedure, a History and Physical                            was performed, and patient medications and                            allergies were reviewed. The patient's tolerance of                            previous anesthesia was also reviewed. The risks                            and benefits of the procedure and the sedation                            options and risks were discussed with the patient.                            All questions were answered, and informed consent                            was obtained. Prior Anticoagulants: The patient has                            taken no previous anticoagulant or antiplatelet                            agents. ASA Grade Assessment: III - A patient with                            severe systemic disease. After reviewing the risks                            and benefits, the patient was deemed in                            satisfactory condition to undergo the procedure.  After obtaining informed consent, the colonoscope                            was passed under direct vision. Throughout the                            procedure, the patient's blood pressure, pulse, and                            oxygen saturations were monitored continuously. The                            Colonoscope was introduced through the anus and          advanced to the the cecum, identified by                            appendiceal orifice and ileocecal valve. The                            colonoscopy was performed without difficulty. The                            patient tolerated the procedure well. The quality                            of the bowel preparation was good. The bowel                            preparation used was Miralax. The ileocecal valve,                            appendiceal orifice, and rectum were photographed. Scope In: 8:53:57 AM Scope Out: 9:06:06 AM Scope Withdrawal Time: 0 hours 8 minutes 43 seconds  Total Procedure Duration: 0 hours 12 minutes 9 seconds  Findings:                 The perianal and digital rectal examinations were                            normal.                           A 1 to 2 mm polyp was found in the sigmoid colon.                            The polyp was sessile. The polyp was removed with a                            cold biopsy forceps. Resection and retrieval were                            complete. Verification of patient identification  for the specimen was done. Estimated blood loss was                            minimal.                           Many small and large-mouthed diverticula were found                            in the entire colon.                           The exam was otherwise without abnormality on                            direct and retroflexion views. Complications:            No immediate complications. Estimated Blood Loss:     Estimated blood loss was minimal. Impression:               - One 1 to 2 mm polyp in the sigmoid colon, removed                            with a cold biopsy forceps. Resected and retrieved.                           - Severe diverticulosis in the entire examined                            colon.                           - The examination was otherwise normal on direct                             and retroflexion views.                           - Personal history of colonic polyps. 2 serrated                            right colon lesions 2013 (classified as                            hyperplastic , 10 and 73mm) Recommendation:           - Patient has a contact number available for                            emergencies. The signs and symptoms of potential                            delayed complications were discussed with the                            patient. Return to normal activities tomorrow.  Written discharge instructions were provided to the                            patient.                           - Resume previous diet.                           - Continue present medications.                           - Repeat colonoscopy is recommended. The                            colonoscopy date will be determined after pathology                            results from today's exam become available for                            review. Gatha Mayer, MD 05/06/2018 9:13:59 AM This report has been signed electronically.

## 2018-05-06 NOTE — Progress Notes (Signed)
Report to PACU, RN, vss, BBS= Clear.  

## 2018-05-07 ENCOUNTER — Telehealth: Payer: Self-pay | Admitting: *Deleted

## 2018-05-07 DIAGNOSIS — S41112A Laceration without foreign body of left upper arm, initial encounter: Secondary | ICD-10-CM | POA: Diagnosis not present

## 2018-05-07 DIAGNOSIS — R001 Bradycardia, unspecified: Secondary | ICD-10-CM | POA: Diagnosis not present

## 2018-05-07 DIAGNOSIS — Z23 Encounter for immunization: Secondary | ICD-10-CM | POA: Diagnosis not present

## 2018-05-07 DIAGNOSIS — E785 Hyperlipidemia, unspecified: Secondary | ICD-10-CM | POA: Diagnosis not present

## 2018-05-07 DIAGNOSIS — I10 Essential (primary) hypertension: Secondary | ICD-10-CM | POA: Diagnosis not present

## 2018-05-07 DIAGNOSIS — Z0000 Encounter for general adult medical examination without abnormal findings: Secondary | ICD-10-CM | POA: Diagnosis not present

## 2018-05-07 NOTE — Telephone Encounter (Signed)
No answer for post procedure call back. Will attempt to call back later this afternoon. Sm 

## 2018-05-07 NOTE — Telephone Encounter (Signed)
No answer for post procedure call back. Left message for patient to call back with questions or concerns. SM 

## 2018-05-09 ENCOUNTER — Other Ambulatory Visit: Payer: Self-pay

## 2018-05-09 ENCOUNTER — Ambulatory Visit (INDEPENDENT_AMBULATORY_CARE_PROVIDER_SITE_OTHER): Payer: PPO | Admitting: Vascular Surgery

## 2018-05-09 ENCOUNTER — Ambulatory Visit (HOSPITAL_COMMUNITY)
Admission: RE | Admit: 2018-05-09 | Discharge: 2018-05-09 | Disposition: A | Discharge status: Home / Self Care | Payer: PPO | Source: Ambulatory Visit | Attending: Vascular Surgery | Admitting: Vascular Surgery

## 2018-05-09 ENCOUNTER — Encounter: Payer: Self-pay | Admitting: Vascular Surgery

## 2018-05-09 VITALS — BP 156/65 | HR 52 | Temp 97.0°F | Resp 16 | Ht 60.0 in | Wt 128.0 lb

## 2018-05-09 DIAGNOSIS — I872 Venous insufficiency (chronic) (peripheral): Secondary | ICD-10-CM

## 2018-05-09 DIAGNOSIS — I6522 Occlusion and stenosis of left carotid artery: Secondary | ICD-10-CM | POA: Insufficient documentation

## 2018-05-09 NOTE — Progress Notes (Signed)
Patient ID: Miranda Manning, female   DOB: 12-24-1946, 72 y.o.   MRN: [id number]  Reason for Consult: Carotid (1 yr. f/u Carotid bilat.)   Referred by Maryland Pink, MD  Subjective:     HPI:  Miranda Manning is a 72 y.o. female with previous carotid endarterectomy on the right side.  She now follows up for 1 year follow-up.  She continues to take her aspirin.  She also has right lower extremity varicosities and skin changes on the right that she wants to evaluated.  She does have a history of a vein stripping.  She is also has some issues with pain blood pressure in the left arm. She denies any stroke TIA or amaurosis. Past Medical History:  Diagnosis Date  . Anxiety   . CAD (coronary artery disease)   . Cataract    only in one eye per pt.  . Cerebrovascular disease   . Diverticulosis   . Emphysema lung (HCC)    no COPD per pt! - good lung fx, centrilobular emphysema on CT  . LBBB (left bundle branch block)   . Mitral regurgitation    Severe  . Other and unspecified hyperlipidemia   . Personal history of tobacco use, presenting hazards to health   . Pulmonary fibrosis ([place])    per pt, does not have -  seen on abd CT 2015  . Type II or unspecified type diabetes mellitus without mention of complication, not stated as uncontrolled   . Unspecified essential hypertension   . Vertigo    in past   Family History  Problem Relation Age of Onset  . Hypertension Mother   . Rheum arthritis Mother   . Heart attack Father 20  . COPD Father   . Allergies Son   . Heart disease Brother        before age 42  . Breast cancer Paternal Aunt   . Colon cancer Neg Hx   . Stomach cancer Neg Hx    Past Surgical History:  Procedure Laterality Date  . ABDOMINAL HYSTERECTOMY    . ANTERIOR CRUCIATE LIGAMENT REPAIR     After accident/ right knee  . BREAST BIOPSY Left 2008   benign  . CAROTID ENDARTERECTOMY     right side/ one stent  . COLONOSCOPY    . [place]   Leg  . VESICOVAGINAL  FISTULA CLOSURE W/ TAH  1981    Short Social History:  Social History   Tobacco Use  . Smoking status: Former Smoker    Packs/day: 1.00    Years: 25.00    Pack years: 25.00    Types: Cigarettes    Last attempt to quit: 02/25/1979    Years since quitting: 39.2  . Smokeless tobacco: Never Used  Substance Use Topics  . Alcohol use: No    Alcohol/week: 0.0 oz    Allergies  Allergen Reactions  . Codeine Rash    Current Outpatient Medications  Medication Sig Dispense Refill  . aspirin 81 MG tablet Take 162 mg by mouth daily.    . cholecalciferol (VITAMIN D) 400 UNITS TABS tablet Take 2,000 Units by mouth daily.     . clobetasol ointment (TEMOVATE) 9.32 % Apply 1 application topically 2 (two) times daily.     . Cranberry-Vitamin C-Vitamin E (CRANBERRY PLUS VITAMIN C) 4200-20-3 MG-MG-UNIT CAPS Take by mouth daily.      . Cyanocobalamin (VITAMIN B 12 PO) Take by mouth daily.    Marland Kitchen  ezetimibe (ZETIA) 10 MG tablet Take 0.5 tablets (5 mg total) by mouth daily. 7 tablet 1  . fluticasone (FLONASE) 50 MCG/ACT nasal spray Place 1 spray into both nostrils daily as needed.     Marland Kitchen glipiZIDE (GLUCOTROL) 5 MG 24 hr tablet Take 5 mg by mouth daily.     Marland Kitchen glucosamine-chondroitin 500-400 MG tablet Take 2 tablets by mouth daily.    . hydrochlorothiazide 25 MG tablet Take 25 mg by mouth daily.      Marland Kitchen LORazepam (ATIVAN) 0.5 MG tablet Take 0.5 mg by mouth daily as needed.     . meclizine (ANTIVERT) 25 MG tablet Take 25 mg by mouth as needed.     . Multiple Vitamin (MULTIVITAMIN) tablet Take 1 tablet by mouth daily.    . nadolol (CORGARD) 40 MG tablet Take 20 mg by mouth daily. Take 1/2 tablet (20 mg) daily    . potassium chloride (K-DUR,KLOR-CON) 10 MEQ tablet Take 10 mEq by mouth daily.     . sertraline (ZOLOFT) 25 MG tablet Take 25 mg by mouth daily.    . simvastatin (ZOCOR) 40 MG tablet Take 1 tablet (40 mg total) by mouth every evening. 90 tablet 3   No current facility-administered medications for  this visit.     Review of Systems  Constitutional:  Constitutional negative. HENT: HENT negative.  Eyes: Eyes negative.  Cardiovascular: Positive for leg swelling.  GI: Gastrointestinal negative.  Musculoskeletal: Positive for leg pain.  Skin: Positive for rash.  Neurological: Neurological negative. Hematologic: Hematologic/lymphatic negative.        Objective:  Objective   Vitals:   05/09/18 1112 05/09/18 1114  BP: (!) 177/90 (!) 156/65  Pulse: (!) 52   Resp: 16   Temp: (!) 97 F (36.1 C)   TempSrc: Oral   SpO2: 98%   Weight: 128 lb (58.1 kg)   Height: 5' (1.524 m)    Body mass index is 25 kg/m.  Physical Exam  Constitutional: She is oriented to person, place, and time. She appears well-developed.  HENT:  Head: Normocephalic.  Eyes: Pupils are equal, round, and reactive to light.  Neck: Normal range of motion. Neck supple.  Well-healed right neck incision  Cardiovascular: Normal rate.  Pulses:      Carotid pulses are 2+ on the right side, and 2+ on the left side.      Radial pulses are 2+ on the right side, and 0 on the left side.       Popliteal pulses are 2+ on the right side, and 2+ on the left side.  Abdominal: Soft. Bowel sounds are normal.  Musculoskeletal: Normal range of motion. She exhibits edema.  Neurological: She is alert and oriented to person, place, and time.  Skin:  Skin changes bilateral ankles consistent with C4 venous disease.  She also has significant varicosities in the right lower extremity.  Psychiatric: She has a normal mood and affect. Her behavior is normal. Judgment and thought content normal.    Data: I have independently interpreted her carotid duplex which demonstrates patent right carotid endarterectomy site with 40 to 59% stenosis on the left with peak systolic velocity of 485 which is similar to 1 year ago.  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     Assessment/Plan:     72 year old female follows up from  previous right carotid endarterectomy performed by Dr. Kellie Simmering.  She denies any neurologic symptoms at this time.  She has had difficulty obtaining blood pressure  in the left arm this is consistent with her subclavian stenosis.  She also has right lower extremity swelling but does not wear any compression stockings and has no interest.  She has had a previous vein stripping on the right but does continue to have varicose veins and has skin changes consistent with C4 venous disease.  Given that she has not had any bleeding or other issues other than swelling she would like to just get a venous reflux study in 1 year when she follows up with her carotid duplex.  At that time if she does have either accessory saphenous vein or small saphenous vein reflux we can consider compression and treating or she may just be a candidate for sclerotherapy.  She has issues with either stroke TIA or amaurosis or further issues with her legs we can certainly see her sooner she will otherwise follow-up in 1 year.     Waynetta Sandy MD [facility]

## 2018-05-16 ENCOUNTER — Encounter: Payer: Self-pay | Admitting: Internal Medicine

## 2018-05-16 NOTE — Progress Notes (Signed)
Distal hyperplastic Prior 10-15 mm right hyperplastic - serrated Recall 2024 - consider repeat My Chart

## 2018-05-26 ENCOUNTER — Other Ambulatory Visit: Payer: Self-pay | Admitting: Family Medicine

## 2018-05-26 DIAGNOSIS — Z1231 Encounter for screening mammogram for malignant neoplasm of breast: Secondary | ICD-10-CM

## 2018-05-29 ENCOUNTER — Ambulatory Visit
Admission: RE | Admit: 2018-05-29 | Discharge: 2018-05-29 | Disposition: A | Discharge status: Home / Self Care | Payer: PPO | Source: Ambulatory Visit | Attending: Family Medicine | Admitting: Family Medicine

## 2018-05-29 DIAGNOSIS — Z1231 Encounter for screening mammogram for malignant neoplasm of breast: Secondary | ICD-10-CM | POA: Insufficient documentation

## 2018-06-09 DIAGNOSIS — R300 Dysuria: Secondary | ICD-10-CM | POA: Diagnosis not present

## 2018-06-30 DIAGNOSIS — G250 Essential tremor: Secondary | ICD-10-CM | POA: Diagnosis not present

## 2018-06-30 DIAGNOSIS — R413 Other amnesia: Secondary | ICD-10-CM | POA: Diagnosis not present

## 2018-07-23 DIAGNOSIS — R1032 Left lower quadrant pain: Secondary | ICD-10-CM | POA: Diagnosis not present

## 2018-07-23 DIAGNOSIS — K5730 Diverticulosis of large intestine without perforation or abscess without bleeding: Secondary | ICD-10-CM | POA: Diagnosis not present

## 2018-10-07 DIAGNOSIS — I25118 Atherosclerotic heart disease of native coronary artery with other forms of angina pectoris: Secondary | ICD-10-CM | POA: Diagnosis not present

## 2018-10-07 DIAGNOSIS — I739 Peripheral vascular disease, unspecified: Secondary | ICD-10-CM | POA: Diagnosis not present

## 2018-10-07 DIAGNOSIS — I10 Essential (primary) hypertension: Secondary | ICD-10-CM | POA: Diagnosis not present

## 2018-11-10 DIAGNOSIS — I10 Essential (primary) hypertension: Secondary | ICD-10-CM | POA: Diagnosis not present

## 2018-11-10 DIAGNOSIS — R42 Dizziness and giddiness: Secondary | ICD-10-CM | POA: Diagnosis not present

## 2018-11-10 DIAGNOSIS — E119 Type 2 diabetes mellitus without complications: Secondary | ICD-10-CM | POA: Diagnosis not present

## 2018-11-10 DIAGNOSIS — E785 Hyperlipidemia, unspecified: Secondary | ICD-10-CM | POA: Diagnosis not present

## 2018-11-10 DIAGNOSIS — Z23 Encounter for immunization: Secondary | ICD-10-CM | POA: Diagnosis not present

## 2018-11-10 DIAGNOSIS — J309 Allergic rhinitis, unspecified: Secondary | ICD-10-CM | POA: Diagnosis not present

## 2018-12-31 DIAGNOSIS — R413 Other amnesia: Secondary | ICD-10-CM | POA: Diagnosis not present

## 2018-12-31 DIAGNOSIS — G250 Essential tremor: Secondary | ICD-10-CM | POA: Diagnosis not present

## 2019-04-22 DIAGNOSIS — I739 Peripheral vascular disease, unspecified: Secondary | ICD-10-CM | POA: Diagnosis not present

## 2019-04-22 DIAGNOSIS — G250 Essential tremor: Secondary | ICD-10-CM | POA: Diagnosis not present

## 2019-04-22 DIAGNOSIS — I25118 Atherosclerotic heart disease of native coronary artery with other forms of angina pectoris: Secondary | ICD-10-CM | POA: Diagnosis not present

## 2019-04-22 DIAGNOSIS — E782 Mixed hyperlipidemia: Secondary | ICD-10-CM | POA: Diagnosis not present

## 2019-04-22 DIAGNOSIS — I10 Essential (primary) hypertension: Secondary | ICD-10-CM | POA: Diagnosis not present

## 2019-06-05 DIAGNOSIS — Z0000 Encounter for general adult medical examination without abnormal findings: Secondary | ICD-10-CM | POA: Diagnosis not present

## 2019-06-22 DIAGNOSIS — E785 Hyperlipidemia, unspecified: Secondary | ICD-10-CM | POA: Diagnosis not present

## 2019-06-22 DIAGNOSIS — E119 Type 2 diabetes mellitus without complications: Secondary | ICD-10-CM | POA: Diagnosis not present

## 2019-06-22 DIAGNOSIS — I10 Essential (primary) hypertension: Secondary | ICD-10-CM | POA: Diagnosis not present

## 2019-06-24 DIAGNOSIS — Z0000 Encounter for general adult medical examination without abnormal findings: Secondary | ICD-10-CM | POA: Diagnosis not present

## 2019-06-24 DIAGNOSIS — E785 Hyperlipidemia, unspecified: Secondary | ICD-10-CM | POA: Diagnosis not present

## 2019-06-24 DIAGNOSIS — R413 Other amnesia: Secondary | ICD-10-CM | POA: Diagnosis not present

## 2019-06-24 DIAGNOSIS — I10 Essential (primary) hypertension: Secondary | ICD-10-CM | POA: Diagnosis not present

## 2019-06-24 DIAGNOSIS — E1159 Type 2 diabetes mellitus with other circulatory complications: Secondary | ICD-10-CM | POA: Diagnosis not present

## 2019-09-07 DIAGNOSIS — R0781 Pleurodynia: Secondary | ICD-10-CM | POA: Diagnosis not present

## 2019-09-15 DIAGNOSIS — H353221 Exudative age-related macular degeneration, left eye, with active choroidal neovascularization: Secondary | ICD-10-CM | POA: Diagnosis not present

## 2019-09-17 DIAGNOSIS — H353221 Exudative age-related macular degeneration, left eye, with active choroidal neovascularization: Secondary | ICD-10-CM | POA: Diagnosis not present

## 2019-09-24 ENCOUNTER — Other Ambulatory Visit: Payer: Self-pay | Admitting: Family Medicine

## 2019-09-24 DIAGNOSIS — Z1231 Encounter for screening mammogram for malignant neoplasm of breast: Secondary | ICD-10-CM

## 2019-10-19 DIAGNOSIS — H353113 Nonexudative age-related macular degeneration, right eye, advanced atrophic without subfoveal involvement: Secondary | ICD-10-CM | POA: Diagnosis not present

## 2019-10-22 DIAGNOSIS — I739 Peripheral vascular disease, unspecified: Secondary | ICD-10-CM | POA: Diagnosis not present

## 2019-10-22 DIAGNOSIS — E119 Type 2 diabetes mellitus without complications: Secondary | ICD-10-CM | POA: Diagnosis not present

## 2019-10-22 DIAGNOSIS — G250 Essential tremor: Secondary | ICD-10-CM | POA: Diagnosis not present

## 2019-10-22 DIAGNOSIS — I25118 Atherosclerotic heart disease of native coronary artery with other forms of angina pectoris: Secondary | ICD-10-CM | POA: Diagnosis not present

## 2019-10-22 DIAGNOSIS — E785 Hyperlipidemia, unspecified: Secondary | ICD-10-CM | POA: Diagnosis not present

## 2019-10-30 ENCOUNTER — Other Ambulatory Visit: Payer: Self-pay

## 2019-10-30 ENCOUNTER — Encounter: Payer: Self-pay | Admitting: Critical Care Medicine

## 2019-10-30 ENCOUNTER — Telehealth: Payer: Self-pay | Admitting: Critical Care Medicine

## 2019-10-30 ENCOUNTER — Ambulatory Visit: Payer: PPO | Admitting: Critical Care Medicine

## 2019-10-30 VITALS — BP 140/60 | HR 62 | Temp 97.3°F | Ht 60.0 in | Wt 136.0 lb

## 2019-10-30 DIAGNOSIS — J432 Centrilobular emphysema: Secondary | ICD-10-CM

## 2019-10-30 DIAGNOSIS — Z23 Encounter for immunization: Secondary | ICD-10-CM | POA: Diagnosis not present

## 2019-10-30 DIAGNOSIS — R918 Other nonspecific abnormal finding of lung field: Secondary | ICD-10-CM

## 2019-10-30 DIAGNOSIS — Z87891 Personal history of nicotine dependence: Secondary | ICD-10-CM

## 2019-10-30 MED ORDER — INCRUSE ELLIPTA 62.5 MCG/INH IN AEPB: 1.0000 | INHALATION_SPRAY | Freq: Every day | RESPIRATORY_TRACT | 11 refills | Status: DC

## 2019-10-30 MED ORDER — ALBUTEROL SULFATE HFA 108 (90 BASE) MCG/ACT IN AERS: 2.0000 | INHALATION_SPRAY | Freq: Four times a day (QID) | RESPIRATORY_TRACT | 11 refills | Status: DC | PRN

## 2019-10-30 MED ORDER — INCRUSE ELLIPTA 62.5 MCG/INH IN AEPB: 1.0000 | INHALATION_SPRAY | Freq: Every day | RESPIRATORY_TRACT | 0 refills | Status: DC

## 2019-10-30 NOTE — Patient Instructions (Addendum)
Thank you for visiting Dr. Carlis Abbott at [facility]. We recommend the following:   Meds ordered this encounter  Medications  . albuterol (VENTOLIN HFA) 108 (90 Base) MCG/ACT inhaler    Sig: Inhale 2 puffs into the lungs every 6 (six) hours as needed for wheezing or shortness of breath.    Dispense:  6.7 g    Refill:  11  . umeclidinium bromide (INCRUSE ELLIPTA) 62.5 MCG/INH AEPB    Sig: Inhale 1 puff into the lungs daily.    Dispense:  1 each    Refill:  11    Return in about 3 months (around 01/30/2020).    Please do your part to reduce the spread of COVID-19.

## 2019-10-30 NOTE — Progress Notes (Signed)
Synopsis: Referred in October 2015 for emphysema by Maryland Pink, MD.  Formerly a patient of Dr. Lake Bells.  Subjective:   PATIENT ID: Miranda Manning GENDER: female DOB: [birth year], MRN: [id number]  Chief Complaint  Patient presents with  . Pulmonary Consult    emphysema    Miranda Manning is a 73 year old woman who presents today for evaluation of emphysema.  She is accompanied by her son.  She notes since she was last seen she has lost her husband in March 2019.  She endorses chronic dyspnea on exertion that has progressively slowly worsened over time.  Her son notes that it was first noticeable to him when she was walking around [facility] during her husband's hospitalization.  She can walk ~1 block, but then has to stop due to dyspnea.  She endorses occasional wheezing, but denies coughing or sputum production.  She has never been on inhalers.  She quit smoking in 1989 after <1ppd x 25 years.  She has not yet had her seasonal flu vaccine, but is up-to-date on pneumonia vaccines.     Past Medical History:  Diagnosis Date  . Anxiety   . CAD (coronary artery disease)   . Cataract    only in one eye per pt.  . Cerebrovascular disease   . Colon polyp   . Diverticulosis   . Emphysema lung (HCC)    no COPD per pt! - good lung fx, centrilobular emphysema on CT  . LBBB (left bundle branch block)   . Mitral regurgitation    Severe  . Other and unspecified hyperlipidemia   . Personal history of tobacco use, presenting hazards to health   . Pulmonary fibrosis ([place])    per pt, does not have -  seen on abd CT 2015  . Type II or unspecified type diabetes mellitus without mention of complication, not stated as uncontrolled   . Unspecified essential hypertension   . Vertigo    in past     Family History  Problem Relation Age of Onset  . Hypertension Mother   . Rheum arthritis Mother   . Heart attack Father 69  . COPD Father   . Allergies Son   . Heart disease Brother    before age 69  . Breast cancer Paternal Aunt   . Colon cancer Neg Hx   . Stomach cancer Neg Hx      Past Surgical History:  Procedure Laterality Date  . ABDOMINAL HYSTERECTOMY    . ANTERIOR CRUCIATE LIGAMENT REPAIR     After accident/ right knee  . BREAST BIOPSY Left 2008   benign  . CAROTID ENDARTERECTOMY     right side/ one stent  . COLONOSCOPY    . [place]   Leg  . VESICOVAGINAL FISTULA CLOSURE W/ TAH  1981    Social History   Socioeconomic History  . Marital status: Married    Spouse name: Not on file  . Number of children: Not on file  . Years of education: Not on file  . Highest education level: Not on file  Occupational History  . Occupation: Secretary/administrator  Social Needs  . Financial resource strain: Not on file  . Food insecurity    Worry: Not on file    Inability: Not on file  . Transportation needs    Medical: Not on file    Non-medical: Not on file  Tobacco Use  . Smoking status: Former Smoker    Packs/day: 1.00  Years: 25.00    Pack years: 25.00    Types: Cigarettes    Quit date: 02/25/1979    Years since quitting: 40.7  . Smokeless tobacco: Never Used  Substance and Sexual Activity  . Alcohol use: No    Alcohol/week: 0.0 standard drinks  . Drug use: No  . Sexual activity: Not on file  Lifestyle  . Physical activity    Days per week: Not on file    Minutes per session: Not on file  . Stress: Not on file  Relationships  . Social Herbalist on phone: Not on file    Gets together: Not on file    Attends religious service: Not on file    Active member of club or organization: Not on file    Attends meetings of clubs or organizations: Not on file    Relationship status: Not on file  . Intimate partner violence    Fear of current or ex partner: Not on file    Emotionally abused: Not on file    Physically abused: Not on file    Forced sexual activity: Not on file  Other Topics Concern  . Not on file  Social History  Narrative   Married with 3 children   1 daughter, Miranda Manning, is a Marine scientist at [employer]   Husband, Miranda Manning, has had mitral valve replacement     Allergies  Allergen Reactions  . Codeine Rash     Immunization History  Administered Date(s) Administered  . Fluad Quad(high Dose 65+) 10/30/2019  . Influenza Split 10/25/2016  . Influenza,inj,Quad PF,6+ Mos 10/05/2014  . Pneumococcal Conjugate-13 11/08/2016  . Pneumococcal-Unspecified 10/05/2010    Outpatient Medications Prior to Visit  Medication Sig Dispense Refill  . aspirin 81 MG tablet Take 162 mg by mouth daily.    . cholecalciferol (VITAMIN D) 400 UNITS TABS tablet Take 2,000 Units by mouth daily.     . clobetasol ointment (TEMOVATE) AB-123456789 % Apply 1 application topically 2 (two) times daily.     . Cranberry-Vitamin C-Vitamin E (CRANBERRY PLUS VITAMIN C) 4200-20-3 MG-MG-UNIT CAPS Take by mouth daily.      . Cyanocobalamin (VITAMIN B 12 PO) Take by mouth daily.    Marland Kitchen ezetimibe (ZETIA) 10 MG tablet Take 0.5 tablets (5 mg total) by mouth daily. 7 tablet 1  . fluticasone (FLONASE) 50 MCG/ACT nasal spray Place 1 spray into both nostrils daily as needed.     Marland Kitchen glipiZIDE (GLUCOTROL) 5 MG 24 hr tablet Take 5 mg by mouth daily.     Marland Kitchen glucosamine-chondroitin 500-400 MG tablet Take 2 tablets by mouth daily.    . hydrochlorothiazide 25 MG tablet Take 25 mg by mouth daily.      Marland Kitchen LORazepam (ATIVAN) 0.5 MG tablet Take 0.5 mg by mouth daily as needed.     . meclizine (ANTIVERT) 25 MG tablet Take 25 mg by mouth as needed.     . Multiple Vitamin (MULTIVITAMIN) tablet Take 1 tablet by mouth daily.    . nadolol (CORGARD) 40 MG tablet Take 20 mg by mouth daily. Take 1/2 tablet (20 mg) daily    . potassium chloride (K-DUR,KLOR-CON) 10 MEQ tablet Take 10 mEq by mouth daily.     . potassium chloride (KLOR-CON) 10 MEQ tablet Take by mouth.    . sertraline (ZOLOFT) 25 MG tablet Take 25 mg by mouth daily.    . simvastatin (ZOCOR) 40 MG  tablet Take 1 tablet (40  mg total) by mouth every evening. 90 tablet 3   No facility-administered medications prior to visit.     Review of Systems  Constitutional: Negative for chills, fever and weight loss.  HENT: Negative.   Respiratory: Positive for shortness of breath and wheezing. Negative for cough and sputum production.   Cardiovascular: Negative for chest pain and leg swelling.  Gastrointestinal: Negative for blood in stool, diarrhea, heartburn, nausea and vomiting.  Genitourinary: Negative.   Musculoskeletal: Negative.   Skin: Negative for itching and rash.  Neurological: Negative.      Objective:   Vitals:   10/30/19 1032  BP: 140/60  Pulse: 62  Temp: (!) 97.3 F (36.3 C)  TempSrc: Oral  SpO2: 96%  Weight: 136 lb (61.7 kg)  Height: 5' (1.524 m)   96% on   RA BMI Readings from Last 3 Encounters:  10/30/19 26.56 kg/m  05/09/18 25.00 kg/m  05/06/18 25.97 kg/m   Wt Readings from Last 3 Encounters:  10/30/19 136 lb (61.7 kg)  05/09/18 128 lb (58.1 kg)  05/06/18 133 lb (60.3 kg)    Physical Exam Vitals signs reviewed.  Constitutional:      General: She is not in acute distress.    Appearance: Normal appearance. She is not ill-appearing.  HENT:     Head: Normocephalic and atraumatic.     Nose:     Comments: Deferred due to masking requirement.    Mouth/Throat:     Comments: Deferred due to masking requirement. Eyes:     General: No scleral icterus. Neck:     Musculoskeletal: Neck supple.  Cardiovascular:     Rate and Rhythm: Normal rate and regular rhythm.     Heart sounds: No murmur.  Pulmonary:     Comments: Breathing comfortably on room air, no conversational dyspnea or tachypnea.  Quiet breath sounds, no wheezing or rhonchi. Abdominal:     General: There is no distension.     Palpations: Abdomen is soft.     Tenderness: There is no abdominal tenderness.  Musculoskeletal:        General: No swelling or deformity.  Lymphadenopathy:      Cervical: No cervical adenopathy.  Skin:    General: Skin is warm and dry.     Findings: No rash.     Comments: Chronic stasis dermatitis and varicose veins in distal RLE  Neurological:     General: No focal deficit present.     Mental Status: She is alert.     Motor: No weakness.     Coordination: Coordination normal.  Psychiatric:        Mood and Affect: Mood normal.        Behavior: Behavior normal.       Chest Imaging- films reviewed: CT chest HR 10/22/2016- severe centrilobular and paraseptal emphysema.  RML nodule associated with linear scar and peripheral RML lateral nodule.  Linear scars in the lingula & bilateral lower lobes.  No asymmetric air trapping.  Nodules unchanged and benign.  Pulmonary Functions Testing Results: No flowsheet data found.  October 2015 pulmonary function testing ratio 72%, FEV1 1.71 L 103% predicted, TLC 109% predicted, DLCO 64% predicted  04/08/2015: FVC 2.27 (96%)-->  2.36 (100%) FEV1 1.68 (101%)-->  1.71 (103%) Ratio 74 FEF 25-75% 1.20 (59%) TLC 4.35 (109%)  RV 1.98 (123%) N2 washout DLCO 10.9 (64%) Flow volume loop suggests obstruction. No significant obstruction, but mid flows are limited and there is hyperinflation.  Echocardiogram 03/07/2010: LVEF 50 to [id number], diastolic  dysfunction.  MV prolapse, mild AR and TR.  Moderately to severely dilated LA, normal RV and RA.     Assessment & Plan:     ICD-10-CM   1. Centrilobular emphysema (HCC)  J43.2 albuterol (VENTOLIN HFA) 108 (90 Base) MCG/ACT inhaler  2. TOBACCO ABUSE, HX OF  Z87.891 albuterol (VENTOLIN HFA) 108 (90 Base) MCG/ACT inhaler  3. Pulmonary nodules  R91.8   4. Need for immunization against influenza  Z23 Flu Vaccine QUAD High Dose(Fluad)    Centrilobular emphysema; previously borderline obstruction on PFTs, and now likely has progressed to significant obstruction with age-related lung decline on top of smoking related damage. -Start Incruse once daily.  Inhaler training  provided. -Albuterol every 4 hours as needed. -Flu shot today -Continue mask wearing, handwashing, social distancing per COVID-19 recommendations.  History of tobacco abuse. History of pulmonary nodules that remained stable over 2 years. -No need for ongoing lung cancer screening given >30 years since she quit. -No ongoing evaluation required for known nodules.  RTC in 3 months.   Current Outpatient Medications:  .  aspirin 81 MG tablet, Take 162 mg by mouth daily., Disp: , Rfl:  .  cholecalciferol (VITAMIN D) 400 UNITS TABS tablet, Take 2,000 Units by mouth daily. , Disp: , Rfl:  .  clobetasol ointment (TEMOVATE) AB-123456789 %, Apply 1 application topically 2 (two) times daily. , Disp: , Rfl:  .  Cranberry-Vitamin C-Vitamin E (CRANBERRY PLUS VITAMIN C) 4200-20-3 MG-MG-UNIT CAPS, Take by mouth daily.  , Disp: , Rfl:  .  Cyanocobalamin (VITAMIN B 12 PO), Take by mouth daily., Disp: , Rfl:  .  ezetimibe (ZETIA) 10 MG tablet, Take 0.5 tablets (5 mg total) by mouth daily., Disp: 7 tablet, Rfl: 1 .  fluticasone (FLONASE) 50 MCG/ACT nasal spray, Place 1 spray into both nostrils daily as needed. , Disp: , Rfl:  .  glipiZIDE (GLUCOTROL) 5 MG 24 hr tablet, Take 5 mg by mouth daily. , Disp: , Rfl:  .  glucosamine-chondroitin 500-400 MG tablet, Take 2 tablets by mouth daily., Disp: , Rfl:  .  hydrochlorothiazide 25 MG tablet, Take 25 mg by mouth daily.  , Disp: , Rfl:  .  LORazepam (ATIVAN) 0.5 MG tablet, Take 0.5 mg by mouth daily as needed. , Disp: , Rfl:  .  meclizine (ANTIVERT) 25 MG tablet, Take 25 mg by mouth as needed. , Disp: , Rfl:  .  Multiple Vitamin (MULTIVITAMIN) tablet, Take 1 tablet by mouth daily., Disp: , Rfl:  .  nadolol (CORGARD) 40 MG tablet, Take 20 mg by mouth daily. Take 1/2 tablet (20 mg) daily, Disp: , Rfl:  .  potassium chloride (K-DUR,KLOR-CON) 10 MEQ tablet, Take 10 mEq by mouth daily. , Disp: , Rfl:  .  potassium chloride (KLOR-CON) 10 MEQ tablet, Take by mouth., Disp: , Rfl:   .  sertraline (ZOLOFT) 25 MG tablet, Take 25 mg by mouth daily., Disp: , Rfl:  .  albuterol (VENTOLIN HFA) 108 (90 Base) MCG/ACT inhaler, Inhale 2 puffs into the lungs every 6 (six) hours as needed for wheezing or shortness of breath., Disp: 6.7 g, Rfl: 11 .  simvastatin (ZOCOR) 40 MG tablet, Take 1 tablet (40 mg total) by mouth every evening., Disp: 90 tablet, Rfl: 3 .  umeclidinium bromide (INCRUSE ELLIPTA) 62.5 MCG/INH AEPB, Inhale 1 puff into the lungs daily., Disp: 1 each, Rfl: 11 .  umeclidinium bromide (INCRUSE ELLIPTA) 62.5 MCG/INH AEPB, Inhale 1 puff into the lungs daily., Disp: 2 each, Rfl:  0   Julian Hy, DO [facility] 10/30/2019 11:15 AM

## 2019-10-30 NOTE — Telephone Encounter (Signed)
PA has been started on covermymeds. Key is [id number]. Will update patient on Monday with the determination.

## 2019-11-02 MED ORDER — INCRUSE ELLIPTA 62.5 MCG/INH IN AEPB: 1.0000 | INHALATION_SPRAY | Freq: Every day | RESPIRATORY_TRACT | 5 refills | Status: AC

## 2019-11-02 NOTE — Telephone Encounter (Signed)
Checked Cover My Meds. PA is still being processed. Will continue to follow up.

## 2019-11-02 NOTE — Telephone Encounter (Signed)
[name] from [employer] with questions for prior authorization.  Has clinical questions for the meds to be reviewed.  [name] phone number is [phone] option 3. Ref number [id number].

## 2019-11-02 NOTE — Telephone Encounter (Signed)
Miranda Manning returned call for appeal for PA for Incruse.   Approved. Reference # [id number]  Approved through 12/23/2020.  Sent to pharmacy with reference number.   Call made to patient, daughter answered the phone, [name] (dpr), confirmed the patients DOB. Aware PA has been approved and sent to pharmacy. Voiced understanding.   She states they are wanting to be seen at May office at her f/u. I made her aware I would placed a recall for [place]. Nothing further needed at this time.

## 2019-11-02 NOTE — Telephone Encounter (Signed)
Dharmisha is calling back regarding previous message left. CB is [id number].

## 2019-11-03 ENCOUNTER — Ambulatory Visit
Admission: RE | Admit: 2019-11-03 | Discharge: 2019-11-03 | Disposition: A | Discharge status: Home / Self Care | Payer: PPO | Source: Ambulatory Visit | Attending: Family Medicine | Admitting: Family Medicine

## 2019-11-03 DIAGNOSIS — Z1231 Encounter for screening mammogram for malignant neoplasm of breast: Secondary | ICD-10-CM | POA: Diagnosis not present

## 2019-11-23 DIAGNOSIS — H353113 Nonexudative age-related macular degeneration, right eye, advanced atrophic without subfoveal involvement: Secondary | ICD-10-CM | POA: Diagnosis not present

## 2019-11-26 ENCOUNTER — Other Ambulatory Visit: Payer: Self-pay

## 2019-11-26 ENCOUNTER — Telehealth (HOSPITAL_COMMUNITY): Payer: Self-pay

## 2019-11-26 DIAGNOSIS — I872 Venous insufficiency (chronic) (peripheral): Secondary | ICD-10-CM

## 2019-11-26 DIAGNOSIS — I6522 Occlusion and stenosis of left carotid artery: Secondary | ICD-10-CM

## 2019-11-26 NOTE — Telephone Encounter (Signed)

## 2019-11-26 NOTE — Addendum Note (Signed)
Addended by: York Cerise C on: 11/26/2019 03:56 PM   Modules accepted: Orders

## 2019-11-27 ENCOUNTER — Encounter: Payer: Self-pay | Admitting: Vascular Surgery

## 2019-11-27 ENCOUNTER — Ambulatory Visit (HOSPITAL_COMMUNITY)
Admission: RE | Admit: 2019-11-27 | Discharge: 2019-11-27 | Disposition: A | Discharge status: Home / Self Care | Payer: PPO | Source: Ambulatory Visit | Attending: Family | Admitting: Family

## 2019-11-27 ENCOUNTER — Other Ambulatory Visit: Payer: Self-pay

## 2019-11-27 ENCOUNTER — Ambulatory Visit (INDEPENDENT_AMBULATORY_CARE_PROVIDER_SITE_OTHER): Payer: PPO | Admitting: Vascular Surgery

## 2019-11-27 ENCOUNTER — Ambulatory Visit (INDEPENDENT_AMBULATORY_CARE_PROVIDER_SITE_OTHER)
Admission: RE | Admit: 2019-11-27 | Discharge: 2019-11-27 | Disposition: A | Discharge status: Home / Self Care | Payer: PPO | Source: Ambulatory Visit | Attending: Family | Admitting: Family

## 2019-11-27 VITALS — BP 156/50 | HR 50 | Temp 97.4°F | Resp 20 | Ht 60.0 in | Wt 132.4 lb

## 2019-11-27 DIAGNOSIS — I6522 Occlusion and stenosis of left carotid artery: Secondary | ICD-10-CM | POA: Diagnosis not present

## 2019-11-27 DIAGNOSIS — I872 Venous insufficiency (chronic) (peripheral): Secondary | ICD-10-CM

## 2019-11-27 NOTE — Progress Notes (Signed)
Patient ID: Miranda Manning, female   DOB: 10/03/1946, 73 y.o.   MRN: [id number]  Reason for Consult: Follow-up   Referred by Miranda Pink, MD  Subjective:     HPI:  Miranda Manning is a 73 y.o. female history of previous carotid endarterectomy.  She also has right lower extremity varicosities and skin changes.  Does have a history of vein stripping.  Also has some issues with blood pressure in the left arm.  Never had stroke TIA or amaurosis.  Has right lower extremity venous reflux study today.  Does have significant varicose veins has not had any bleeding.  No ulceration of her legs.  Does have brown discoloration both legs  Past Medical History:  Diagnosis Date  . Anxiety   . CAD (coronary artery disease)   . Cataract    only in one eye per pt.  . Cerebrovascular disease   . Colon polyp   . Diverticulosis   . Emphysema lung (HCC)    no COPD per pt! - good lung fx, centrilobular emphysema on CT  . LBBB (left bundle branch block)   . Mitral regurgitation    Severe  . Other and unspecified hyperlipidemia   . Personal history of tobacco use, presenting hazards to health   . Pulmonary fibrosis ([place])    per pt, does not have -  seen on abd CT 2015  . Type II or unspecified type diabetes mellitus without mention of complication, not stated as uncontrolled   . Unspecified essential hypertension   . Vertigo    in past   Family History  Problem Relation Age of Onset  . Hypertension Mother   . Rheum arthritis Mother   . Heart attack Father 82  . COPD Father   . Allergies Son   . Heart disease Brother        before age 71  . Breast cancer Paternal Aunt   . Colon cancer Neg Hx   . Stomach cancer Neg Hx    Past Surgical History:  Procedure Laterality Date  . ABDOMINAL HYSTERECTOMY    . ANTERIOR CRUCIATE LIGAMENT REPAIR     After accident/ right knee  . BREAST BIOPSY Left 2008   benign  . CAROTID ENDARTERECTOMY     right side/ one stent  . COLONOSCOPY    . [place]   Leg  . VESICOVAGINAL FISTULA CLOSURE W/ TAH  1981    Short Social History:  Social History   Tobacco Use  . Smoking status: Former Smoker    Packs/day: 1.00    Years: 25.00    Pack years: 25.00    Types: Cigarettes    Quit date: 02/25/1979    Years since quitting: 40.7  . Smokeless tobacco: Never Used  Substance Use Topics  . Alcohol use: No    Alcohol/week: 0.0 standard drinks    Allergies  Allergen Reactions  . Codeine Rash    Current Outpatient Medications  Medication Sig Dispense Refill  . albuterol (VENTOLIN HFA) 108 (90 Base) MCG/ACT inhaler Inhale 2 puffs into the lungs every 6 (six) hours as needed for wheezing or shortness of breath. 6.7 g 11  . aspirin 81 MG tablet Take 162 mg by mouth daily.    . cholecalciferol (VITAMIN D) 400 UNITS TABS tablet Take 2,000 Units by mouth daily.     . clobetasol ointment (TEMOVATE) [id number] % Apply 1 application topically 2 (two) times daily.     . Cranberry-Vitamin  C-Vitamin E (CRANBERRY PLUS VITAMIN C) 4200-20-3 MG-MG-UNIT CAPS Take by mouth daily.      . Cyanocobalamin (VITAMIN B 12 PO) Take by mouth daily.    Marland Kitchen ezetimibe (ZETIA) 10 MG tablet Take 0.5 tablets (5 mg total) by mouth daily. 7 tablet 1  . fluticasone (FLONASE) 50 MCG/ACT nasal spray Place 1 spray into both nostrils daily as needed.     Marland Kitchen glipiZIDE (GLUCOTROL) 5 MG 24 hr tablet Take 5 mg by mouth daily.     Marland Kitchen glucosamine-chondroitin 500-400 MG tablet Take 2 tablets by mouth daily.    . hydrochlorothiazide 25 MG tablet Take 25 mg by mouth daily.      Marland Kitchen LORazepam (ATIVAN) 0.5 MG tablet Take 0.5 mg by mouth daily as needed.     . meclizine (ANTIVERT) 25 MG tablet Take 25 mg by mouth as needed.     . Multiple Vitamin (MULTIVITAMIN) tablet Take 1 tablet by mouth daily.    . nadolol (CORGARD) 40 MG tablet Take 20 mg by mouth daily. Take 1/2 tablet (20 mg) daily    . potassium chloride (K-DUR,KLOR-CON) 10 MEQ tablet Take 10 mEq by mouth daily.     . potassium chloride  (KLOR-CON) 10 MEQ tablet Take by mouth.    . sertraline (ZOLOFT) 25 MG tablet Take 25 mg by mouth daily.    Marland Kitchen umeclidinium bromide (INCRUSE ELLIPTA) 62.5 MCG/INH AEPB Inhale 1 puff into the lungs daily. 1 each 11  . umeclidinium bromide (INCRUSE ELLIPTA) 62.5 MCG/INH AEPB Inhale 1 puff into the lungs daily. 2 each 0  . umeclidinium bromide (INCRUSE ELLIPTA) 62.5 MCG/INH AEPB Inhale 1 puff into the lungs daily. 30 each 5  . simvastatin (ZOCOR) 40 MG tablet Take 1 tablet (40 mg total) by mouth every evening. 90 tablet 3   No current facility-administered medications for this visit.     Review of Systems  Constitutional:  Constitutional negative. HENT: HENT negative.  Eyes: Eyes negative.  Respiratory: Respiratory negative.  Cardiovascular: Positive for leg swelling.  GI: Gastrointestinal negative.  Skin: Positive for rash.  Neurological: Neurological negative. Hematologic: Hematologic/lymphatic negative.  Psychiatric: Psychiatric negative.        Objective:  Objective  Vitals:   11/27/19 1452  BP: (!) 156/50  Pulse: (!) 50  Resp: 20  Temp: (!) 97.4 F (36.3 C)  SpO2: 97%    Physical Exam HENT:     Head: Normocephalic.     Nose: Nose normal.     Mouth/Throat:     Mouth: Mucous membranes are moist.  Eyes:     Pupils: Pupils are equal, round, and reactive to light.  Neck:     Vascular: No carotid bruit.  Cardiovascular:     Rate and Rhythm: Normal rate.  Pulmonary:     Effort: Pulmonary effort is normal.  Abdominal:     General: Abdomen is flat.     Palpations: Abdomen is soft.  Musculoskeletal: Normal range of motion.     Right lower leg: Edema present.  Skin:    General: Skin is warm.     Capillary Refill: Capillary refill takes less than 2 seconds.     Comments: Skin changes consistent with C4 venous disease bilaterally  Neurological:     General: No focal deficit present.     Mental Status: She is alert.  Psychiatric:        Mood and Affect: Mood normal.         Behavior: Behavior normal.  Thought Content: Thought content normal.        Judgment: Judgment normal.     Data: I have independently turbid her bilateral carotid duplexes which demonstrate 1 to 39% stenosis at her previous endarterectomy site on the right 40 to 59% stenosis on the left with peak systolic velocity [id number].  There is antegrade flow in the right vertebral artery left vertebral artery demonstrates bidirectional flow.  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  I also independently interpreted her right lower extremity venous reflux study.  On the left she has had stripping.  On the right the vein measures 0.56 cm at the junction and 0.6 at the knee.  She has greater than 500 ms of reflux throughout the greater saphenous vein on the right.     Assessment/Plan:     73 year old female previous right carotid endarterectomy with stable stenosis now in the left.  No new stroke, TIA amaurosis.  She does have right greater saphenous vein with reflux measuring suitable size for intervention.  She would like to avoid intervention at this time unless absolutely necessary.  She will call if she has issues with this she will otherwise follow-up in 1 year with repeat carotid duplex.     Waynetta Sandy MD [facility]

## 2019-12-10 DIAGNOSIS — I10 Essential (primary) hypertension: Secondary | ICD-10-CM | POA: Diagnosis not present

## 2019-12-10 DIAGNOSIS — E1159 Type 2 diabetes mellitus with other circulatory complications: Secondary | ICD-10-CM | POA: Diagnosis not present

## 2019-12-15 DIAGNOSIS — E785 Hyperlipidemia, unspecified: Secondary | ICD-10-CM | POA: Diagnosis not present

## 2019-12-15 DIAGNOSIS — E119 Type 2 diabetes mellitus without complications: Secondary | ICD-10-CM | POA: Diagnosis not present

## 2019-12-15 DIAGNOSIS — I10 Essential (primary) hypertension: Secondary | ICD-10-CM | POA: Diagnosis not present

## 2019-12-15 DIAGNOSIS — Z0000 Encounter for general adult medical examination without abnormal findings: Secondary | ICD-10-CM | POA: Diagnosis not present

## 2019-12-21 ENCOUNTER — Other Ambulatory Visit: Payer: Self-pay | Admitting: *Deleted

## 2019-12-21 DIAGNOSIS — I6522 Occlusion and stenosis of left carotid artery: Secondary | ICD-10-CM

## 2020-02-22 DIAGNOSIS — H353113 Nonexudative age-related macular degeneration, right eye, advanced atrophic without subfoveal involvement: Secondary | ICD-10-CM | POA: Diagnosis not present

## 2020-04-21 DIAGNOSIS — G250 Essential tremor: Secondary | ICD-10-CM | POA: Diagnosis not present

## 2020-04-21 DIAGNOSIS — I447 Left bundle-branch block, unspecified: Secondary | ICD-10-CM | POA: Diagnosis not present

## 2020-04-21 DIAGNOSIS — I739 Peripheral vascular disease, unspecified: Secondary | ICD-10-CM | POA: Diagnosis not present

## 2020-04-21 DIAGNOSIS — E119 Type 2 diabetes mellitus without complications: Secondary | ICD-10-CM | POA: Diagnosis not present

## 2020-04-21 DIAGNOSIS — I10 Essential (primary) hypertension: Secondary | ICD-10-CM | POA: Diagnosis not present

## 2020-04-21 DIAGNOSIS — J432 Centrilobular emphysema: Secondary | ICD-10-CM | POA: Diagnosis not present

## 2020-04-21 DIAGNOSIS — I25118 Atherosclerotic heart disease of native coronary artery with other forms of angina pectoris: Secondary | ICD-10-CM | POA: Diagnosis not present

## 2020-04-21 DIAGNOSIS — I340 Nonrheumatic mitral (valve) insufficiency: Secondary | ICD-10-CM | POA: Diagnosis not present

## 2020-05-24 DIAGNOSIS — H353113 Nonexudative age-related macular degeneration, right eye, advanced atrophic without subfoveal involvement: Secondary | ICD-10-CM | POA: Diagnosis not present

## 2020-06-07 DIAGNOSIS — E119 Type 2 diabetes mellitus without complications: Secondary | ICD-10-CM | POA: Diagnosis not present

## 2020-06-22 DIAGNOSIS — M546 Pain in thoracic spine: Secondary | ICD-10-CM | POA: Diagnosis not present

## 2020-06-22 DIAGNOSIS — G250 Essential tremor: Secondary | ICD-10-CM | POA: Diagnosis not present

## 2020-06-22 DIAGNOSIS — I878 Other specified disorders of veins: Secondary | ICD-10-CM | POA: Diagnosis not present

## 2020-06-22 DIAGNOSIS — R0602 Shortness of breath: Secondary | ICD-10-CM | POA: Diagnosis not present

## 2020-06-22 DIAGNOSIS — E1159 Type 2 diabetes mellitus with other circulatory complications: Secondary | ICD-10-CM | POA: Diagnosis not present

## 2020-06-22 DIAGNOSIS — E785 Hyperlipidemia, unspecified: Secondary | ICD-10-CM | POA: Diagnosis not present

## 2020-06-22 DIAGNOSIS — J439 Emphysema, unspecified: Secondary | ICD-10-CM | POA: Diagnosis not present

## 2020-06-22 DIAGNOSIS — I517 Cardiomegaly: Secondary | ICD-10-CM | POA: Diagnosis not present

## 2020-06-22 DIAGNOSIS — I10 Essential (primary) hypertension: Secondary | ICD-10-CM | POA: Diagnosis not present

## 2020-06-22 DIAGNOSIS — M47814 Spondylosis without myelopathy or radiculopathy, thoracic region: Secondary | ICD-10-CM | POA: Diagnosis not present

## 2020-06-22 DIAGNOSIS — I700 Atherosclerosis of aorta: Secondary | ICD-10-CM | POA: Diagnosis not present

## 2020-06-22 DIAGNOSIS — I25118 Atherosclerotic heart disease of native coronary artery with other forms of angina pectoris: Secondary | ICD-10-CM | POA: Diagnosis not present

## 2020-06-22 DIAGNOSIS — I509 Heart failure, unspecified: Secondary | ICD-10-CM | POA: Diagnosis not present

## 2020-06-22 DIAGNOSIS — J432 Centrilobular emphysema: Secondary | ICD-10-CM | POA: Diagnosis not present

## 2020-06-22 DIAGNOSIS — M4185 Other forms of scoliosis, thoracolumbar region: Secondary | ICD-10-CM | POA: Diagnosis not present

## 2020-07-19 DIAGNOSIS — I10 Essential (primary) hypertension: Secondary | ICD-10-CM | POA: Diagnosis not present

## 2020-07-19 DIAGNOSIS — R079 Chest pain, unspecified: Secondary | ICD-10-CM | POA: Diagnosis not present

## 2020-07-19 DIAGNOSIS — I447 Left bundle-branch block, unspecified: Secondary | ICD-10-CM | POA: Diagnosis not present

## 2020-07-19 DIAGNOSIS — I340 Nonrheumatic mitral (valve) insufficiency: Secondary | ICD-10-CM | POA: Diagnosis not present

## 2020-07-19 DIAGNOSIS — E119 Type 2 diabetes mellitus without complications: Secondary | ICD-10-CM | POA: Diagnosis not present

## 2020-07-19 DIAGNOSIS — I25118 Atherosclerotic heart disease of native coronary artery with other forms of angina pectoris: Secondary | ICD-10-CM | POA: Diagnosis not present

## 2020-07-19 DIAGNOSIS — R0602 Shortness of breath: Secondary | ICD-10-CM | POA: Diagnosis not present

## 2020-07-19 DIAGNOSIS — J432 Centrilobular emphysema: Secondary | ICD-10-CM | POA: Diagnosis not present

## 2020-07-19 DIAGNOSIS — G250 Essential tremor: Secondary | ICD-10-CM | POA: Diagnosis not present

## 2020-09-21 ENCOUNTER — Other Ambulatory Visit: Payer: Self-pay

## 2020-09-21 ENCOUNTER — Telehealth: Payer: Self-pay | Admitting: Internal Medicine

## 2020-09-21 ENCOUNTER — Encounter: Payer: Self-pay | Admitting: Internal Medicine

## 2020-09-21 ENCOUNTER — Ambulatory Visit: Payer: PPO | Admitting: Internal Medicine

## 2020-09-21 VITALS — BP 134/68 | HR 67 | Temp 98.0°F | Ht 60.0 in | Wt 130.0 lb

## 2020-09-21 DIAGNOSIS — J449 Chronic obstructive pulmonary disease, unspecified: Secondary | ICD-10-CM

## 2020-09-21 DIAGNOSIS — R918 Other nonspecific abnormal finding of lung field: Secondary | ICD-10-CM | POA: Diagnosis not present

## 2020-09-21 DIAGNOSIS — J432 Centrilobular emphysema: Secondary | ICD-10-CM

## 2020-09-21 DIAGNOSIS — Z87891 Personal history of nicotine dependence: Secondary | ICD-10-CM

## 2020-09-21 MED ORDER — ALBUTEROL SULFATE HFA 108 (90 BASE) MCG/ACT IN AERS: 2.0000 | INHALATION_SPRAY | Freq: Four times a day (QID) | RESPIRATORY_TRACT | 11 refills | Status: DC | PRN

## 2020-09-21 NOTE — Telephone Encounter (Signed)
Patient is aware that we do not have samples of ventolin.  Rx for Ventolin has been sent to preferred pharmacy per patient request.  Nothing further needed.

## 2020-09-21 NOTE — Progress Notes (Signed)
Synopsis: Referred in October 2015 for emphysema by [place] Pink, MD.  Formerly a patient of Dr. Lake Bells.  Subjective:   PATIENT ID: Miranda Manning GENDER: female DOB: 06-Jun-1946, MRN: [id number]   CC  follow up COPD   Miranda Manning is a 74 year old woman who presents today for evaluation of emphysema.  She is accompanied by her son.  She notes since she was last seen she has lost her husband in March 2019.  She endorses chronic dyspnea on exertion that has progressively slowly worsened over time.  Her son notes that it was first noticeable to him when she was walking around [facility] during her husband's hospitalization.  She can walk ~1 block, but then has to stop due to dyspnea.  She endorses occasional wheezing, but denies coughing or sputum production.  She has never been on inhalers.  She quit smoking in 1989 after <1ppd x 25 years.  She has not yet had her seasonal flu vaccine, but is up-to-date on pneumonia vaccines.  No exacerbation at this time No evidence of heart failure at this time No evidence or signs of infection at this time No respiratory distress No fevers, chills, nausea, vomiting, diarrhea No evidence of lower extremity edema No evidence hemoptysis  Patient was seen by one of our providers in [place] and was supposed to take Incruse inhaler but patient felt like she did not need it, she did not start it Patient states she was exposed to humidity and caused her to be short of breath and had some wheezing but since then she has not had any respiratory distress or decline or respiratory symptoms      Past Medical History:  Diagnosis Date  . Anxiety   . CAD (coronary artery disease)   . Cataract    only in one eye per pt.  . Cerebrovascular disease   . Colon polyp   . Diverticulosis   . Emphysema lung (HCC)    no COPD per pt! - good lung fx, centrilobular emphysema on CT  . LBBB (left bundle branch block)   . Mitral regurgitation    Severe  . Other  and unspecified hyperlipidemia   . Personal history of tobacco use, presenting hazards to health   . Pulmonary fibrosis ([place])    per pt, does not have -  seen on abd CT 2015  . Type II or unspecified type diabetes mellitus without mention of complication, not stated as uncontrolled   . Unspecified essential hypertension   . Vertigo    in past      Allergies  Allergen Reactions  . Codeine Rash     Immunization History  Administered Date(s) Administered  . Fluad Quad(high Dose 65+) 10/30/2019  . Influenza Inj Mdck Quad Pf 10/29/2016, 11/10/2018  . Influenza Split 12/28/2015, 10/25/2016  . Influenza,inj,Quad PF,6+ Mos 10/05/2014  . Influenza-Unspecified 10/05/2014, 10/24/2017  . PFIZER SARS-COV-2 Vaccination 12/30/2019, 01/20/2020  . Pneumococcal Conjugate-13 10/25/2015, 11/08/2016  . Pneumococcal Polysaccharide-23 05/07/2018  . Pneumococcal-Unspecified 10/05/2010  . Td 05/07/2018    Outpatient Medications Prior to Visit  Medication Sig Dispense Refill  . albuterol (VENTOLIN HFA) 108 (90 Base) MCG/ACT inhaler Inhale 2 puffs into the lungs every 6 (six) hours as needed for wheezing or shortness of breath. 6.7 g 11  . aspirin 81 MG tablet Take 162 mg by mouth daily.    . cholecalciferol (VITAMIN D) 400 UNITS TABS tablet Take 2,000 Units by mouth daily.     . clobetasol ointment (  TEMOVATE) 8.34 % Apply 1 application topically 2 (two) times daily.     . Cranberry-Vitamin C-Vitamin E (CRANBERRY PLUS VITAMIN C) 4200-20-3 MG-MG-UNIT CAPS Take by mouth daily.      . Cyanocobalamin (VITAMIN B 12 PO) Take by mouth daily.    Marland Kitchen ezetimibe (ZETIA) 10 MG tablet Take 0.5 tablets (5 mg total) by mouth daily. 7 tablet 1  . fluticasone (FLONASE) 50 MCG/ACT nasal spray Place 1 spray into both nostrils daily as needed.     Marland Kitchen glipiZIDE (GLUCOTROL) 5 MG 24 hr tablet Take 5 mg by mouth daily.     Marland Kitchen glucosamine-chondroitin 500-400 MG tablet Take 2 tablets by mouth daily.    . hydrochlorothiazide 25 MG  tablet Take 25 mg by mouth daily.      Marland Kitchen LORazepam (ATIVAN) 0.5 MG tablet Take 0.5 mg by mouth daily as needed.     . meclizine (ANTIVERT) 25 MG tablet Take 25 mg by mouth as needed.     . Multiple Vitamin (MULTIVITAMIN) tablet Take 1 tablet by mouth daily.    . nadolol (CORGARD) 40 MG tablet Take 20 mg by mouth daily. Take 1/2 tablet (20 mg) daily    . potassium chloride (K-DUR,KLOR-CON) 10 MEQ tablet Take 10 mEq by mouth daily.     . potassium chloride (KLOR-CON) 10 MEQ tablet Take by mouth.    . sertraline (ZOLOFT) 25 MG tablet Take 25 mg by mouth daily.    Marland Kitchen umeclidinium bromide (INCRUSE ELLIPTA) 62.5 MCG/INH AEPB Inhale 1 puff into the lungs daily. 1 each 11  . umeclidinium bromide (INCRUSE ELLIPTA) 62.5 MCG/INH AEPB Inhale 1 puff into the lungs daily. 2 each 0  . simvastatin (ZOCOR) 40 MG tablet Take 1 tablet (40 mg total) by mouth every evening. 90 tablet 3   No facility-administered medications prior to visit.    Review of Systems  Constitutional: Negative for chills, fever and weight loss.  HENT: Negative.   Respiratory: Negative for cough, sputum production, shortness of breath and wheezing.   Cardiovascular: Negative for chest pain and leg swelling.  Gastrointestinal: Negative for blood in stool, diarrhea, heartburn, nausea and vomiting.  Genitourinary: Negative.   Musculoskeletal: Negative.   Skin: Negative for itching and rash.  Neurological: Negative.    BP 134/68 (BP Location: Left Arm, Cuff Size: Normal)   Pulse 67   Temp 98 F (36.7 C) (Temporal)   Ht 5' (1.524 m)   Wt 130 lb (59 kg)   SpO2 97%   BMI 25.39 kg/m     on   RA BMI Readings from Last 3 Encounters:  11/27/19 25.86 kg/m  10/30/19 26.56 kg/m  05/09/18 25.00 kg/m   Wt Readings from Last 3 Encounters:  11/27/19 132 lb 6.4 oz (60.1 kg)  10/30/19 136 lb (61.7 kg)  05/09/18 128 lb (58.1 kg)    Physical Examination:   General Appearance: No distress  Neuro:without focal findings,  speech  normal,  HEENT: PERRLA, EOM intact.   Pulmonary: normal breath sounds, No wheezing.  Cardiovascular+SYSTOLIC MURMUR   Abdomen: Benign, Soft, non-tender. Renal:  No costovertebral tenderness  GU:  Not performed at this time. Endoc: No evident thyromegaly Skin:   warm, no rashes, no ecchymosis  Extremities: normal, no cyanosis, clubbing. PSYCHIATRIC: Mood, affect within normal limits.   ALL OTHER ROS ARE NEGATIVE    Chest Imaging- films reviewed: CT chest 10/22/2016- severe centrilobular and paraseptal emphysema.  RML nodule associated with linear scar and peripheral RML lateral nodule.  Linear scars in the lingula & bilateral lower lobes.  No asymmetric air trapping.  Nodules unchanged and benign.  Pulmonary Functions Testing Results: No flowsheet data found.  October 2015 pulmonary function testing ratio 72%, FEV1 1.71 L 103% predicted, TLC 109% predicted, DLCO 64% predicted  04/08/2015: FVC 2.27 (96%)-->  2.36 (100%) FEV1 1.68 (101%)-->  1.71 (103%) Ratio 74 FEF 25-75% 1.20 (59%) TLC 4.35 (109%)  RV 1.98 (123%) N2 washout DLCO 10.9 (64%) Flow volume loop suggests obstruction. No significant obstruction, but mid flows are limited and there is hyperinflation.  Echocardiogram 03/07/2010: LVEF 50 to 42%, diastolic dysfunction.  MV prolapse, mild AR and TR.  Moderately to severely dilated LA, normal RV and RA.     Assessment & Plan:   COPD Centrilobular emphysema previously borderline obstruction on PFTs now likely has slightly progressed to obstructive lung disease with age-related lung decline in conjunction with smoking-related damage  At this time patient does not have any significant respiratory compromise and she feels like maintenance inhaler therapy is not needed at this time.  At this time I do recommend albuterol inhaler as needed every 4 hours No indication for prednisone at this time No indication for antibiotics at this time No evidence of exacerbation at this  time   History of tobacco abuse History of pulmonary nodules It seems that the stability of these nodules was over 2 years however will obtain CT of the chest to assess current Patient was in the lung cancer screening referral program 2015 2016 and 2017 Patient would like to reassess lung findings for years after her previous CT scan   COVID-19 EDUCATION: The signs and symptoms of COVID-19 were discussed with the patient and how to seek care for testing.  The importance of social distancing was discussed today. Hand Washing Techniques and avoid touching face was advised.     MEDICATION ADJUSTMENTS/LABS AND TESTS ORDERED:    CURRENT MEDICATIONS REVIEWED AT LENGTH WITH PATIENT TODAY Albuterol as needed Avoid sick contacts Avoid humidity Follow-up CT chest  Patient satisfied with Plan of action and management. All questions answered  Follow up in 6 months  TOTAL TIME SPENT 32 mins   Maretta Bees Patricia Pesa, M.D.  [facility]  Medical Director Wall Director [facility] Cardio-Pulmonary Department

## 2020-09-21 NOTE — Patient Instructions (Signed)
Albuterol as needed Avoid sick contacts Avoid humidity Follow-up CT chest

## 2020-10-04 ENCOUNTER — Ambulatory Visit
Admission: RE | Admit: 2020-10-04 | Discharge: 2020-10-04 | Disposition: A | Discharge status: Home / Self Care | Payer: PPO | Source: Ambulatory Visit | Attending: Internal Medicine | Admitting: Internal Medicine

## 2020-10-04 ENCOUNTER — Other Ambulatory Visit: Payer: Self-pay

## 2020-10-04 DIAGNOSIS — I2510 Atherosclerotic heart disease of native coronary artery without angina pectoris: Secondary | ICD-10-CM | POA: Diagnosis not present

## 2020-10-04 DIAGNOSIS — J984 Other disorders of lung: Secondary | ICD-10-CM | POA: Diagnosis not present

## 2020-10-04 DIAGNOSIS — R918 Other nonspecific abnormal finding of lung field: Secondary | ICD-10-CM | POA: Insufficient documentation

## 2020-10-04 DIAGNOSIS — J439 Emphysema, unspecified: Secondary | ICD-10-CM | POA: Diagnosis not present

## 2020-10-04 DIAGNOSIS — I700 Atherosclerosis of aorta: Secondary | ICD-10-CM | POA: Diagnosis not present

## 2020-10-12 DIAGNOSIS — E785 Hyperlipidemia, unspecified: Secondary | ICD-10-CM | POA: Diagnosis not present

## 2020-10-12 DIAGNOSIS — I447 Left bundle-branch block, unspecified: Secondary | ICD-10-CM | POA: Diagnosis not present

## 2020-10-12 DIAGNOSIS — I340 Nonrheumatic mitral (valve) insufficiency: Secondary | ICD-10-CM | POA: Diagnosis not present

## 2020-10-12 DIAGNOSIS — E119 Type 2 diabetes mellitus without complications: Secondary | ICD-10-CM | POA: Diagnosis not present

## 2020-10-12 DIAGNOSIS — I10 Essential (primary) hypertension: Secondary | ICD-10-CM | POA: Diagnosis not present

## 2020-10-12 DIAGNOSIS — I25118 Atherosclerotic heart disease of native coronary artery with other forms of angina pectoris: Secondary | ICD-10-CM | POA: Diagnosis not present

## 2020-10-12 DIAGNOSIS — J432 Centrilobular emphysema: Secondary | ICD-10-CM | POA: Diagnosis not present

## 2020-10-14 ENCOUNTER — Other Ambulatory Visit: Payer: Self-pay | Admitting: Cardiology

## 2020-10-14 DIAGNOSIS — I25118 Atherosclerotic heart disease of native coronary artery with other forms of angina pectoris: Secondary | ICD-10-CM

## 2020-10-14 DIAGNOSIS — L820 Inflamed seborrheic keratosis: Secondary | ICD-10-CM | POA: Diagnosis not present

## 2020-10-25 ENCOUNTER — Telehealth (HOSPITAL_COMMUNITY): Payer: Self-pay | Admitting: Emergency Medicine

## 2020-10-25 NOTE — Telephone Encounter (Signed)
Attempted to call patient regarding upcoming cardiac CT appointment. °Left message on voicemail with name and callback number °Shenandoah Vandergriff RN Navigator Cardiac Imaging °[facility] °[phone] Office °[phone] Cell ° °

## 2020-10-26 ENCOUNTER — Telehealth (HOSPITAL_COMMUNITY): Payer: Self-pay | Admitting: Emergency Medicine

## 2020-10-26 NOTE — Telephone Encounter (Signed)
Pt returning phone call regarding upcoming cardiac imaging study; pt verbalizes understanding of appt date/time, parking situation and where to check in, pre-test NPO status and medications ordered, and verified current allergies; name and call back number provided for further questions should they arise Marchia Bond RN Navigator Cardiac Imaging [facility] [phone] office [phone] cell   Hold hctz

## 2020-10-27 ENCOUNTER — Ambulatory Visit
Admission: RE | Admit: 2020-10-27 | Discharge: 2020-10-27 | Disposition: A | Discharge status: Home / Self Care | Payer: PPO | Source: Ambulatory Visit | Attending: Cardiology | Admitting: Cardiology

## 2020-10-27 ENCOUNTER — Other Ambulatory Visit: Payer: Self-pay

## 2020-10-27 DIAGNOSIS — I25118 Atherosclerotic heart disease of native coronary artery with other forms of angina pectoris: Secondary | ICD-10-CM

## 2020-10-27 MED ORDER — METOPROLOL TARTRATE 5 MG/5ML IV SOLN
10.0000 mg | Freq: Once | INTRAVENOUS | Status: AC
  Administered 2020-10-27: 10 mg via INTRAVENOUS

## 2020-10-27 MED ORDER — NITROGLYCERIN 0.4 MG SL SUBL
0.8000 mg | SUBLINGUAL_TABLET | Freq: Once | SUBLINGUAL | Status: AC
  Administered 2020-10-27: 0.8 mg via SUBLINGUAL

## 2020-10-27 MED ORDER — DILTIAZEM HCL 25 MG/5ML IV SOLN: 10.0000 mg | Freq: Once | INTRAVENOUS | Status: DC

## 2020-10-27 MED ORDER — IOHEXOL 350 MG/ML SOLN
75.0000 mL | Freq: Once | INTRAVENOUS | Status: AC | PRN
  Administered 2020-10-27: 75 mL via INTRAVENOUS

## 2020-10-27 NOTE — Progress Notes (Signed)
Patient tolerated procedure well. Ambulate w/o difficulty. Sitting in chair drinking water provided. Encouraged to drink extra water today and reasoning explained. Verbalized understanding. All questions answered. ABC intact. No further needs. Discharge from procedure area w/o issues.  

## 2020-10-28 DIAGNOSIS — I25118 Atherosclerotic heart disease of native coronary artery with other forms of angina pectoris: Secondary | ICD-10-CM | POA: Diagnosis not present

## 2020-11-01 DIAGNOSIS — I10 Essential (primary) hypertension: Secondary | ICD-10-CM | POA: Diagnosis not present

## 2020-11-01 DIAGNOSIS — I340 Nonrheumatic mitral (valve) insufficiency: Secondary | ICD-10-CM | POA: Diagnosis not present

## 2020-11-01 DIAGNOSIS — I447 Left bundle-branch block, unspecified: Secondary | ICD-10-CM | POA: Diagnosis not present

## 2020-11-01 DIAGNOSIS — I25118 Atherosclerotic heart disease of native coronary artery with other forms of angina pectoris: Secondary | ICD-10-CM | POA: Diagnosis not present

## 2020-11-02 DIAGNOSIS — I25118 Atherosclerotic heart disease of native coronary artery with other forms of angina pectoris: Secondary | ICD-10-CM | POA: Diagnosis not present

## 2020-11-11 DIAGNOSIS — I5020 Unspecified systolic (congestive) heart failure: Secondary | ICD-10-CM | POA: Diagnosis not present

## 2020-11-11 DIAGNOSIS — E1151 Type 2 diabetes mellitus with diabetic peripheral angiopathy without gangrene: Secondary | ICD-10-CM | POA: Diagnosis not present

## 2020-11-11 DIAGNOSIS — I110 Hypertensive heart disease with heart failure: Secondary | ICD-10-CM | POA: Diagnosis not present

## 2020-11-11 DIAGNOSIS — E785 Hyperlipidemia, unspecified: Secondary | ICD-10-CM | POA: Diagnosis not present

## 2020-11-11 DIAGNOSIS — I25118 Atherosclerotic heart disease of native coronary artery with other forms of angina pectoris: Secondary | ICD-10-CM | POA: Diagnosis not present

## 2020-11-11 DIAGNOSIS — I447 Left bundle-branch block, unspecified: Secondary | ICD-10-CM | POA: Diagnosis not present

## 2020-11-11 DIAGNOSIS — I340 Nonrheumatic mitral (valve) insufficiency: Secondary | ICD-10-CM | POA: Diagnosis not present

## 2020-11-11 DIAGNOSIS — J439 Emphysema, unspecified: Secondary | ICD-10-CM | POA: Diagnosis not present

## 2020-11-25 DIAGNOSIS — F0280 Dementia in other diseases classified elsewhere without behavioral disturbance: Secondary | ICD-10-CM | POA: Diagnosis not present

## 2020-11-25 DIAGNOSIS — J811 Chronic pulmonary edema: Secondary | ICD-10-CM | POA: Diagnosis not present

## 2020-11-25 DIAGNOSIS — E119 Type 2 diabetes mellitus without complications: Secondary | ICD-10-CM | POA: Diagnosis not present

## 2020-11-25 DIAGNOSIS — I2583 Coronary atherosclerosis due to lipid rich plaque: Secondary | ICD-10-CM | POA: Diagnosis not present

## 2020-11-25 DIAGNOSIS — I480 Paroxysmal atrial fibrillation: Secondary | ICD-10-CM | POA: Diagnosis not present

## 2020-11-25 DIAGNOSIS — I440 Atrioventricular block, first degree: Secondary | ICD-10-CM | POA: Diagnosis not present

## 2020-11-25 DIAGNOSIS — E11649 Type 2 diabetes mellitus with hypoglycemia without coma: Secondary | ICD-10-CM | POA: Diagnosis not present

## 2020-11-25 DIAGNOSIS — R918 Other nonspecific abnormal finding of lung field: Secondary | ICD-10-CM | POA: Diagnosis not present

## 2020-11-25 DIAGNOSIS — R339 Retention of urine, unspecified: Secondary | ICD-10-CM | POA: Diagnosis not present

## 2020-11-25 DIAGNOSIS — Z952 Presence of prosthetic heart valve: Secondary | ICD-10-CM | POA: Diagnosis not present

## 2020-11-25 DIAGNOSIS — Z452 Encounter for adjustment and management of vascular access device: Secondary | ICD-10-CM | POA: Diagnosis not present

## 2020-11-25 DIAGNOSIS — D72829 Elevated white blood cell count, unspecified: Secondary | ICD-10-CM | POA: Diagnosis not present

## 2020-11-25 DIAGNOSIS — R0600 Dyspnea, unspecified: Secondary | ICD-10-CM | POA: Diagnosis not present

## 2020-11-25 DIAGNOSIS — F32A Depression, unspecified: Secondary | ICD-10-CM | POA: Diagnosis not present

## 2020-11-25 DIAGNOSIS — J90 Pleural effusion, not elsewhere classified: Secondary | ICD-10-CM | POA: Diagnosis not present

## 2020-11-25 DIAGNOSIS — R001 Bradycardia, unspecified: Secondary | ICD-10-CM | POA: Diagnosis not present

## 2020-11-25 DIAGNOSIS — R739 Hyperglycemia, unspecified: Secondary | ICD-10-CM | POA: Diagnosis not present

## 2020-11-25 DIAGNOSIS — I442 Atrioventricular block, complete: Secondary | ICD-10-CM | POA: Diagnosis not present

## 2020-11-25 DIAGNOSIS — I739 Peripheral vascular disease, unspecified: Secondary | ICD-10-CM | POA: Diagnosis not present

## 2020-11-25 DIAGNOSIS — R41841 Cognitive communication deficit: Secondary | ICD-10-CM | POA: Diagnosis not present

## 2020-11-25 DIAGNOSIS — Z951 Presence of aortocoronary bypass graft: Secondary | ICD-10-CM | POA: Diagnosis not present

## 2020-11-25 DIAGNOSIS — G4700 Insomnia, unspecified: Secondary | ICD-10-CM | POA: Diagnosis not present

## 2020-11-25 DIAGNOSIS — I447 Left bundle-branch block, unspecified: Secondary | ICD-10-CM | POA: Diagnosis not present

## 2020-11-25 DIAGNOSIS — I340 Nonrheumatic mitral (valve) insufficiency: Secondary | ICD-10-CM | POA: Diagnosis not present

## 2020-11-25 DIAGNOSIS — I25118 Atherosclerotic heart disease of native coronary artery with other forms of angina pectoris: Secondary | ICD-10-CM | POA: Diagnosis not present

## 2020-11-25 DIAGNOSIS — I708 Atherosclerosis of other arteries: Secondary | ICD-10-CM | POA: Diagnosis not present

## 2020-11-25 DIAGNOSIS — I4892 Unspecified atrial flutter: Secondary | ICD-10-CM | POA: Diagnosis not present

## 2020-11-25 DIAGNOSIS — E1151 Type 2 diabetes mellitus with diabetic peripheral angiopathy without gangrene: Secondary | ICD-10-CM | POA: Diagnosis not present

## 2020-11-25 DIAGNOSIS — I959 Hypotension, unspecified: Secondary | ICD-10-CM | POA: Diagnosis not present

## 2020-11-25 DIAGNOSIS — Z7984 Long term (current) use of oral hypoglycemic drugs: Secondary | ICD-10-CM | POA: Diagnosis not present

## 2020-11-25 DIAGNOSIS — Z4682 Encounter for fitting and adjustment of non-vascular catheter: Secondary | ICD-10-CM | POA: Diagnosis not present

## 2020-11-25 DIAGNOSIS — R5381 Other malaise: Secondary | ICD-10-CM | POA: Diagnosis not present

## 2020-11-25 DIAGNOSIS — I4819 Other persistent atrial fibrillation: Secondary | ICD-10-CM | POA: Diagnosis not present

## 2020-11-25 DIAGNOSIS — E785 Hyperlipidemia, unspecified: Secondary | ICD-10-CM | POA: Diagnosis not present

## 2020-11-25 DIAGNOSIS — E8770 Fluid overload, unspecified: Secondary | ICD-10-CM | POA: Diagnosis not present

## 2020-11-25 DIAGNOSIS — I469 Cardiac arrest, cause unspecified: Secondary | ICD-10-CM | POA: Diagnosis not present

## 2020-11-25 DIAGNOSIS — J939 Pneumothorax, unspecified: Secondary | ICD-10-CM | POA: Diagnosis not present

## 2020-11-25 DIAGNOSIS — I5089 Other heart failure: Secondary | ICD-10-CM | POA: Diagnosis not present

## 2020-11-25 DIAGNOSIS — G8918 Other acute postprocedural pain: Secondary | ICD-10-CM | POA: Diagnosis not present

## 2020-11-25 DIAGNOSIS — I255 Ischemic cardiomyopathy: Secondary | ICD-10-CM | POA: Diagnosis not present

## 2020-11-25 DIAGNOSIS — J95821 Acute postprocedural respiratory failure: Secondary | ICD-10-CM | POA: Diagnosis not present

## 2020-11-25 DIAGNOSIS — D62 Acute posthemorrhagic anemia: Secondary | ICD-10-CM | POA: Diagnosis not present

## 2020-11-25 DIAGNOSIS — I771 Stricture of artery: Secondary | ICD-10-CM | POA: Diagnosis not present

## 2020-11-25 DIAGNOSIS — Z20822 Contact with and (suspected) exposure to covid-19: Secondary | ICD-10-CM | POA: Diagnosis not present

## 2020-11-25 DIAGNOSIS — I441 Atrioventricular block, second degree: Secondary | ICD-10-CM | POA: Diagnosis not present

## 2020-11-25 DIAGNOSIS — E1165 Type 2 diabetes mellitus with hyperglycemia: Secondary | ICD-10-CM | POA: Diagnosis not present

## 2020-11-25 DIAGNOSIS — I2510 Atherosclerotic heart disease of native coronary artery without angina pectoris: Secondary | ICD-10-CM | POA: Diagnosis not present

## 2020-11-25 DIAGNOSIS — I8393 Asymptomatic varicose veins of bilateral lower extremities: Secondary | ICD-10-CM | POA: Diagnosis not present

## 2020-11-25 DIAGNOSIS — I498 Other specified cardiac arrhythmias: Secondary | ICD-10-CM | POA: Diagnosis not present

## 2020-11-25 DIAGNOSIS — G309 Alzheimer's disease, unspecified: Secondary | ICD-10-CM | POA: Diagnosis not present

## 2020-11-25 DIAGNOSIS — Z9889 Other specified postprocedural states: Secondary | ICD-10-CM | POA: Diagnosis not present

## 2020-11-25 DIAGNOSIS — I4891 Unspecified atrial fibrillation: Secondary | ICD-10-CM | POA: Diagnosis not present

## 2020-11-25 DIAGNOSIS — I10 Essential (primary) hypertension: Secondary | ICD-10-CM | POA: Diagnosis not present

## 2020-11-25 DIAGNOSIS — J9811 Atelectasis: Secondary | ICD-10-CM | POA: Diagnosis not present

## 2020-11-25 DIAGNOSIS — R0602 Shortness of breath: Secondary | ICD-10-CM | POA: Diagnosis not present

## 2020-11-25 DIAGNOSIS — Z4659 Encounter for fitting and adjustment of other gastrointestinal appliance and device: Secondary | ICD-10-CM | POA: Diagnosis not present

## 2020-12-23 DIAGNOSIS — I440 Atrioventricular block, first degree: Secondary | ICD-10-CM | POA: Diagnosis not present

## 2020-12-23 DIAGNOSIS — I10 Essential (primary) hypertension: Secondary | ICD-10-CM | POA: Diagnosis not present

## 2020-12-23 DIAGNOSIS — I272 Pulmonary hypertension, unspecified: Secondary | ICD-10-CM | POA: Diagnosis not present

## 2020-12-23 DIAGNOSIS — G8918 Other acute postprocedural pain: Secondary | ICD-10-CM | POA: Diagnosis not present

## 2020-12-23 DIAGNOSIS — Z48812 Encounter for surgical aftercare following surgery on the circulatory system: Secondary | ICD-10-CM | POA: Diagnosis not present

## 2020-12-23 DIAGNOSIS — I2510 Atherosclerotic heart disease of native coronary artery without angina pectoris: Secondary | ICD-10-CM | POA: Diagnosis not present

## 2020-12-23 DIAGNOSIS — I6523 Occlusion and stenosis of bilateral carotid arteries: Secondary | ICD-10-CM | POA: Diagnosis not present

## 2020-12-23 DIAGNOSIS — I083 Combined rheumatic disorders of mitral, aortic and tricuspid valves: Secondary | ICD-10-CM | POA: Diagnosis not present

## 2020-12-23 DIAGNOSIS — I480 Paroxysmal atrial fibrillation: Secondary | ICD-10-CM | POA: Diagnosis not present

## 2020-12-23 DIAGNOSIS — D62 Acute posthemorrhagic anemia: Secondary | ICD-10-CM | POA: Diagnosis not present

## 2020-12-23 DIAGNOSIS — Z951 Presence of aortocoronary bypass graft: Secondary | ICD-10-CM | POA: Diagnosis not present

## 2020-12-23 DIAGNOSIS — I447 Left bundle-branch block, unspecified: Secondary | ICD-10-CM | POA: Diagnosis not present

## 2020-12-23 DIAGNOSIS — I442 Atrioventricular block, complete: Secondary | ICD-10-CM | POA: Diagnosis not present

## 2020-12-24 DIAGNOSIS — G8918 Other acute postprocedural pain: Secondary | ICD-10-CM | POA: Diagnosis not present

## 2020-12-24 DIAGNOSIS — Z951 Presence of aortocoronary bypass graft: Secondary | ICD-10-CM | POA: Diagnosis not present

## 2020-12-24 DIAGNOSIS — I2510 Atherosclerotic heart disease of native coronary artery without angina pectoris: Secondary | ICD-10-CM | POA: Diagnosis not present

## 2020-12-24 DIAGNOSIS — I480 Paroxysmal atrial fibrillation: Secondary | ICD-10-CM | POA: Diagnosis not present

## 2020-12-24 DIAGNOSIS — Z48812 Encounter for surgical aftercare following surgery on the circulatory system: Secondary | ICD-10-CM | POA: Diagnosis not present

## 2020-12-24 DIAGNOSIS — D62 Acute posthemorrhagic anemia: Secondary | ICD-10-CM | POA: Diagnosis not present

## 2020-12-24 DIAGNOSIS — I440 Atrioventricular block, first degree: Secondary | ICD-10-CM | POA: Diagnosis not present

## 2020-12-24 DIAGNOSIS — I447 Left bundle-branch block, unspecified: Secondary | ICD-10-CM | POA: Diagnosis not present

## 2020-12-24 DIAGNOSIS — I442 Atrioventricular block, complete: Secondary | ICD-10-CM | POA: Diagnosis not present

## 2020-12-24 DIAGNOSIS — I10 Essential (primary) hypertension: Secondary | ICD-10-CM | POA: Diagnosis not present

## 2020-12-24 DIAGNOSIS — I6523 Occlusion and stenosis of bilateral carotid arteries: Secondary | ICD-10-CM | POA: Diagnosis not present

## 2020-12-24 DIAGNOSIS — I272 Pulmonary hypertension, unspecified: Secondary | ICD-10-CM | POA: Diagnosis not present

## 2020-12-24 DIAGNOSIS — I083 Combined rheumatic disorders of mitral, aortic and tricuspid valves: Secondary | ICD-10-CM | POA: Diagnosis not present

## 2020-12-26 DIAGNOSIS — E785 Hyperlipidemia, unspecified: Secondary | ICD-10-CM | POA: Diagnosis not present

## 2020-12-26 DIAGNOSIS — I10 Essential (primary) hypertension: Secondary | ICD-10-CM | POA: Diagnosis not present

## 2020-12-26 DIAGNOSIS — E1159 Type 2 diabetes mellitus with other circulatory complications: Secondary | ICD-10-CM | POA: Diagnosis not present

## 2020-12-29 DIAGNOSIS — E538 Deficiency of other specified B group vitamins: Secondary | ICD-10-CM | POA: Diagnosis not present

## 2020-12-29 DIAGNOSIS — Z950 Presence of cardiac pacemaker: Secondary | ICD-10-CM | POA: Diagnosis not present

## 2020-12-29 DIAGNOSIS — B9620 Unspecified Escherichia coli [E. coli] as the cause of diseases classified elsewhere: Secondary | ICD-10-CM | POA: Diagnosis not present

## 2020-12-29 DIAGNOSIS — F4489 Other dissociative and conversion disorders: Secondary | ICD-10-CM | POA: Diagnosis not present

## 2020-12-29 DIAGNOSIS — Z951 Presence of aortocoronary bypass graft: Secondary | ICD-10-CM | POA: Diagnosis not present

## 2020-12-29 DIAGNOSIS — F0391 Unspecified dementia with behavioral disturbance: Secondary | ICD-10-CM | POA: Diagnosis not present

## 2020-12-29 DIAGNOSIS — Z20822 Contact with and (suspected) exposure to covid-19: Secondary | ICD-10-CM | POA: Diagnosis not present

## 2020-12-29 DIAGNOSIS — R29818 Other symptoms and signs involving the nervous system: Secondary | ICD-10-CM | POA: Diagnosis not present

## 2020-12-29 DIAGNOSIS — Z953 Presence of xenogenic heart valve: Secondary | ICD-10-CM | POA: Diagnosis not present

## 2020-12-29 DIAGNOSIS — I110 Hypertensive heart disease with heart failure: Secondary | ICD-10-CM | POA: Diagnosis not present

## 2020-12-29 DIAGNOSIS — E785 Hyperlipidemia, unspecified: Secondary | ICD-10-CM | POA: Diagnosis not present

## 2020-12-29 DIAGNOSIS — R269 Unspecified abnormalities of gait and mobility: Secondary | ICD-10-CM | POA: Diagnosis not present

## 2020-12-29 DIAGNOSIS — E871 Hypo-osmolality and hyponatremia: Secondary | ICD-10-CM | POA: Diagnosis not present

## 2020-12-29 DIAGNOSIS — R531 Weakness: Secondary | ICD-10-CM | POA: Diagnosis not present

## 2020-12-29 DIAGNOSIS — I255 Ischemic cardiomyopathy: Secondary | ICD-10-CM | POA: Diagnosis not present

## 2020-12-29 DIAGNOSIS — I5043 Acute on chronic combined systolic (congestive) and diastolic (congestive) heart failure: Secondary | ICD-10-CM | POA: Diagnosis not present

## 2020-12-29 DIAGNOSIS — R410 Disorientation, unspecified: Secondary | ICD-10-CM | POA: Diagnosis not present

## 2020-12-29 DIAGNOSIS — J90 Pleural effusion, not elsewhere classified: Secondary | ICD-10-CM | POA: Diagnosis not present

## 2020-12-29 DIAGNOSIS — G4700 Insomnia, unspecified: Secondary | ICD-10-CM | POA: Diagnosis not present

## 2020-12-29 DIAGNOSIS — R918 Other nonspecific abnormal finding of lung field: Secondary | ICD-10-CM | POA: Diagnosis not present

## 2020-12-29 DIAGNOSIS — Z7984 Long term (current) use of oral hypoglycemic drugs: Secondary | ICD-10-CM | POA: Diagnosis not present

## 2020-12-29 DIAGNOSIS — F32A Depression, unspecified: Secondary | ICD-10-CM | POA: Diagnosis not present

## 2020-12-29 DIAGNOSIS — G300 Alzheimer's disease with early onset: Secondary | ICD-10-CM | POA: Diagnosis not present

## 2020-12-29 DIAGNOSIS — G250 Essential tremor: Secondary | ICD-10-CM | POA: Diagnosis not present

## 2020-12-29 DIAGNOSIS — I484 Atypical atrial flutter: Secondary | ICD-10-CM | POA: Diagnosis not present

## 2020-12-29 DIAGNOSIS — F0280 Dementia in other diseases classified elsewhere without behavioral disturbance: Secondary | ICD-10-CM | POA: Diagnosis not present

## 2020-12-29 DIAGNOSIS — E119 Type 2 diabetes mellitus without complications: Secondary | ICD-10-CM | POA: Diagnosis not present

## 2020-12-29 DIAGNOSIS — E1165 Type 2 diabetes mellitus with hyperglycemia: Secondary | ICD-10-CM | POA: Diagnosis not present

## 2020-12-29 DIAGNOSIS — I4892 Unspecified atrial flutter: Secondary | ICD-10-CM | POA: Diagnosis not present

## 2020-12-29 DIAGNOSIS — Z87891 Personal history of nicotine dependence: Secondary | ICD-10-CM | POA: Diagnosis not present

## 2020-12-29 DIAGNOSIS — R638 Other symptoms and signs concerning food and fluid intake: Secondary | ICD-10-CM | POA: Diagnosis not present

## 2020-12-29 DIAGNOSIS — R4182 Altered mental status, unspecified: Secondary | ICD-10-CM | POA: Diagnosis not present

## 2020-12-29 DIAGNOSIS — I2510 Atherosclerotic heart disease of native coronary artery without angina pectoris: Secondary | ICD-10-CM | POA: Diagnosis not present

## 2020-12-29 DIAGNOSIS — Z45018 Encounter for adjustment and management of other part of cardiac pacemaker: Secondary | ICD-10-CM | POA: Diagnosis not present

## 2020-12-29 DIAGNOSIS — E1151 Type 2 diabetes mellitus with diabetic peripheral angiopathy without gangrene: Secondary | ICD-10-CM | POA: Diagnosis not present

## 2020-12-29 DIAGNOSIS — I808 Phlebitis and thrombophlebitis of other sites: Secondary | ICD-10-CM | POA: Diagnosis not present

## 2020-12-29 DIAGNOSIS — I5042 Chronic combined systolic (congestive) and diastolic (congestive) heart failure: Secondary | ICD-10-CM | POA: Diagnosis not present

## 2020-12-29 DIAGNOSIS — I4891 Unspecified atrial fibrillation: Secondary | ICD-10-CM | POA: Diagnosis not present

## 2020-12-29 DIAGNOSIS — R630 Anorexia: Secondary | ICD-10-CM | POA: Diagnosis not present

## 2020-12-29 DIAGNOSIS — I340 Nonrheumatic mitral (valve) insufficiency: Secondary | ICD-10-CM | POA: Diagnosis not present

## 2020-12-29 DIAGNOSIS — J9811 Atelectasis: Secondary | ICD-10-CM | POA: Diagnosis not present

## 2020-12-29 DIAGNOSIS — F05 Delirium due to known physiological condition: Secondary | ICD-10-CM | POA: Diagnosis not present

## 2020-12-29 DIAGNOSIS — R54 Age-related physical debility: Secondary | ICD-10-CM | POA: Diagnosis not present

## 2020-12-29 DIAGNOSIS — J432 Centrilobular emphysema: Secondary | ICD-10-CM | POA: Diagnosis not present

## 2020-12-29 DIAGNOSIS — G308 Other Alzheimer's disease: Secondary | ICD-10-CM | POA: Diagnosis not present

## 2020-12-29 DIAGNOSIS — Z7982 Long term (current) use of aspirin: Secondary | ICD-10-CM | POA: Diagnosis not present

## 2020-12-29 DIAGNOSIS — Z7901 Long term (current) use of anticoagulants: Secondary | ICD-10-CM | POA: Diagnosis not present

## 2020-12-29 DIAGNOSIS — I447 Left bundle-branch block, unspecified: Secondary | ICD-10-CM | POA: Diagnosis not present

## 2020-12-29 DIAGNOSIS — I10 Essential (primary) hypertension: Secondary | ICD-10-CM | POA: Diagnosis not present

## 2020-12-29 DIAGNOSIS — G301 Alzheimer's disease with late onset: Secondary | ICD-10-CM | POA: Diagnosis not present

## 2020-12-29 DIAGNOSIS — I4819 Other persistent atrial fibrillation: Secondary | ICD-10-CM | POA: Diagnosis not present

## 2020-12-29 DIAGNOSIS — Z9889 Other specified postprocedural states: Secondary | ICD-10-CM | POA: Diagnosis not present

## 2020-12-29 DIAGNOSIS — E222 Syndrome of inappropriate secretion of antidiuretic hormone: Secondary | ICD-10-CM | POA: Diagnosis not present

## 2020-12-29 DIAGNOSIS — R4189 Other symptoms and signs involving cognitive functions and awareness: Secondary | ICD-10-CM | POA: Diagnosis not present

## 2020-12-29 DIAGNOSIS — Z79899 Other long term (current) drug therapy: Secondary | ICD-10-CM | POA: Diagnosis not present

## 2020-12-29 DIAGNOSIS — I4811 Longstanding persistent atrial fibrillation: Secondary | ICD-10-CM | POA: Diagnosis not present

## 2020-12-29 DIAGNOSIS — R404 Transient alteration of awareness: Secondary | ICD-10-CM | POA: Diagnosis not present

## 2020-12-29 DIAGNOSIS — E559 Vitamin D deficiency, unspecified: Secondary | ICD-10-CM | POA: Diagnosis not present

## 2020-12-29 DIAGNOSIS — I472 Ventricular tachycardia: Secondary | ICD-10-CM | POA: Diagnosis not present

## 2020-12-29 DIAGNOSIS — I871 Compression of vein: Secondary | ICD-10-CM | POA: Diagnosis not present

## 2020-12-29 DIAGNOSIS — Z885 Allergy status to narcotic agent status: Secondary | ICD-10-CM | POA: Diagnosis not present

## 2020-12-29 DIAGNOSIS — Z952 Presence of prosthetic heart valve: Secondary | ICD-10-CM | POA: Diagnosis not present

## 2020-12-29 DIAGNOSIS — N390 Urinary tract infection, site not specified: Secondary | ICD-10-CM | POA: Diagnosis not present

## 2020-12-30 DIAGNOSIS — Z950 Presence of cardiac pacemaker: Secondary | ICD-10-CM | POA: Diagnosis not present

## 2020-12-30 DIAGNOSIS — I484 Atypical atrial flutter: Secondary | ICD-10-CM | POA: Diagnosis not present

## 2020-12-30 DIAGNOSIS — Z952 Presence of prosthetic heart valve: Secondary | ICD-10-CM | POA: Diagnosis not present

## 2020-12-30 DIAGNOSIS — Z7901 Long term (current) use of anticoagulants: Secondary | ICD-10-CM | POA: Diagnosis not present

## 2020-12-30 DIAGNOSIS — I4811 Longstanding persistent atrial fibrillation: Secondary | ICD-10-CM | POA: Diagnosis not present

## 2020-12-30 DIAGNOSIS — I5043 Acute on chronic combined systolic (congestive) and diastolic (congestive) heart failure: Secondary | ICD-10-CM | POA: Diagnosis not present

## 2020-12-30 DIAGNOSIS — I4819 Other persistent atrial fibrillation: Secondary | ICD-10-CM | POA: Diagnosis not present

## 2020-12-30 DIAGNOSIS — F4489 Other dissociative and conversion disorders: Secondary | ICD-10-CM | POA: Diagnosis not present

## 2020-12-31 DIAGNOSIS — I4811 Longstanding persistent atrial fibrillation: Secondary | ICD-10-CM | POA: Diagnosis not present

## 2020-12-31 DIAGNOSIS — F4489 Other dissociative and conversion disorders: Secondary | ICD-10-CM | POA: Diagnosis not present

## 2020-12-31 DIAGNOSIS — I5043 Acute on chronic combined systolic (congestive) and diastolic (congestive) heart failure: Secondary | ICD-10-CM | POA: Diagnosis not present

## 2020-12-31 DIAGNOSIS — Z950 Presence of cardiac pacemaker: Secondary | ICD-10-CM | POA: Diagnosis not present

## 2020-12-31 DIAGNOSIS — Z952 Presence of prosthetic heart valve: Secondary | ICD-10-CM | POA: Diagnosis not present

## 2021-01-01 DIAGNOSIS — R4189 Other symptoms and signs involving cognitive functions and awareness: Secondary | ICD-10-CM | POA: Diagnosis not present

## 2021-01-01 DIAGNOSIS — Z79899 Other long term (current) drug therapy: Secondary | ICD-10-CM | POA: Diagnosis not present

## 2021-01-01 DIAGNOSIS — Z952 Presence of prosthetic heart valve: Secondary | ICD-10-CM | POA: Diagnosis not present

## 2021-01-01 DIAGNOSIS — E871 Hypo-osmolality and hyponatremia: Secondary | ICD-10-CM | POA: Diagnosis not present

## 2021-01-01 DIAGNOSIS — R29818 Other symptoms and signs involving the nervous system: Secondary | ICD-10-CM | POA: Diagnosis not present

## 2021-01-01 DIAGNOSIS — I5043 Acute on chronic combined systolic (congestive) and diastolic (congestive) heart failure: Secondary | ICD-10-CM | POA: Diagnosis not present

## 2021-01-01 DIAGNOSIS — F4489 Other dissociative and conversion disorders: Secondary | ICD-10-CM | POA: Diagnosis not present

## 2021-01-01 DIAGNOSIS — F05 Delirium due to known physiological condition: Secondary | ICD-10-CM | POA: Diagnosis not present

## 2021-01-01 DIAGNOSIS — R638 Other symptoms and signs concerning food and fluid intake: Secondary | ICD-10-CM | POA: Diagnosis not present

## 2021-01-01 DIAGNOSIS — R54 Age-related physical debility: Secondary | ICD-10-CM | POA: Diagnosis not present

## 2021-01-01 DIAGNOSIS — Z950 Presence of cardiac pacemaker: Secondary | ICD-10-CM | POA: Diagnosis not present

## 2021-01-02 DIAGNOSIS — I5043 Acute on chronic combined systolic (congestive) and diastolic (congestive) heart failure: Secondary | ICD-10-CM | POA: Diagnosis not present

## 2021-01-02 DIAGNOSIS — Z79899 Other long term (current) drug therapy: Secondary | ICD-10-CM | POA: Diagnosis not present

## 2021-01-02 DIAGNOSIS — R29818 Other symptoms and signs involving the nervous system: Secondary | ICD-10-CM | POA: Diagnosis not present

## 2021-01-02 DIAGNOSIS — Z950 Presence of cardiac pacemaker: Secondary | ICD-10-CM | POA: Diagnosis not present

## 2021-01-02 DIAGNOSIS — R54 Age-related physical debility: Secondary | ICD-10-CM | POA: Diagnosis not present

## 2021-01-02 DIAGNOSIS — Z951 Presence of aortocoronary bypass graft: Secondary | ICD-10-CM | POA: Diagnosis not present

## 2021-01-02 DIAGNOSIS — I4811 Longstanding persistent atrial fibrillation: Secondary | ICD-10-CM | POA: Diagnosis not present

## 2021-01-02 DIAGNOSIS — I255 Ischemic cardiomyopathy: Secondary | ICD-10-CM | POA: Diagnosis not present

## 2021-01-02 DIAGNOSIS — Z952 Presence of prosthetic heart valve: Secondary | ICD-10-CM | POA: Diagnosis not present

## 2021-01-02 DIAGNOSIS — J90 Pleural effusion, not elsewhere classified: Secondary | ICD-10-CM | POA: Diagnosis not present

## 2021-01-02 DIAGNOSIS — R638 Other symptoms and signs concerning food and fluid intake: Secondary | ICD-10-CM | POA: Diagnosis not present

## 2021-01-02 DIAGNOSIS — I447 Left bundle-branch block, unspecified: Secondary | ICD-10-CM | POA: Diagnosis not present

## 2021-01-02 DIAGNOSIS — E871 Hypo-osmolality and hyponatremia: Secondary | ICD-10-CM | POA: Diagnosis not present

## 2021-01-03 DIAGNOSIS — R410 Disorientation, unspecified: Secondary | ICD-10-CM | POA: Diagnosis not present

## 2021-01-03 DIAGNOSIS — I447 Left bundle-branch block, unspecified: Secondary | ICD-10-CM | POA: Diagnosis not present

## 2021-01-03 DIAGNOSIS — Z952 Presence of prosthetic heart valve: Secondary | ICD-10-CM | POA: Diagnosis not present

## 2021-01-03 DIAGNOSIS — R29818 Other symptoms and signs involving the nervous system: Secondary | ICD-10-CM | POA: Diagnosis not present

## 2021-01-03 DIAGNOSIS — Z79899 Other long term (current) drug therapy: Secondary | ICD-10-CM | POA: Diagnosis not present

## 2021-01-03 DIAGNOSIS — I255 Ischemic cardiomyopathy: Secondary | ICD-10-CM | POA: Diagnosis not present

## 2021-01-03 DIAGNOSIS — I5043 Acute on chronic combined systolic (congestive) and diastolic (congestive) heart failure: Secondary | ICD-10-CM | POA: Diagnosis not present

## 2021-01-03 DIAGNOSIS — R54 Age-related physical debility: Secondary | ICD-10-CM | POA: Diagnosis not present

## 2021-01-03 DIAGNOSIS — R638 Other symptoms and signs concerning food and fluid intake: Secondary | ICD-10-CM | POA: Diagnosis not present

## 2021-01-03 DIAGNOSIS — Z950 Presence of cardiac pacemaker: Secondary | ICD-10-CM | POA: Diagnosis not present

## 2021-01-03 DIAGNOSIS — E871 Hypo-osmolality and hyponatremia: Secondary | ICD-10-CM | POA: Diagnosis not present

## 2021-01-03 DIAGNOSIS — I4811 Longstanding persistent atrial fibrillation: Secondary | ICD-10-CM | POA: Diagnosis not present

## 2021-01-03 DIAGNOSIS — Z951 Presence of aortocoronary bypass graft: Secondary | ICD-10-CM | POA: Diagnosis not present

## 2021-01-04 DIAGNOSIS — I5043 Acute on chronic combined systolic (congestive) and diastolic (congestive) heart failure: Secondary | ICD-10-CM | POA: Diagnosis not present

## 2021-01-04 DIAGNOSIS — R54 Age-related physical debility: Secondary | ICD-10-CM | POA: Diagnosis not present

## 2021-01-04 DIAGNOSIS — Z9889 Other specified postprocedural states: Secondary | ICD-10-CM | POA: Diagnosis not present

## 2021-01-04 DIAGNOSIS — I4891 Unspecified atrial fibrillation: Secondary | ICD-10-CM | POA: Diagnosis not present

## 2021-01-04 DIAGNOSIS — Z79899 Other long term (current) drug therapy: Secondary | ICD-10-CM | POA: Diagnosis not present

## 2021-01-04 DIAGNOSIS — Z950 Presence of cardiac pacemaker: Secondary | ICD-10-CM | POA: Diagnosis not present

## 2021-01-04 DIAGNOSIS — I447 Left bundle-branch block, unspecified: Secondary | ICD-10-CM | POA: Diagnosis not present

## 2021-01-04 DIAGNOSIS — Z951 Presence of aortocoronary bypass graft: Secondary | ICD-10-CM | POA: Diagnosis not present

## 2021-01-04 DIAGNOSIS — I4811 Longstanding persistent atrial fibrillation: Secondary | ICD-10-CM | POA: Diagnosis not present

## 2021-01-04 DIAGNOSIS — Z45018 Encounter for adjustment and management of other part of cardiac pacemaker: Secondary | ICD-10-CM | POA: Diagnosis not present

## 2021-01-04 DIAGNOSIS — J9811 Atelectasis: Secondary | ICD-10-CM | POA: Diagnosis not present

## 2021-01-04 DIAGNOSIS — Z952 Presence of prosthetic heart valve: Secondary | ICD-10-CM | POA: Diagnosis not present

## 2021-01-04 DIAGNOSIS — E871 Hypo-osmolality and hyponatremia: Secondary | ICD-10-CM | POA: Diagnosis not present

## 2021-01-04 DIAGNOSIS — I255 Ischemic cardiomyopathy: Secondary | ICD-10-CM | POA: Diagnosis not present

## 2021-01-04 DIAGNOSIS — R29818 Other symptoms and signs involving the nervous system: Secondary | ICD-10-CM | POA: Diagnosis not present

## 2021-01-04 DIAGNOSIS — J90 Pleural effusion, not elsewhere classified: Secondary | ICD-10-CM | POA: Diagnosis not present

## 2021-01-04 DIAGNOSIS — R638 Other symptoms and signs concerning food and fluid intake: Secondary | ICD-10-CM | POA: Diagnosis not present

## 2021-01-05 DIAGNOSIS — J9811 Atelectasis: Secondary | ICD-10-CM | POA: Diagnosis not present

## 2021-01-05 DIAGNOSIS — Z950 Presence of cardiac pacemaker: Secondary | ICD-10-CM | POA: Diagnosis not present

## 2021-01-05 DIAGNOSIS — J90 Pleural effusion, not elsewhere classified: Secondary | ICD-10-CM | POA: Diagnosis not present

## 2021-01-05 DIAGNOSIS — Z951 Presence of aortocoronary bypass graft: Secondary | ICD-10-CM | POA: Diagnosis not present

## 2021-01-05 DIAGNOSIS — I5043 Acute on chronic combined systolic (congestive) and diastolic (congestive) heart failure: Secondary | ICD-10-CM | POA: Diagnosis not present

## 2021-01-05 DIAGNOSIS — Z952 Presence of prosthetic heart valve: Secondary | ICD-10-CM | POA: Diagnosis not present

## 2021-01-05 DIAGNOSIS — E871 Hypo-osmolality and hyponatremia: Secondary | ICD-10-CM | POA: Diagnosis not present

## 2021-01-05 DIAGNOSIS — R638 Other symptoms and signs concerning food and fluid intake: Secondary | ICD-10-CM | POA: Diagnosis not present

## 2021-01-05 DIAGNOSIS — R54 Age-related physical debility: Secondary | ICD-10-CM | POA: Diagnosis not present

## 2021-01-05 DIAGNOSIS — I4811 Longstanding persistent atrial fibrillation: Secondary | ICD-10-CM | POA: Diagnosis not present

## 2021-01-05 DIAGNOSIS — Z79899 Other long term (current) drug therapy: Secondary | ICD-10-CM | POA: Diagnosis not present

## 2021-01-05 DIAGNOSIS — I255 Ischemic cardiomyopathy: Secondary | ICD-10-CM | POA: Diagnosis not present

## 2021-01-05 DIAGNOSIS — I447 Left bundle-branch block, unspecified: Secondary | ICD-10-CM | POA: Diagnosis not present

## 2021-01-05 DIAGNOSIS — R29818 Other symptoms and signs involving the nervous system: Secondary | ICD-10-CM | POA: Diagnosis not present

## 2021-01-06 DIAGNOSIS — R54 Age-related physical debility: Secondary | ICD-10-CM | POA: Diagnosis not present

## 2021-01-06 DIAGNOSIS — Z79899 Other long term (current) drug therapy: Secondary | ICD-10-CM | POA: Diagnosis not present

## 2021-01-06 DIAGNOSIS — Z951 Presence of aortocoronary bypass graft: Secondary | ICD-10-CM | POA: Diagnosis not present

## 2021-01-06 DIAGNOSIS — R638 Other symptoms and signs concerning food and fluid intake: Secondary | ICD-10-CM | POA: Diagnosis not present

## 2021-01-06 DIAGNOSIS — E871 Hypo-osmolality and hyponatremia: Secondary | ICD-10-CM | POA: Diagnosis not present

## 2021-01-06 DIAGNOSIS — R29818 Other symptoms and signs involving the nervous system: Secondary | ICD-10-CM | POA: Diagnosis not present

## 2021-01-06 DIAGNOSIS — I4811 Longstanding persistent atrial fibrillation: Secondary | ICD-10-CM | POA: Diagnosis not present

## 2021-01-06 DIAGNOSIS — Z950 Presence of cardiac pacemaker: Secondary | ICD-10-CM | POA: Diagnosis not present

## 2021-01-06 DIAGNOSIS — I5043 Acute on chronic combined systolic (congestive) and diastolic (congestive) heart failure: Secondary | ICD-10-CM | POA: Diagnosis not present

## 2021-01-06 DIAGNOSIS — Z952 Presence of prosthetic heart valve: Secondary | ICD-10-CM | POA: Diagnosis not present

## 2021-01-06 DIAGNOSIS — R630 Anorexia: Secondary | ICD-10-CM | POA: Diagnosis not present

## 2021-01-06 DIAGNOSIS — R410 Disorientation, unspecified: Secondary | ICD-10-CM | POA: Diagnosis not present

## 2021-01-06 DIAGNOSIS — I447 Left bundle-branch block, unspecified: Secondary | ICD-10-CM | POA: Diagnosis not present

## 2021-01-06 DIAGNOSIS — I255 Ischemic cardiomyopathy: Secondary | ICD-10-CM | POA: Diagnosis not present

## 2021-01-07 DIAGNOSIS — Z952 Presence of prosthetic heart valve: Secondary | ICD-10-CM | POA: Diagnosis not present

## 2021-01-07 DIAGNOSIS — I447 Left bundle-branch block, unspecified: Secondary | ICD-10-CM | POA: Diagnosis not present

## 2021-01-07 DIAGNOSIS — Z950 Presence of cardiac pacemaker: Secondary | ICD-10-CM | POA: Diagnosis not present

## 2021-01-07 DIAGNOSIS — E871 Hypo-osmolality and hyponatremia: Secondary | ICD-10-CM | POA: Diagnosis not present

## 2021-01-07 DIAGNOSIS — I255 Ischemic cardiomyopathy: Secondary | ICD-10-CM | POA: Diagnosis not present

## 2021-01-07 DIAGNOSIS — Z79899 Other long term (current) drug therapy: Secondary | ICD-10-CM | POA: Diagnosis not present

## 2021-01-07 DIAGNOSIS — I4811 Longstanding persistent atrial fibrillation: Secondary | ICD-10-CM | POA: Diagnosis not present

## 2021-01-07 DIAGNOSIS — I5043 Acute on chronic combined systolic (congestive) and diastolic (congestive) heart failure: Secondary | ICD-10-CM | POA: Diagnosis not present

## 2021-01-07 DIAGNOSIS — R638 Other symptoms and signs concerning food and fluid intake: Secondary | ICD-10-CM | POA: Diagnosis not present

## 2021-01-07 DIAGNOSIS — R29818 Other symptoms and signs involving the nervous system: Secondary | ICD-10-CM | POA: Diagnosis not present

## 2021-01-07 DIAGNOSIS — R54 Age-related physical debility: Secondary | ICD-10-CM | POA: Diagnosis not present

## 2021-01-07 DIAGNOSIS — Z951 Presence of aortocoronary bypass graft: Secondary | ICD-10-CM | POA: Diagnosis not present

## 2021-01-08 DIAGNOSIS — E871 Hypo-osmolality and hyponatremia: Secondary | ICD-10-CM | POA: Diagnosis not present

## 2021-01-08 DIAGNOSIS — Z951 Presence of aortocoronary bypass graft: Secondary | ICD-10-CM | POA: Diagnosis not present

## 2021-01-08 DIAGNOSIS — R54 Age-related physical debility: Secondary | ICD-10-CM | POA: Diagnosis not present

## 2021-01-08 DIAGNOSIS — I447 Left bundle-branch block, unspecified: Secondary | ICD-10-CM | POA: Diagnosis not present

## 2021-01-08 DIAGNOSIS — I5043 Acute on chronic combined systolic (congestive) and diastolic (congestive) heart failure: Secondary | ICD-10-CM | POA: Diagnosis not present

## 2021-01-08 DIAGNOSIS — I255 Ischemic cardiomyopathy: Secondary | ICD-10-CM | POA: Diagnosis not present

## 2021-01-08 DIAGNOSIS — Z952 Presence of prosthetic heart valve: Secondary | ICD-10-CM | POA: Diagnosis not present

## 2021-01-08 DIAGNOSIS — Z79899 Other long term (current) drug therapy: Secondary | ICD-10-CM | POA: Diagnosis not present

## 2021-01-08 DIAGNOSIS — I4811 Longstanding persistent atrial fibrillation: Secondary | ICD-10-CM | POA: Diagnosis not present

## 2021-01-08 DIAGNOSIS — R638 Other symptoms and signs concerning food and fluid intake: Secondary | ICD-10-CM | POA: Diagnosis not present

## 2021-01-08 DIAGNOSIS — Z950 Presence of cardiac pacemaker: Secondary | ICD-10-CM | POA: Diagnosis not present

## 2021-01-08 DIAGNOSIS — R29818 Other symptoms and signs involving the nervous system: Secondary | ICD-10-CM | POA: Diagnosis not present

## 2021-01-09 DIAGNOSIS — Z79899 Other long term (current) drug therapy: Secondary | ICD-10-CM | POA: Diagnosis not present

## 2021-01-09 DIAGNOSIS — R54 Age-related physical debility: Secondary | ICD-10-CM | POA: Diagnosis not present

## 2021-01-09 DIAGNOSIS — Z950 Presence of cardiac pacemaker: Secondary | ICD-10-CM | POA: Diagnosis not present

## 2021-01-09 DIAGNOSIS — I5043 Acute on chronic combined systolic (congestive) and diastolic (congestive) heart failure: Secondary | ICD-10-CM | POA: Diagnosis not present

## 2021-01-09 DIAGNOSIS — E871 Hypo-osmolality and hyponatremia: Secondary | ICD-10-CM | POA: Diagnosis not present

## 2021-01-09 DIAGNOSIS — R4182 Altered mental status, unspecified: Secondary | ICD-10-CM | POA: Diagnosis not present

## 2021-01-09 DIAGNOSIS — N390 Urinary tract infection, site not specified: Secondary | ICD-10-CM | POA: Diagnosis not present

## 2021-01-09 DIAGNOSIS — I4891 Unspecified atrial fibrillation: Secondary | ICD-10-CM | POA: Diagnosis not present

## 2021-01-09 DIAGNOSIS — I4811 Longstanding persistent atrial fibrillation: Secondary | ICD-10-CM | POA: Diagnosis not present

## 2021-01-09 DIAGNOSIS — F4489 Other dissociative and conversion disorders: Secondary | ICD-10-CM | POA: Diagnosis not present

## 2021-01-09 DIAGNOSIS — F05 Delirium due to known physiological condition: Secondary | ICD-10-CM | POA: Diagnosis not present

## 2021-01-09 DIAGNOSIS — E119 Type 2 diabetes mellitus without complications: Secondary | ICD-10-CM | POA: Diagnosis not present

## 2021-01-10 DIAGNOSIS — R4182 Altered mental status, unspecified: Secondary | ICD-10-CM | POA: Diagnosis not present

## 2021-01-10 DIAGNOSIS — F4489 Other dissociative and conversion disorders: Secondary | ICD-10-CM | POA: Diagnosis not present

## 2021-01-10 DIAGNOSIS — I5043 Acute on chronic combined systolic (congestive) and diastolic (congestive) heart failure: Secondary | ICD-10-CM | POA: Diagnosis not present

## 2021-01-10 DIAGNOSIS — I4891 Unspecified atrial fibrillation: Secondary | ICD-10-CM | POA: Diagnosis not present

## 2021-01-10 DIAGNOSIS — Z950 Presence of cardiac pacemaker: Secondary | ICD-10-CM | POA: Diagnosis not present

## 2021-01-10 DIAGNOSIS — I4811 Longstanding persistent atrial fibrillation: Secondary | ICD-10-CM | POA: Diagnosis not present

## 2021-01-10 DIAGNOSIS — N390 Urinary tract infection, site not specified: Secondary | ICD-10-CM | POA: Diagnosis not present

## 2021-01-10 DIAGNOSIS — E871 Hypo-osmolality and hyponatremia: Secondary | ICD-10-CM | POA: Diagnosis not present

## 2021-01-10 DIAGNOSIS — R54 Age-related physical debility: Secondary | ICD-10-CM | POA: Diagnosis not present

## 2021-01-10 DIAGNOSIS — E119 Type 2 diabetes mellitus without complications: Secondary | ICD-10-CM | POA: Diagnosis not present

## 2021-01-10 DIAGNOSIS — F05 Delirium due to known physiological condition: Secondary | ICD-10-CM | POA: Diagnosis not present

## 2021-01-10 DIAGNOSIS — Z79899 Other long term (current) drug therapy: Secondary | ICD-10-CM | POA: Diagnosis not present

## 2021-01-11 DIAGNOSIS — N390 Urinary tract infection, site not specified: Secondary | ICD-10-CM | POA: Diagnosis not present

## 2021-01-11 DIAGNOSIS — Z79899 Other long term (current) drug therapy: Secondary | ICD-10-CM | POA: Diagnosis not present

## 2021-01-11 DIAGNOSIS — Z950 Presence of cardiac pacemaker: Secondary | ICD-10-CM | POA: Diagnosis not present

## 2021-01-11 DIAGNOSIS — R54 Age-related physical debility: Secondary | ICD-10-CM | POA: Diagnosis not present

## 2021-01-11 DIAGNOSIS — E871 Hypo-osmolality and hyponatremia: Secondary | ICD-10-CM | POA: Diagnosis not present

## 2021-01-11 DIAGNOSIS — R4182 Altered mental status, unspecified: Secondary | ICD-10-CM | POA: Diagnosis not present

## 2021-01-11 DIAGNOSIS — I5043 Acute on chronic combined systolic (congestive) and diastolic (congestive) heart failure: Secondary | ICD-10-CM | POA: Diagnosis not present

## 2021-01-11 DIAGNOSIS — F05 Delirium due to known physiological condition: Secondary | ICD-10-CM | POA: Diagnosis not present

## 2021-01-11 DIAGNOSIS — E119 Type 2 diabetes mellitus without complications: Secondary | ICD-10-CM | POA: Diagnosis not present

## 2021-01-11 DIAGNOSIS — I4811 Longstanding persistent atrial fibrillation: Secondary | ICD-10-CM | POA: Diagnosis not present

## 2021-01-11 DIAGNOSIS — I4891 Unspecified atrial fibrillation: Secondary | ICD-10-CM | POA: Diagnosis not present

## 2021-01-11 DIAGNOSIS — F4489 Other dissociative and conversion disorders: Secondary | ICD-10-CM | POA: Diagnosis not present

## 2021-01-12 DIAGNOSIS — I4891 Unspecified atrial fibrillation: Secondary | ICD-10-CM | POA: Diagnosis not present

## 2021-01-12 DIAGNOSIS — I4811 Longstanding persistent atrial fibrillation: Secondary | ICD-10-CM | POA: Diagnosis not present

## 2021-01-12 DIAGNOSIS — I5043 Acute on chronic combined systolic (congestive) and diastolic (congestive) heart failure: Secondary | ICD-10-CM | POA: Diagnosis not present

## 2021-01-12 DIAGNOSIS — E119 Type 2 diabetes mellitus without complications: Secondary | ICD-10-CM | POA: Diagnosis not present

## 2021-01-12 DIAGNOSIS — N390 Urinary tract infection, site not specified: Secondary | ICD-10-CM | POA: Diagnosis not present

## 2021-01-12 DIAGNOSIS — E871 Hypo-osmolality and hyponatremia: Secondary | ICD-10-CM | POA: Diagnosis not present

## 2021-01-12 DIAGNOSIS — R4182 Altered mental status, unspecified: Secondary | ICD-10-CM | POA: Diagnosis not present

## 2021-01-12 DIAGNOSIS — Z79899 Other long term (current) drug therapy: Secondary | ICD-10-CM | POA: Diagnosis not present

## 2021-01-12 DIAGNOSIS — Z950 Presence of cardiac pacemaker: Secondary | ICD-10-CM | POA: Diagnosis not present

## 2021-01-12 DIAGNOSIS — F4489 Other dissociative and conversion disorders: Secondary | ICD-10-CM | POA: Diagnosis not present

## 2021-01-12 DIAGNOSIS — R54 Age-related physical debility: Secondary | ICD-10-CM | POA: Diagnosis not present

## 2021-01-12 DIAGNOSIS — F05 Delirium due to known physiological condition: Secondary | ICD-10-CM | POA: Diagnosis not present

## 2021-01-13 DIAGNOSIS — N390 Urinary tract infection, site not specified: Secondary | ICD-10-CM | POA: Diagnosis not present

## 2021-01-13 DIAGNOSIS — R4182 Altered mental status, unspecified: Secondary | ICD-10-CM | POA: Diagnosis not present

## 2021-01-13 DIAGNOSIS — F05 Delirium due to known physiological condition: Secondary | ICD-10-CM | POA: Diagnosis not present

## 2021-01-13 DIAGNOSIS — Z950 Presence of cardiac pacemaker: Secondary | ICD-10-CM | POA: Diagnosis not present

## 2021-01-13 DIAGNOSIS — I4811 Longstanding persistent atrial fibrillation: Secondary | ICD-10-CM | POA: Diagnosis not present

## 2021-01-13 DIAGNOSIS — I5043 Acute on chronic combined systolic (congestive) and diastolic (congestive) heart failure: Secondary | ICD-10-CM | POA: Diagnosis not present

## 2021-01-13 DIAGNOSIS — E871 Hypo-osmolality and hyponatremia: Secondary | ICD-10-CM | POA: Diagnosis not present

## 2021-01-13 DIAGNOSIS — R54 Age-related physical debility: Secondary | ICD-10-CM | POA: Diagnosis not present

## 2021-01-13 DIAGNOSIS — E119 Type 2 diabetes mellitus without complications: Secondary | ICD-10-CM | POA: Diagnosis not present

## 2021-01-13 DIAGNOSIS — Z79899 Other long term (current) drug therapy: Secondary | ICD-10-CM | POA: Diagnosis not present

## 2021-01-13 DIAGNOSIS — I4891 Unspecified atrial fibrillation: Secondary | ICD-10-CM | POA: Diagnosis not present

## 2021-01-13 DIAGNOSIS — F4489 Other dissociative and conversion disorders: Secondary | ICD-10-CM | POA: Diagnosis not present

## 2021-01-14 DIAGNOSIS — N390 Urinary tract infection, site not specified: Secondary | ICD-10-CM | POA: Diagnosis not present

## 2021-01-14 DIAGNOSIS — Z79899 Other long term (current) drug therapy: Secondary | ICD-10-CM | POA: Diagnosis not present

## 2021-01-14 DIAGNOSIS — F05 Delirium due to known physiological condition: Secondary | ICD-10-CM | POA: Diagnosis not present

## 2021-01-14 DIAGNOSIS — I4891 Unspecified atrial fibrillation: Secondary | ICD-10-CM | POA: Diagnosis not present

## 2021-01-14 DIAGNOSIS — R54 Age-related physical debility: Secondary | ICD-10-CM | POA: Diagnosis not present

## 2021-01-14 DIAGNOSIS — I4811 Longstanding persistent atrial fibrillation: Secondary | ICD-10-CM | POA: Diagnosis not present

## 2021-01-14 DIAGNOSIS — Z950 Presence of cardiac pacemaker: Secondary | ICD-10-CM | POA: Diagnosis not present

## 2021-01-14 DIAGNOSIS — E119 Type 2 diabetes mellitus without complications: Secondary | ICD-10-CM | POA: Diagnosis not present

## 2021-01-14 DIAGNOSIS — E871 Hypo-osmolality and hyponatremia: Secondary | ICD-10-CM | POA: Diagnosis not present

## 2021-01-14 DIAGNOSIS — F4489 Other dissociative and conversion disorders: Secondary | ICD-10-CM | POA: Diagnosis not present

## 2021-01-14 DIAGNOSIS — R4182 Altered mental status, unspecified: Secondary | ICD-10-CM | POA: Diagnosis not present

## 2021-01-14 DIAGNOSIS — I5043 Acute on chronic combined systolic (congestive) and diastolic (congestive) heart failure: Secondary | ICD-10-CM | POA: Diagnosis not present

## 2021-01-15 DIAGNOSIS — F4489 Other dissociative and conversion disorders: Secondary | ICD-10-CM | POA: Diagnosis not present

## 2021-01-15 DIAGNOSIS — I5043 Acute on chronic combined systolic (congestive) and diastolic (congestive) heart failure: Secondary | ICD-10-CM | POA: Diagnosis not present

## 2021-01-15 DIAGNOSIS — E119 Type 2 diabetes mellitus without complications: Secondary | ICD-10-CM | POA: Diagnosis not present

## 2021-01-15 DIAGNOSIS — Z79899 Other long term (current) drug therapy: Secondary | ICD-10-CM | POA: Diagnosis not present

## 2021-01-15 DIAGNOSIS — E871 Hypo-osmolality and hyponatremia: Secondary | ICD-10-CM | POA: Diagnosis not present

## 2021-01-15 DIAGNOSIS — F05 Delirium due to known physiological condition: Secondary | ICD-10-CM | POA: Diagnosis not present

## 2021-01-15 DIAGNOSIS — R4182 Altered mental status, unspecified: Secondary | ICD-10-CM | POA: Diagnosis not present

## 2021-01-15 DIAGNOSIS — Z950 Presence of cardiac pacemaker: Secondary | ICD-10-CM | POA: Diagnosis not present

## 2021-01-15 DIAGNOSIS — I4891 Unspecified atrial fibrillation: Secondary | ICD-10-CM | POA: Diagnosis not present

## 2021-01-15 DIAGNOSIS — I4811 Longstanding persistent atrial fibrillation: Secondary | ICD-10-CM | POA: Diagnosis not present

## 2021-01-15 DIAGNOSIS — R54 Age-related physical debility: Secondary | ICD-10-CM | POA: Diagnosis not present

## 2021-01-15 DIAGNOSIS — N390 Urinary tract infection, site not specified: Secondary | ICD-10-CM | POA: Diagnosis not present

## 2021-01-16 DIAGNOSIS — R0689 Other abnormalities of breathing: Secondary | ICD-10-CM | POA: Diagnosis not present

## 2021-01-16 DIAGNOSIS — B9620 Unspecified Escherichia coli [E. coli] as the cause of diseases classified elsewhere: Secondary | ICD-10-CM | POA: Diagnosis not present

## 2021-01-16 DIAGNOSIS — E876 Hypokalemia: Secondary | ICD-10-CM | POA: Diagnosis not present

## 2021-01-16 DIAGNOSIS — R531 Weakness: Secondary | ICD-10-CM | POA: Diagnosis not present

## 2021-01-16 DIAGNOSIS — A4102 Sepsis due to Methicillin resistant Staphylococcus aureus: Secondary | ICD-10-CM | POA: Diagnosis not present

## 2021-01-16 DIAGNOSIS — R5381 Other malaise: Secondary | ICD-10-CM | POA: Diagnosis not present

## 2021-01-16 DIAGNOSIS — E785 Hyperlipidemia, unspecified: Secondary | ICD-10-CM | POA: Diagnosis not present

## 2021-01-16 DIAGNOSIS — J432 Centrilobular emphysema: Secondary | ICD-10-CM | POA: Diagnosis not present

## 2021-01-16 DIAGNOSIS — E871 Hypo-osmolality and hyponatremia: Secondary | ICD-10-CM | POA: Diagnosis not present

## 2021-01-16 DIAGNOSIS — R410 Disorientation, unspecified: Secondary | ICD-10-CM | POA: Diagnosis not present

## 2021-01-16 DIAGNOSIS — I2693 Single subsegmental pulmonary embolism without acute cor pulmonale: Secondary | ICD-10-CM | POA: Diagnosis not present

## 2021-01-16 DIAGNOSIS — G301 Alzheimer's disease with late onset: Secondary | ICD-10-CM | POA: Diagnosis not present

## 2021-01-16 DIAGNOSIS — R6521 Severe sepsis with septic shock: Secondary | ICD-10-CM | POA: Diagnosis not present

## 2021-01-16 DIAGNOSIS — I708 Atherosclerosis of other arteries: Secondary | ICD-10-CM | POA: Diagnosis not present

## 2021-01-16 DIAGNOSIS — R32 Unspecified urinary incontinence: Secondary | ICD-10-CM | POA: Diagnosis not present

## 2021-01-16 DIAGNOSIS — I2510 Atherosclerotic heart disease of native coronary artery without angina pectoris: Secondary | ICD-10-CM | POA: Diagnosis not present

## 2021-01-16 DIAGNOSIS — K5900 Constipation, unspecified: Secondary | ICD-10-CM | POA: Diagnosis not present

## 2021-01-16 DIAGNOSIS — I442 Atrioventricular block, complete: Secondary | ICD-10-CM | POA: Diagnosis not present

## 2021-01-16 DIAGNOSIS — A419 Sepsis, unspecified organism: Secondary | ICD-10-CM | POA: Diagnosis not present

## 2021-01-16 DIAGNOSIS — G300 Alzheimer's disease with early onset: Secondary | ICD-10-CM | POA: Diagnosis not present

## 2021-01-16 DIAGNOSIS — R269 Unspecified abnormalities of gait and mobility: Secondary | ICD-10-CM | POA: Diagnosis not present

## 2021-01-16 DIAGNOSIS — F0280 Dementia in other diseases classified elsewhere without behavioral disturbance: Secondary | ICD-10-CM | POA: Diagnosis not present

## 2021-01-16 DIAGNOSIS — R918 Other nonspecific abnormal finding of lung field: Secondary | ICD-10-CM | POA: Diagnosis not present

## 2021-01-16 DIAGNOSIS — G4700 Insomnia, unspecified: Secondary | ICD-10-CM | POA: Diagnosis not present

## 2021-01-16 DIAGNOSIS — Z45018 Encounter for adjustment and management of other part of cardiac pacemaker: Secondary | ICD-10-CM | POA: Diagnosis not present

## 2021-01-16 DIAGNOSIS — E559 Vitamin D deficiency, unspecified: Secondary | ICD-10-CM | POA: Diagnosis not present

## 2021-01-16 DIAGNOSIS — E119 Type 2 diabetes mellitus without complications: Secondary | ICD-10-CM | POA: Diagnosis not present

## 2021-01-16 DIAGNOSIS — I871 Compression of vein: Secondary | ICD-10-CM | POA: Diagnosis not present

## 2021-01-16 DIAGNOSIS — I4892 Unspecified atrial flutter: Secondary | ICD-10-CM | POA: Diagnosis not present

## 2021-01-16 DIAGNOSIS — Z7901 Long term (current) use of anticoagulants: Secondary | ICD-10-CM | POA: Diagnosis not present

## 2021-01-16 DIAGNOSIS — I5023 Acute on chronic systolic (congestive) heart failure: Secondary | ICD-10-CM | POA: Diagnosis not present

## 2021-01-16 DIAGNOSIS — I447 Left bundle-branch block, unspecified: Secondary | ICD-10-CM | POA: Diagnosis not present

## 2021-01-16 DIAGNOSIS — I5042 Chronic combined systolic (congestive) and diastolic (congestive) heart failure: Secondary | ICD-10-CM | POA: Diagnosis not present

## 2021-01-16 DIAGNOSIS — R509 Fever, unspecified: Secondary | ICD-10-CM | POA: Diagnosis not present

## 2021-01-16 DIAGNOSIS — I2699 Other pulmonary embolism without acute cor pulmonale: Secondary | ICD-10-CM | POA: Diagnosis not present

## 2021-01-16 DIAGNOSIS — Z885 Allergy status to narcotic agent status: Secondary | ICD-10-CM | POA: Diagnosis not present

## 2021-01-16 DIAGNOSIS — I5043 Acute on chronic combined systolic (congestive) and diastolic (congestive) heart failure: Secondary | ICD-10-CM | POA: Diagnosis not present

## 2021-01-16 DIAGNOSIS — Z20822 Contact with and (suspected) exposure to covid-19: Secondary | ICD-10-CM | POA: Diagnosis not present

## 2021-01-16 DIAGNOSIS — N390 Urinary tract infection, site not specified: Secondary | ICD-10-CM | POA: Diagnosis not present

## 2021-01-16 DIAGNOSIS — E1151 Type 2 diabetes mellitus with diabetic peripheral angiopathy without gangrene: Secondary | ICD-10-CM | POA: Diagnosis not present

## 2021-01-16 DIAGNOSIS — R0902 Hypoxemia: Secondary | ICD-10-CM | POA: Diagnosis not present

## 2021-01-16 DIAGNOSIS — I9589 Other hypotension: Secondary | ICD-10-CM | POA: Diagnosis not present

## 2021-01-16 DIAGNOSIS — Z87891 Personal history of nicotine dependence: Secondary | ICD-10-CM | POA: Diagnosis not present

## 2021-01-16 DIAGNOSIS — I808 Phlebitis and thrombophlebitis of other sites: Secondary | ICD-10-CM | POA: Diagnosis not present

## 2021-01-16 DIAGNOSIS — I4811 Longstanding persistent atrial fibrillation: Secondary | ICD-10-CM | POA: Diagnosis not present

## 2021-01-16 DIAGNOSIS — R404 Transient alteration of awareness: Secondary | ICD-10-CM | POA: Diagnosis not present

## 2021-01-16 DIAGNOSIS — F32A Depression, unspecified: Secondary | ICD-10-CM | POA: Diagnosis not present

## 2021-01-16 DIAGNOSIS — Z951 Presence of aortocoronary bypass graft: Secondary | ICD-10-CM | POA: Diagnosis not present

## 2021-01-16 DIAGNOSIS — N3000 Acute cystitis without hematuria: Secondary | ICD-10-CM | POA: Diagnosis not present

## 2021-01-16 DIAGNOSIS — E222 Syndrome of inappropriate secretion of antidiuretic hormone: Secondary | ICD-10-CM | POA: Diagnosis not present

## 2021-01-16 DIAGNOSIS — I110 Hypertensive heart disease with heart failure: Secondary | ICD-10-CM | POA: Diagnosis not present

## 2021-01-16 DIAGNOSIS — Z953 Presence of xenogenic heart valve: Secondary | ICD-10-CM | POA: Diagnosis not present

## 2021-01-16 DIAGNOSIS — I255 Ischemic cardiomyopathy: Secondary | ICD-10-CM | POA: Diagnosis not present

## 2021-01-16 DIAGNOSIS — G250 Essential tremor: Secondary | ICD-10-CM | POA: Diagnosis not present

## 2021-01-16 DIAGNOSIS — Z950 Presence of cardiac pacemaker: Secondary | ICD-10-CM | POA: Diagnosis not present

## 2021-01-16 DIAGNOSIS — Z952 Presence of prosthetic heart valve: Secondary | ICD-10-CM | POA: Diagnosis not present

## 2021-01-16 DIAGNOSIS — M62830 Muscle spasm of back: Secondary | ICD-10-CM | POA: Diagnosis not present

## 2021-01-16 DIAGNOSIS — E1165 Type 2 diabetes mellitus with hyperglycemia: Secondary | ICD-10-CM | POA: Diagnosis not present

## 2021-01-16 DIAGNOSIS — E538 Deficiency of other specified B group vitamins: Secondary | ICD-10-CM | POA: Diagnosis not present

## 2021-01-16 DIAGNOSIS — Z7982 Long term (current) use of aspirin: Secondary | ICD-10-CM | POA: Diagnosis not present

## 2021-01-16 DIAGNOSIS — I4891 Unspecified atrial fibrillation: Secondary | ICD-10-CM | POA: Diagnosis not present

## 2021-01-17 DIAGNOSIS — Z20822 Contact with and (suspected) exposure to covid-19: Secondary | ICD-10-CM | POA: Diagnosis not present

## 2021-01-17 DIAGNOSIS — I2699 Other pulmonary embolism without acute cor pulmonale: Secondary | ICD-10-CM | POA: Diagnosis not present

## 2021-01-17 DIAGNOSIS — K5900 Constipation, unspecified: Secondary | ICD-10-CM | POA: Diagnosis not present

## 2021-01-17 DIAGNOSIS — Z9889 Other specified postprocedural states: Secondary | ICD-10-CM | POA: Diagnosis not present

## 2021-01-17 DIAGNOSIS — I5042 Chronic combined systolic (congestive) and diastolic (congestive) heart failure: Secondary | ICD-10-CM | POA: Diagnosis not present

## 2021-01-17 DIAGNOSIS — D62 Acute posthemorrhagic anemia: Secondary | ICD-10-CM | POA: Diagnosis not present

## 2021-01-17 DIAGNOSIS — I480 Paroxysmal atrial fibrillation: Secondary | ICD-10-CM | POA: Diagnosis not present

## 2021-01-17 DIAGNOSIS — I739 Peripheral vascular disease, unspecified: Secondary | ICD-10-CM | POA: Diagnosis not present

## 2021-01-17 DIAGNOSIS — I4819 Other persistent atrial fibrillation: Secondary | ICD-10-CM | POA: Diagnosis not present

## 2021-01-17 DIAGNOSIS — E785 Hyperlipidemia, unspecified: Secondary | ICD-10-CM | POA: Diagnosis not present

## 2021-01-17 DIAGNOSIS — I517 Cardiomegaly: Secondary | ICD-10-CM | POA: Diagnosis not present

## 2021-01-17 DIAGNOSIS — R410 Disorientation, unspecified: Secondary | ICD-10-CM | POA: Diagnosis not present

## 2021-01-17 DIAGNOSIS — G309 Alzheimer's disease, unspecified: Secondary | ICD-10-CM | POA: Diagnosis not present

## 2021-01-17 DIAGNOSIS — R0689 Other abnormalities of breathing: Secondary | ICD-10-CM | POA: Diagnosis not present

## 2021-01-17 DIAGNOSIS — R7881 Bacteremia: Secondary | ICD-10-CM | POA: Diagnosis not present

## 2021-01-17 DIAGNOSIS — I083 Combined rheumatic disorders of mitral, aortic and tricuspid valves: Secondary | ICD-10-CM | POA: Diagnosis not present

## 2021-01-17 DIAGNOSIS — E119 Type 2 diabetes mellitus without complications: Secondary | ICD-10-CM | POA: Diagnosis not present

## 2021-01-17 DIAGNOSIS — Z951 Presence of aortocoronary bypass graft: Secondary | ICD-10-CM | POA: Diagnosis not present

## 2021-01-17 DIAGNOSIS — R918 Other nonspecific abnormal finding of lung field: Secondary | ICD-10-CM | POA: Diagnosis not present

## 2021-01-17 DIAGNOSIS — M47816 Spondylosis without myelopathy or radiculopathy, lumbar region: Secondary | ICD-10-CM | POA: Diagnosis not present

## 2021-01-17 DIAGNOSIS — I708 Atherosclerosis of other arteries: Secondary | ICD-10-CM | POA: Diagnosis not present

## 2021-01-17 DIAGNOSIS — I6523 Occlusion and stenosis of bilateral carotid arteries: Secondary | ICD-10-CM | POA: Diagnosis not present

## 2021-01-17 DIAGNOSIS — I5022 Chronic systolic (congestive) heart failure: Secondary | ICD-10-CM | POA: Diagnosis not present

## 2021-01-17 DIAGNOSIS — F0280 Dementia in other diseases classified elsewhere without behavioral disturbance: Secondary | ICD-10-CM | POA: Diagnosis not present

## 2021-01-17 DIAGNOSIS — Z48812 Encounter for surgical aftercare following surgery on the circulatory system: Secondary | ICD-10-CM | POA: Diagnosis not present

## 2021-01-17 DIAGNOSIS — Z7901 Long term (current) use of anticoagulants: Secondary | ICD-10-CM | POA: Diagnosis not present

## 2021-01-17 DIAGNOSIS — R109 Unspecified abdominal pain: Secondary | ICD-10-CM | POA: Diagnosis not present

## 2021-01-17 DIAGNOSIS — I420 Dilated cardiomyopathy: Secondary | ICD-10-CM | POA: Diagnosis not present

## 2021-01-17 DIAGNOSIS — R4182 Altered mental status, unspecified: Secondary | ICD-10-CM | POA: Diagnosis not present

## 2021-01-17 DIAGNOSIS — I5043 Acute on chronic combined systolic (congestive) and diastolic (congestive) heart failure: Secondary | ICD-10-CM | POA: Diagnosis not present

## 2021-01-17 DIAGNOSIS — N3000 Acute cystitis without hematuria: Secondary | ICD-10-CM | POA: Diagnosis not present

## 2021-01-17 DIAGNOSIS — E876 Hypokalemia: Secondary | ICD-10-CM | POA: Diagnosis not present

## 2021-01-17 DIAGNOSIS — E782 Mixed hyperlipidemia: Secondary | ICD-10-CM | POA: Diagnosis not present

## 2021-01-17 DIAGNOSIS — J90 Pleural effusion, not elsewhere classified: Secondary | ICD-10-CM | POA: Diagnosis not present

## 2021-01-17 DIAGNOSIS — R404 Transient alteration of awareness: Secondary | ICD-10-CM | POA: Diagnosis not present

## 2021-01-17 DIAGNOSIS — Z45018 Encounter for adjustment and management of other part of cardiac pacemaker: Secondary | ICD-10-CM | POA: Diagnosis not present

## 2021-01-17 DIAGNOSIS — I330 Acute and subacute infective endocarditis: Secondary | ICD-10-CM | POA: Diagnosis not present

## 2021-01-17 DIAGNOSIS — J9811 Atelectasis: Secondary | ICD-10-CM | POA: Diagnosis not present

## 2021-01-17 DIAGNOSIS — Z885 Allergy status to narcotic agent status: Secondary | ICD-10-CM | POA: Diagnosis not present

## 2021-01-17 DIAGNOSIS — I110 Hypertensive heart disease with heart failure: Secondary | ICD-10-CM | POA: Diagnosis not present

## 2021-01-17 DIAGNOSIS — I442 Atrioventricular block, complete: Secondary | ICD-10-CM | POA: Diagnosis not present

## 2021-01-17 DIAGNOSIS — R509 Fever, unspecified: Secondary | ICD-10-CM | POA: Diagnosis not present

## 2021-01-17 DIAGNOSIS — A4102 Sepsis due to Methicillin resistant Staphylococcus aureus: Secondary | ICD-10-CM | POA: Diagnosis not present

## 2021-01-17 DIAGNOSIS — I25810 Atherosclerosis of coronary artery bypass graft(s) without angina pectoris: Secondary | ICD-10-CM | POA: Diagnosis not present

## 2021-01-17 DIAGNOSIS — B9562 Methicillin resistant Staphylococcus aureus infection as the cause of diseases classified elsewhere: Secondary | ICD-10-CM | POA: Diagnosis not present

## 2021-01-17 DIAGNOSIS — Z87891 Personal history of nicotine dependence: Secondary | ICD-10-CM | POA: Diagnosis not present

## 2021-01-17 DIAGNOSIS — I2510 Atherosclerotic heart disease of native coronary artery without angina pectoris: Secondary | ICD-10-CM | POA: Diagnosis not present

## 2021-01-17 DIAGNOSIS — I255 Ischemic cardiomyopathy: Secondary | ICD-10-CM | POA: Diagnosis not present

## 2021-01-17 DIAGNOSIS — R0902 Hypoxemia: Secondary | ICD-10-CM | POA: Diagnosis not present

## 2021-01-17 DIAGNOSIS — M47814 Spondylosis without myelopathy or radiculopathy, thoracic region: Secondary | ICD-10-CM | POA: Diagnosis not present

## 2021-01-17 DIAGNOSIS — I509 Heart failure, unspecified: Secondary | ICD-10-CM | POA: Diagnosis not present

## 2021-01-17 DIAGNOSIS — E871 Hypo-osmolality and hyponatremia: Secondary | ICD-10-CM | POA: Diagnosis not present

## 2021-01-17 DIAGNOSIS — Z950 Presence of cardiac pacemaker: Secondary | ICD-10-CM | POA: Diagnosis not present

## 2021-01-17 DIAGNOSIS — I9589 Other hypotension: Secondary | ICD-10-CM | POA: Diagnosis not present

## 2021-01-17 DIAGNOSIS — J432 Centrilobular emphysema: Secondary | ICD-10-CM | POA: Diagnosis not present

## 2021-01-17 DIAGNOSIS — I447 Left bundle-branch block, unspecified: Secondary | ICD-10-CM | POA: Diagnosis not present

## 2021-01-17 DIAGNOSIS — I2693 Single subsegmental pulmonary embolism without acute cor pulmonale: Secondary | ICD-10-CM | POA: Diagnosis not present

## 2021-01-17 DIAGNOSIS — Z952 Presence of prosthetic heart valve: Secondary | ICD-10-CM | POA: Diagnosis not present

## 2021-01-17 DIAGNOSIS — R6521 Severe sepsis with septic shock: Secondary | ICD-10-CM | POA: Diagnosis not present

## 2021-01-17 DIAGNOSIS — M47812 Spondylosis without myelopathy or radiculopathy, cervical region: Secondary | ICD-10-CM | POA: Diagnosis not present

## 2021-01-17 DIAGNOSIS — I5023 Acute on chronic systolic (congestive) heart failure: Secondary | ICD-10-CM | POA: Diagnosis not present

## 2021-01-17 DIAGNOSIS — M62830 Muscle spasm of back: Secondary | ICD-10-CM | POA: Diagnosis not present

## 2021-01-17 DIAGNOSIS — G300 Alzheimer's disease with early onset: Secondary | ICD-10-CM | POA: Diagnosis not present

## 2021-01-17 DIAGNOSIS — R5381 Other malaise: Secondary | ICD-10-CM | POA: Diagnosis not present

## 2021-01-17 DIAGNOSIS — R32 Unspecified urinary incontinence: Secondary | ICD-10-CM | POA: Diagnosis not present

## 2021-01-17 DIAGNOSIS — I871 Compression of vein: Secondary | ICD-10-CM | POA: Diagnosis not present

## 2021-01-17 DIAGNOSIS — E1151 Type 2 diabetes mellitus with diabetic peripheral angiopathy without gangrene: Secondary | ICD-10-CM | POA: Diagnosis not present

## 2021-01-17 DIAGNOSIS — I10 Essential (primary) hypertension: Secondary | ICD-10-CM | POA: Diagnosis not present

## 2021-01-17 DIAGNOSIS — E1165 Type 2 diabetes mellitus with hyperglycemia: Secondary | ICD-10-CM | POA: Diagnosis not present

## 2021-01-17 DIAGNOSIS — E222 Syndrome of inappropriate secretion of antidiuretic hormone: Secondary | ICD-10-CM | POA: Diagnosis not present

## 2021-01-17 DIAGNOSIS — G8918 Other acute postprocedural pain: Secondary | ICD-10-CM | POA: Diagnosis not present

## 2021-01-17 DIAGNOSIS — Z45010 Encounter for checking and testing of cardiac pacemaker pulse generator [battery]: Secondary | ICD-10-CM | POA: Diagnosis not present

## 2021-01-17 DIAGNOSIS — A419 Sepsis, unspecified organism: Secondary | ICD-10-CM | POA: Diagnosis not present

## 2021-01-17 DIAGNOSIS — I4891 Unspecified atrial fibrillation: Secondary | ICD-10-CM | POA: Diagnosis not present

## 2021-01-17 DIAGNOSIS — R531 Weakness: Secondary | ICD-10-CM | POA: Diagnosis not present

## 2021-01-17 DIAGNOSIS — F32A Depression, unspecified: Secondary | ICD-10-CM | POA: Diagnosis not present

## 2021-01-17 DIAGNOSIS — Z953 Presence of xenogenic heart valve: Secondary | ICD-10-CM | POA: Diagnosis not present

## 2021-01-18 DIAGNOSIS — R7881 Bacteremia: Secondary | ICD-10-CM | POA: Diagnosis not present

## 2021-01-18 DIAGNOSIS — I5023 Acute on chronic systolic (congestive) heart failure: Secondary | ICD-10-CM | POA: Diagnosis not present

## 2021-01-18 DIAGNOSIS — I10 Essential (primary) hypertension: Secondary | ICD-10-CM | POA: Diagnosis not present

## 2021-01-18 DIAGNOSIS — B9562 Methicillin resistant Staphylococcus aureus infection as the cause of diseases classified elsewhere: Secondary | ICD-10-CM | POA: Diagnosis not present

## 2021-01-18 DIAGNOSIS — R4182 Altered mental status, unspecified: Secondary | ICD-10-CM | POA: Diagnosis not present

## 2021-01-18 DIAGNOSIS — E785 Hyperlipidemia, unspecified: Secondary | ICD-10-CM | POA: Diagnosis not present

## 2021-01-18 DIAGNOSIS — Z950 Presence of cardiac pacemaker: Secondary | ICD-10-CM | POA: Diagnosis not present

## 2021-01-18 DIAGNOSIS — R6521 Severe sepsis with septic shock: Secondary | ICD-10-CM | POA: Diagnosis not present

## 2021-01-18 DIAGNOSIS — Z952 Presence of prosthetic heart valve: Secondary | ICD-10-CM | POA: Diagnosis not present

## 2021-01-18 DIAGNOSIS — I110 Hypertensive heart disease with heart failure: Secondary | ICD-10-CM | POA: Diagnosis not present

## 2021-01-18 DIAGNOSIS — I25810 Atherosclerosis of coronary artery bypass graft(s) without angina pectoris: Secondary | ICD-10-CM | POA: Diagnosis not present

## 2021-01-18 DIAGNOSIS — I4891 Unspecified atrial fibrillation: Secondary | ICD-10-CM | POA: Diagnosis not present

## 2021-01-18 DIAGNOSIS — Z7901 Long term (current) use of anticoagulants: Secondary | ICD-10-CM | POA: Diagnosis not present

## 2021-01-18 DIAGNOSIS — E119 Type 2 diabetes mellitus without complications: Secondary | ICD-10-CM | POA: Diagnosis not present

## 2021-01-18 DIAGNOSIS — I6523 Occlusion and stenosis of bilateral carotid arteries: Secondary | ICD-10-CM | POA: Diagnosis not present

## 2021-01-18 DIAGNOSIS — Z9889 Other specified postprocedural states: Secondary | ICD-10-CM | POA: Diagnosis not present

## 2021-01-18 DIAGNOSIS — A419 Sepsis, unspecified organism: Secondary | ICD-10-CM | POA: Diagnosis not present

## 2021-01-18 DIAGNOSIS — Z951 Presence of aortocoronary bypass graft: Secondary | ICD-10-CM | POA: Diagnosis not present

## 2021-01-19 DIAGNOSIS — I2510 Atherosclerotic heart disease of native coronary artery without angina pectoris: Secondary | ICD-10-CM | POA: Diagnosis not present

## 2021-01-19 DIAGNOSIS — M47812 Spondylosis without myelopathy or radiculopathy, cervical region: Secondary | ICD-10-CM | POA: Diagnosis not present

## 2021-01-19 DIAGNOSIS — A419 Sepsis, unspecified organism: Secondary | ICD-10-CM | POA: Diagnosis not present

## 2021-01-19 DIAGNOSIS — Z952 Presence of prosthetic heart valve: Secondary | ICD-10-CM | POA: Diagnosis not present

## 2021-01-19 DIAGNOSIS — D62 Acute posthemorrhagic anemia: Secondary | ICD-10-CM | POA: Diagnosis not present

## 2021-01-19 DIAGNOSIS — Z48812 Encounter for surgical aftercare following surgery on the circulatory system: Secondary | ICD-10-CM | POA: Diagnosis not present

## 2021-01-19 DIAGNOSIS — M47814 Spondylosis without myelopathy or radiculopathy, thoracic region: Secondary | ICD-10-CM | POA: Diagnosis not present

## 2021-01-19 DIAGNOSIS — I10 Essential (primary) hypertension: Secondary | ICD-10-CM | POA: Diagnosis not present

## 2021-01-19 DIAGNOSIS — Z951 Presence of aortocoronary bypass graft: Secondary | ICD-10-CM | POA: Diagnosis not present

## 2021-01-19 DIAGNOSIS — G8918 Other acute postprocedural pain: Secondary | ICD-10-CM | POA: Diagnosis not present

## 2021-01-19 DIAGNOSIS — M47816 Spondylosis without myelopathy or radiculopathy, lumbar region: Secondary | ICD-10-CM | POA: Diagnosis not present

## 2021-01-19 DIAGNOSIS — I083 Combined rheumatic disorders of mitral, aortic and tricuspid valves: Secondary | ICD-10-CM | POA: Diagnosis not present

## 2021-01-19 DIAGNOSIS — Z950 Presence of cardiac pacemaker: Secondary | ICD-10-CM | POA: Diagnosis not present

## 2021-01-19 DIAGNOSIS — R4182 Altered mental status, unspecified: Secondary | ICD-10-CM | POA: Diagnosis not present

## 2021-01-19 DIAGNOSIS — R6521 Severe sepsis with septic shock: Secondary | ICD-10-CM | POA: Diagnosis not present

## 2021-01-19 DIAGNOSIS — R7881 Bacteremia: Secondary | ICD-10-CM | POA: Diagnosis not present

## 2021-01-19 DIAGNOSIS — B9562 Methicillin resistant Staphylococcus aureus infection as the cause of diseases classified elsewhere: Secondary | ICD-10-CM | POA: Diagnosis not present

## 2021-01-20 DIAGNOSIS — I10 Essential (primary) hypertension: Secondary | ICD-10-CM | POA: Diagnosis not present

## 2021-01-20 DIAGNOSIS — A419 Sepsis, unspecified organism: Secondary | ICD-10-CM | POA: Diagnosis not present

## 2021-01-20 DIAGNOSIS — E119 Type 2 diabetes mellitus without complications: Secondary | ICD-10-CM | POA: Diagnosis not present

## 2021-01-20 DIAGNOSIS — Z950 Presence of cardiac pacemaker: Secondary | ICD-10-CM | POA: Diagnosis not present

## 2021-01-20 DIAGNOSIS — I4891 Unspecified atrial fibrillation: Secondary | ICD-10-CM | POA: Diagnosis not present

## 2021-01-20 DIAGNOSIS — Z952 Presence of prosthetic heart valve: Secondary | ICD-10-CM | POA: Diagnosis not present

## 2021-01-20 DIAGNOSIS — I4819 Other persistent atrial fibrillation: Secondary | ICD-10-CM | POA: Diagnosis not present

## 2021-01-20 DIAGNOSIS — Z951 Presence of aortocoronary bypass graft: Secondary | ICD-10-CM | POA: Diagnosis not present

## 2021-01-20 DIAGNOSIS — B9562 Methicillin resistant Staphylococcus aureus infection as the cause of diseases classified elsewhere: Secondary | ICD-10-CM | POA: Diagnosis not present

## 2021-01-20 DIAGNOSIS — R4182 Altered mental status, unspecified: Secondary | ICD-10-CM | POA: Diagnosis not present

## 2021-01-20 DIAGNOSIS — R7881 Bacteremia: Secondary | ICD-10-CM | POA: Diagnosis not present

## 2021-01-20 DIAGNOSIS — R6521 Severe sepsis with septic shock: Secondary | ICD-10-CM | POA: Diagnosis not present

## 2021-01-20 DIAGNOSIS — I6523 Occlusion and stenosis of bilateral carotid arteries: Secondary | ICD-10-CM | POA: Diagnosis not present

## 2021-01-21 DIAGNOSIS — I6523 Occlusion and stenosis of bilateral carotid arteries: Secondary | ICD-10-CM | POA: Diagnosis not present

## 2021-01-21 DIAGNOSIS — E119 Type 2 diabetes mellitus without complications: Secondary | ICD-10-CM | POA: Diagnosis not present

## 2021-01-21 DIAGNOSIS — I10 Essential (primary) hypertension: Secondary | ICD-10-CM | POA: Diagnosis not present

## 2021-01-21 DIAGNOSIS — R6521 Severe sepsis with septic shock: Secondary | ICD-10-CM | POA: Diagnosis not present

## 2021-01-21 DIAGNOSIS — A419 Sepsis, unspecified organism: Secondary | ICD-10-CM | POA: Diagnosis not present

## 2021-01-21 DIAGNOSIS — R7881 Bacteremia: Secondary | ICD-10-CM | POA: Diagnosis not present

## 2021-01-21 DIAGNOSIS — B9562 Methicillin resistant Staphylococcus aureus infection as the cause of diseases classified elsewhere: Secondary | ICD-10-CM | POA: Diagnosis not present

## 2021-01-21 DIAGNOSIS — R4182 Altered mental status, unspecified: Secondary | ICD-10-CM | POA: Diagnosis not present

## 2021-01-21 DIAGNOSIS — I4891 Unspecified atrial fibrillation: Secondary | ICD-10-CM | POA: Diagnosis not present

## 2021-01-22 DIAGNOSIS — R7881 Bacteremia: Secondary | ICD-10-CM | POA: Diagnosis not present

## 2021-01-22 DIAGNOSIS — I6523 Occlusion and stenosis of bilateral carotid arteries: Secondary | ICD-10-CM | POA: Diagnosis not present

## 2021-01-22 DIAGNOSIS — I10 Essential (primary) hypertension: Secondary | ICD-10-CM | POA: Diagnosis not present

## 2021-01-22 DIAGNOSIS — A419 Sepsis, unspecified organism: Secondary | ICD-10-CM | POA: Diagnosis not present

## 2021-01-22 DIAGNOSIS — B9562 Methicillin resistant Staphylococcus aureus infection as the cause of diseases classified elsewhere: Secondary | ICD-10-CM | POA: Diagnosis not present

## 2021-01-22 DIAGNOSIS — R6521 Severe sepsis with septic shock: Secondary | ICD-10-CM | POA: Diagnosis not present

## 2021-01-22 DIAGNOSIS — E119 Type 2 diabetes mellitus without complications: Secondary | ICD-10-CM | POA: Diagnosis not present

## 2021-01-22 DIAGNOSIS — I4891 Unspecified atrial fibrillation: Secondary | ICD-10-CM | POA: Diagnosis not present

## 2021-01-22 DIAGNOSIS — R4182 Altered mental status, unspecified: Secondary | ICD-10-CM | POA: Diagnosis not present

## 2021-01-23 DIAGNOSIS — G309 Alzheimer's disease, unspecified: Secondary | ICD-10-CM | POA: Diagnosis not present

## 2021-01-23 DIAGNOSIS — I4819 Other persistent atrial fibrillation: Secondary | ICD-10-CM | POA: Diagnosis not present

## 2021-01-23 DIAGNOSIS — I330 Acute and subacute infective endocarditis: Secondary | ICD-10-CM | POA: Diagnosis not present

## 2021-01-23 DIAGNOSIS — Z950 Presence of cardiac pacemaker: Secondary | ICD-10-CM | POA: Diagnosis not present

## 2021-01-23 DIAGNOSIS — I447 Left bundle-branch block, unspecified: Secondary | ICD-10-CM | POA: Diagnosis not present

## 2021-01-23 DIAGNOSIS — F0280 Dementia in other diseases classified elsewhere without behavioral disturbance: Secondary | ICD-10-CM | POA: Diagnosis not present

## 2021-01-23 DIAGNOSIS — R109 Unspecified abdominal pain: Secondary | ICD-10-CM | POA: Diagnosis not present

## 2021-01-23 DIAGNOSIS — K5900 Constipation, unspecified: Secondary | ICD-10-CM | POA: Diagnosis not present

## 2021-01-23 DIAGNOSIS — Z45010 Encounter for checking and testing of cardiac pacemaker pulse generator [battery]: Secondary | ICD-10-CM | POA: Diagnosis not present

## 2021-01-23 DIAGNOSIS — R7881 Bacteremia: Secondary | ICD-10-CM | POA: Diagnosis not present

## 2021-01-23 DIAGNOSIS — R6521 Severe sepsis with septic shock: Secondary | ICD-10-CM | POA: Diagnosis not present

## 2021-01-23 DIAGNOSIS — A419 Sepsis, unspecified organism: Secondary | ICD-10-CM | POA: Diagnosis not present

## 2021-01-23 DIAGNOSIS — B9562 Methicillin resistant Staphylococcus aureus infection as the cause of diseases classified elsewhere: Secondary | ICD-10-CM | POA: Diagnosis not present

## 2021-01-24 DIAGNOSIS — B9562 Methicillin resistant Staphylococcus aureus infection as the cause of diseases classified elsewhere: Secondary | ICD-10-CM | POA: Diagnosis not present

## 2021-01-24 DIAGNOSIS — A419 Sepsis, unspecified organism: Secondary | ICD-10-CM | POA: Diagnosis not present

## 2021-01-24 DIAGNOSIS — J9811 Atelectasis: Secondary | ICD-10-CM | POA: Diagnosis not present

## 2021-01-24 DIAGNOSIS — R7881 Bacteremia: Secondary | ICD-10-CM | POA: Diagnosis not present

## 2021-01-24 DIAGNOSIS — J90 Pleural effusion, not elsewhere classified: Secondary | ICD-10-CM | POA: Diagnosis not present

## 2021-01-24 DIAGNOSIS — Z45018 Encounter for adjustment and management of other part of cardiac pacemaker: Secondary | ICD-10-CM | POA: Diagnosis not present

## 2021-01-24 DIAGNOSIS — R6521 Severe sepsis with septic shock: Secondary | ICD-10-CM | POA: Diagnosis not present

## 2021-01-24 DIAGNOSIS — I517 Cardiomegaly: Secondary | ICD-10-CM | POA: Diagnosis not present

## 2021-01-25 DIAGNOSIS — R6521 Severe sepsis with septic shock: Secondary | ICD-10-CM | POA: Diagnosis not present

## 2021-01-25 DIAGNOSIS — A419 Sepsis, unspecified organism: Secondary | ICD-10-CM | POA: Diagnosis not present

## 2021-01-26 DIAGNOSIS — R7881 Bacteremia: Secondary | ICD-10-CM | POA: Diagnosis not present

## 2021-01-26 DIAGNOSIS — I480 Paroxysmal atrial fibrillation: Secondary | ICD-10-CM | POA: Diagnosis not present

## 2021-01-26 DIAGNOSIS — Z950 Presence of cardiac pacemaker: Secondary | ICD-10-CM | POA: Diagnosis not present

## 2021-01-26 DIAGNOSIS — A419 Sepsis, unspecified organism: Secondary | ICD-10-CM | POA: Diagnosis not present

## 2021-01-26 DIAGNOSIS — B9562 Methicillin resistant Staphylococcus aureus infection as the cause of diseases classified elsewhere: Secondary | ICD-10-CM | POA: Diagnosis not present

## 2021-01-26 DIAGNOSIS — R6521 Severe sepsis with septic shock: Secondary | ICD-10-CM | POA: Diagnosis not present

## 2021-01-27 DIAGNOSIS — Z950 Presence of cardiac pacemaker: Secondary | ICD-10-CM | POA: Diagnosis not present

## 2021-01-27 DIAGNOSIS — R6521 Severe sepsis with septic shock: Secondary | ICD-10-CM | POA: Diagnosis not present

## 2021-01-27 DIAGNOSIS — E119 Type 2 diabetes mellitus without complications: Secondary | ICD-10-CM | POA: Diagnosis not present

## 2021-01-27 DIAGNOSIS — I447 Left bundle-branch block, unspecified: Secondary | ICD-10-CM | POA: Diagnosis not present

## 2021-01-27 DIAGNOSIS — A419 Sepsis, unspecified organism: Secondary | ICD-10-CM | POA: Diagnosis not present

## 2021-01-27 DIAGNOSIS — Z953 Presence of xenogenic heart valve: Secondary | ICD-10-CM | POA: Diagnosis not present

## 2021-01-27 DIAGNOSIS — I420 Dilated cardiomyopathy: Secondary | ICD-10-CM | POA: Diagnosis not present

## 2021-01-27 DIAGNOSIS — Z951 Presence of aortocoronary bypass graft: Secondary | ICD-10-CM | POA: Diagnosis not present

## 2021-01-27 DIAGNOSIS — I509 Heart failure, unspecified: Secondary | ICD-10-CM | POA: Diagnosis not present

## 2021-01-27 DIAGNOSIS — I5022 Chronic systolic (congestive) heart failure: Secondary | ICD-10-CM | POA: Diagnosis not present

## 2021-01-27 DIAGNOSIS — I4819 Other persistent atrial fibrillation: Secondary | ICD-10-CM | POA: Diagnosis not present

## 2021-01-27 DIAGNOSIS — E785 Hyperlipidemia, unspecified: Secondary | ICD-10-CM | POA: Diagnosis not present

## 2021-01-27 DIAGNOSIS — I110 Hypertensive heart disease with heart failure: Secondary | ICD-10-CM | POA: Diagnosis not present

## 2021-01-28 DIAGNOSIS — Z952 Presence of prosthetic heart valve: Secondary | ICD-10-CM | POA: Diagnosis not present

## 2021-01-28 DIAGNOSIS — E119 Type 2 diabetes mellitus without complications: Secondary | ICD-10-CM | POA: Diagnosis not present

## 2021-01-28 DIAGNOSIS — I255 Ischemic cardiomyopathy: Secondary | ICD-10-CM | POA: Diagnosis not present

## 2021-01-28 DIAGNOSIS — J90 Pleural effusion, not elsewhere classified: Secondary | ICD-10-CM | POA: Diagnosis not present

## 2021-01-28 DIAGNOSIS — I480 Paroxysmal atrial fibrillation: Secondary | ICD-10-CM | POA: Diagnosis not present

## 2021-01-28 DIAGNOSIS — I10 Essential (primary) hypertension: Secondary | ICD-10-CM | POA: Diagnosis not present

## 2021-01-28 DIAGNOSIS — Z950 Presence of cardiac pacemaker: Secondary | ICD-10-CM | POA: Diagnosis not present

## 2021-01-28 DIAGNOSIS — I739 Peripheral vascular disease, unspecified: Secondary | ICD-10-CM | POA: Diagnosis not present

## 2021-01-28 DIAGNOSIS — Z951 Presence of aortocoronary bypass graft: Secondary | ICD-10-CM | POA: Diagnosis not present

## 2021-01-28 DIAGNOSIS — I5042 Chronic combined systolic (congestive) and diastolic (congestive) heart failure: Secondary | ICD-10-CM | POA: Diagnosis not present

## 2021-01-28 DIAGNOSIS — G309 Alzheimer's disease, unspecified: Secondary | ICD-10-CM | POA: Diagnosis not present

## 2021-01-28 DIAGNOSIS — I2693 Single subsegmental pulmonary embolism without acute cor pulmonale: Secondary | ICD-10-CM | POA: Diagnosis not present

## 2021-01-28 DIAGNOSIS — A419 Sepsis, unspecified organism: Secondary | ICD-10-CM | POA: Diagnosis not present

## 2021-01-28 DIAGNOSIS — R7881 Bacteremia: Secondary | ICD-10-CM | POA: Diagnosis not present

## 2021-01-28 DIAGNOSIS — R6521 Severe sepsis with septic shock: Secondary | ICD-10-CM | POA: Diagnosis not present

## 2021-01-28 DIAGNOSIS — E782 Mixed hyperlipidemia: Secondary | ICD-10-CM | POA: Diagnosis not present

## 2021-01-29 DIAGNOSIS — R7881 Bacteremia: Secondary | ICD-10-CM | POA: Diagnosis not present

## 2021-01-29 DIAGNOSIS — I5042 Chronic combined systolic (congestive) and diastolic (congestive) heart failure: Secondary | ICD-10-CM | POA: Diagnosis not present

## 2021-01-29 DIAGNOSIS — I739 Peripheral vascular disease, unspecified: Secondary | ICD-10-CM | POA: Diagnosis not present

## 2021-01-29 DIAGNOSIS — I255 Ischemic cardiomyopathy: Secondary | ICD-10-CM | POA: Diagnosis not present

## 2021-01-29 DIAGNOSIS — I10 Essential (primary) hypertension: Secondary | ICD-10-CM | POA: Diagnosis not present

## 2021-01-29 DIAGNOSIS — Z950 Presence of cardiac pacemaker: Secondary | ICD-10-CM | POA: Diagnosis not present

## 2021-01-29 DIAGNOSIS — E119 Type 2 diabetes mellitus without complications: Secondary | ICD-10-CM | POA: Diagnosis not present

## 2021-01-29 DIAGNOSIS — I2693 Single subsegmental pulmonary embolism without acute cor pulmonale: Secondary | ICD-10-CM | POA: Diagnosis not present

## 2021-01-29 DIAGNOSIS — G309 Alzheimer's disease, unspecified: Secondary | ICD-10-CM | POA: Diagnosis not present

## 2021-01-29 DIAGNOSIS — I480 Paroxysmal atrial fibrillation: Secondary | ICD-10-CM | POA: Diagnosis not present

## 2021-01-29 DIAGNOSIS — E782 Mixed hyperlipidemia: Secondary | ICD-10-CM | POA: Diagnosis not present

## 2021-01-29 DIAGNOSIS — Z951 Presence of aortocoronary bypass graft: Secondary | ICD-10-CM | POA: Diagnosis not present

## 2021-01-30 DIAGNOSIS — B9562 Methicillin resistant Staphylococcus aureus infection as the cause of diseases classified elsewhere: Secondary | ICD-10-CM | POA: Diagnosis not present

## 2021-01-30 DIAGNOSIS — G309 Alzheimer's disease, unspecified: Secondary | ICD-10-CM | POA: Diagnosis not present

## 2021-01-30 DIAGNOSIS — Z951 Presence of aortocoronary bypass graft: Secondary | ICD-10-CM | POA: Diagnosis not present

## 2021-01-30 DIAGNOSIS — E119 Type 2 diabetes mellitus without complications: Secondary | ICD-10-CM | POA: Diagnosis not present

## 2021-01-30 DIAGNOSIS — I5042 Chronic combined systolic (congestive) and diastolic (congestive) heart failure: Secondary | ICD-10-CM | POA: Diagnosis not present

## 2021-01-30 DIAGNOSIS — Z950 Presence of cardiac pacemaker: Secondary | ICD-10-CM | POA: Diagnosis not present

## 2021-01-30 DIAGNOSIS — I480 Paroxysmal atrial fibrillation: Secondary | ICD-10-CM | POA: Diagnosis not present

## 2021-01-30 DIAGNOSIS — I10 Essential (primary) hypertension: Secondary | ICD-10-CM | POA: Diagnosis not present

## 2021-01-30 DIAGNOSIS — R7881 Bacteremia: Secondary | ICD-10-CM | POA: Diagnosis not present

## 2021-01-30 DIAGNOSIS — I739 Peripheral vascular disease, unspecified: Secondary | ICD-10-CM | POA: Diagnosis not present

## 2021-01-30 DIAGNOSIS — I255 Ischemic cardiomyopathy: Secondary | ICD-10-CM | POA: Diagnosis not present

## 2021-01-30 DIAGNOSIS — I2693 Single subsegmental pulmonary embolism without acute cor pulmonale: Secondary | ICD-10-CM | POA: Diagnosis not present

## 2021-01-30 DIAGNOSIS — E782 Mixed hyperlipidemia: Secondary | ICD-10-CM | POA: Diagnosis not present

## 2021-01-31 DIAGNOSIS — I255 Ischemic cardiomyopathy: Secondary | ICD-10-CM | POA: Diagnosis not present

## 2021-01-31 DIAGNOSIS — I480 Paroxysmal atrial fibrillation: Secondary | ICD-10-CM | POA: Diagnosis not present

## 2021-01-31 DIAGNOSIS — I739 Peripheral vascular disease, unspecified: Secondary | ICD-10-CM | POA: Diagnosis not present

## 2021-01-31 DIAGNOSIS — Z950 Presence of cardiac pacemaker: Secondary | ICD-10-CM | POA: Diagnosis not present

## 2021-01-31 DIAGNOSIS — I2693 Single subsegmental pulmonary embolism without acute cor pulmonale: Secondary | ICD-10-CM | POA: Diagnosis not present

## 2021-01-31 DIAGNOSIS — Z951 Presence of aortocoronary bypass graft: Secondary | ICD-10-CM | POA: Diagnosis not present

## 2021-01-31 DIAGNOSIS — I5042 Chronic combined systolic (congestive) and diastolic (congestive) heart failure: Secondary | ICD-10-CM | POA: Diagnosis not present

## 2021-01-31 DIAGNOSIS — E782 Mixed hyperlipidemia: Secondary | ICD-10-CM | POA: Diagnosis not present

## 2021-01-31 DIAGNOSIS — G309 Alzheimer's disease, unspecified: Secondary | ICD-10-CM | POA: Diagnosis not present

## 2021-01-31 DIAGNOSIS — E119 Type 2 diabetes mellitus without complications: Secondary | ICD-10-CM | POA: Diagnosis not present

## 2021-01-31 DIAGNOSIS — I10 Essential (primary) hypertension: Secondary | ICD-10-CM | POA: Diagnosis not present

## 2021-01-31 DIAGNOSIS — R7881 Bacteremia: Secondary | ICD-10-CM | POA: Diagnosis not present

## 2021-02-01 DIAGNOSIS — B9562 Methicillin resistant Staphylococcus aureus infection as the cause of diseases classified elsewhere: Secondary | ICD-10-CM | POA: Diagnosis not present

## 2021-02-01 DIAGNOSIS — R7881 Bacteremia: Secondary | ICD-10-CM | POA: Diagnosis not present

## 2021-02-02 DIAGNOSIS — R7881 Bacteremia: Secondary | ICD-10-CM | POA: Diagnosis not present

## 2021-02-02 DIAGNOSIS — B9562 Methicillin resistant Staphylococcus aureus infection as the cause of diseases classified elsewhere: Secondary | ICD-10-CM | POA: Diagnosis not present

## 2021-02-03 DIAGNOSIS — B9562 Methicillin resistant Staphylococcus aureus infection as the cause of diseases classified elsewhere: Secondary | ICD-10-CM | POA: Diagnosis not present

## 2021-02-03 DIAGNOSIS — R7881 Bacteremia: Secondary | ICD-10-CM | POA: Diagnosis not present

## 2021-02-04 DIAGNOSIS — R7881 Bacteremia: Secondary | ICD-10-CM | POA: Diagnosis not present

## 2021-02-04 DIAGNOSIS — B9562 Methicillin resistant Staphylococcus aureus infection as the cause of diseases classified elsewhere: Secondary | ICD-10-CM | POA: Diagnosis not present

## 2021-02-05 DIAGNOSIS — R7881 Bacteremia: Secondary | ICD-10-CM | POA: Diagnosis not present

## 2021-02-05 DIAGNOSIS — B9562 Methicillin resistant Staphylococcus aureus infection as the cause of diseases classified elsewhere: Secondary | ICD-10-CM | POA: Diagnosis not present

## 2021-02-06 DIAGNOSIS — B9562 Methicillin resistant Staphylococcus aureus infection as the cause of diseases classified elsewhere: Secondary | ICD-10-CM | POA: Diagnosis not present

## 2021-02-06 DIAGNOSIS — R7881 Bacteremia: Secondary | ICD-10-CM | POA: Diagnosis not present

## 2021-02-07 DIAGNOSIS — R7881 Bacteremia: Secondary | ICD-10-CM | POA: Diagnosis not present

## 2021-02-07 DIAGNOSIS — B9562 Methicillin resistant Staphylococcus aureus infection as the cause of diseases classified elsewhere: Secondary | ICD-10-CM | POA: Diagnosis not present

## 2021-02-08 DIAGNOSIS — R7881 Bacteremia: Secondary | ICD-10-CM | POA: Diagnosis not present

## 2021-02-08 DIAGNOSIS — B9562 Methicillin resistant Staphylococcus aureus infection as the cause of diseases classified elsewhere: Secondary | ICD-10-CM | POA: Diagnosis not present

## 2021-02-09 DIAGNOSIS — R7881 Bacteremia: Secondary | ICD-10-CM | POA: Diagnosis not present

## 2021-02-09 DIAGNOSIS — I5042 Chronic combined systolic (congestive) and diastolic (congestive) heart failure: Secondary | ICD-10-CM | POA: Diagnosis not present

## 2021-02-09 DIAGNOSIS — B9562 Methicillin resistant Staphylococcus aureus infection as the cause of diseases classified elsewhere: Secondary | ICD-10-CM | POA: Diagnosis not present

## 2021-02-10 DIAGNOSIS — B9562 Methicillin resistant Staphylococcus aureus infection as the cause of diseases classified elsewhere: Secondary | ICD-10-CM | POA: Diagnosis not present

## 2021-02-10 DIAGNOSIS — R7881 Bacteremia: Secondary | ICD-10-CM | POA: Diagnosis not present

## 2021-02-11 DIAGNOSIS — B9562 Methicillin resistant Staphylococcus aureus infection as the cause of diseases classified elsewhere: Secondary | ICD-10-CM | POA: Diagnosis not present

## 2021-02-11 DIAGNOSIS — R7881 Bacteremia: Secondary | ICD-10-CM | POA: Diagnosis not present

## 2021-02-12 DIAGNOSIS — R7881 Bacteremia: Secondary | ICD-10-CM | POA: Diagnosis not present

## 2021-02-12 DIAGNOSIS — B9562 Methicillin resistant Staphylococcus aureus infection as the cause of diseases classified elsewhere: Secondary | ICD-10-CM | POA: Diagnosis not present

## 2021-02-13 DIAGNOSIS — R7881 Bacteremia: Secondary | ICD-10-CM | POA: Diagnosis not present

## 2021-02-13 DIAGNOSIS — B9562 Methicillin resistant Staphylococcus aureus infection as the cause of diseases classified elsewhere: Secondary | ICD-10-CM | POA: Diagnosis not present

## 2021-02-14 DIAGNOSIS — R7881 Bacteremia: Secondary | ICD-10-CM | POA: Diagnosis not present

## 2021-02-14 DIAGNOSIS — B9562 Methicillin resistant Staphylococcus aureus infection as the cause of diseases classified elsewhere: Secondary | ICD-10-CM | POA: Diagnosis not present

## 2021-02-15 DIAGNOSIS — R634 Abnormal weight loss: Secondary | ICD-10-CM | POA: Diagnosis not present

## 2021-02-15 DIAGNOSIS — R2689 Other abnormalities of gait and mobility: Secondary | ICD-10-CM | POA: Diagnosis not present

## 2021-02-15 DIAGNOSIS — R7881 Bacteremia: Secondary | ICD-10-CM | POA: Diagnosis not present

## 2021-02-15 DIAGNOSIS — Z7409 Other reduced mobility: Secondary | ICD-10-CM | POA: Diagnosis not present

## 2021-02-15 DIAGNOSIS — R2681 Unsteadiness on feet: Secondary | ICD-10-CM | POA: Diagnosis not present

## 2021-02-15 DIAGNOSIS — R630 Anorexia: Secondary | ICD-10-CM | POA: Diagnosis not present

## 2021-02-15 DIAGNOSIS — B9562 Methicillin resistant Staphylococcus aureus infection as the cause of diseases classified elsewhere: Secondary | ICD-10-CM | POA: Diagnosis not present

## 2021-02-16 DIAGNOSIS — R7881 Bacteremia: Secondary | ICD-10-CM | POA: Diagnosis not present

## 2021-02-16 DIAGNOSIS — B9562 Methicillin resistant Staphylococcus aureus infection as the cause of diseases classified elsewhere: Secondary | ICD-10-CM | POA: Diagnosis not present

## 2021-02-20 DIAGNOSIS — Z950 Presence of cardiac pacemaker: Secondary | ICD-10-CM | POA: Diagnosis not present

## 2021-02-20 DIAGNOSIS — I480 Paroxysmal atrial fibrillation: Secondary | ICD-10-CM | POA: Diagnosis not present

## 2021-02-20 DIAGNOSIS — E7800 Pure hypercholesterolemia, unspecified: Secondary | ICD-10-CM | POA: Diagnosis not present

## 2021-02-20 DIAGNOSIS — Z952 Presence of prosthetic heart valve: Secondary | ICD-10-CM | POA: Diagnosis not present

## 2021-02-20 DIAGNOSIS — Z9889 Other specified postprocedural states: Secondary | ICD-10-CM | POA: Diagnosis not present

## 2021-02-20 DIAGNOSIS — R7881 Bacteremia: Secondary | ICD-10-CM | POA: Diagnosis not present

## 2021-02-20 DIAGNOSIS — J90 Pleural effusion, not elsewhere classified: Secondary | ICD-10-CM | POA: Diagnosis not present

## 2021-02-20 DIAGNOSIS — I255 Ischemic cardiomyopathy: Secondary | ICD-10-CM | POA: Diagnosis not present

## 2021-02-20 DIAGNOSIS — I2510 Atherosclerotic heart disease of native coronary artery without angina pectoris: Secondary | ICD-10-CM | POA: Diagnosis not present

## 2021-02-20 DIAGNOSIS — Z951 Presence of aortocoronary bypass graft: Secondary | ICD-10-CM | POA: Diagnosis not present

## 2021-02-20 DIAGNOSIS — D649 Anemia, unspecified: Secondary | ICD-10-CM | POA: Diagnosis not present

## 2021-02-20 DIAGNOSIS — E119 Type 2 diabetes mellitus without complications: Secondary | ICD-10-CM | POA: Diagnosis not present

## 2021-02-20 DIAGNOSIS — B9562 Methicillin resistant Staphylococcus aureus infection as the cause of diseases classified elsewhere: Secondary | ICD-10-CM | POA: Diagnosis not present

## 2021-02-20 DIAGNOSIS — I5042 Chronic combined systolic (congestive) and diastolic (congestive) heart failure: Secondary | ICD-10-CM | POA: Diagnosis not present

## 2021-02-21 DIAGNOSIS — R7881 Bacteremia: Secondary | ICD-10-CM | POA: Diagnosis not present

## 2021-02-21 DIAGNOSIS — B9562 Methicillin resistant Staphylococcus aureus infection as the cause of diseases classified elsewhere: Secondary | ICD-10-CM | POA: Diagnosis not present

## 2021-02-22 DIAGNOSIS — Z951 Presence of aortocoronary bypass graft: Secondary | ICD-10-CM | POA: Diagnosis not present

## 2021-02-22 DIAGNOSIS — E1151 Type 2 diabetes mellitus with diabetic peripheral angiopathy without gangrene: Secondary | ICD-10-CM | POA: Diagnosis not present

## 2021-02-22 DIAGNOSIS — Z7982 Long term (current) use of aspirin: Secondary | ICD-10-CM | POA: Diagnosis not present

## 2021-02-22 DIAGNOSIS — G309 Alzheimer's disease, unspecified: Secondary | ICD-10-CM | POA: Diagnosis not present

## 2021-02-22 DIAGNOSIS — F0280 Dementia in other diseases classified elsewhere without behavioral disturbance: Secondary | ICD-10-CM | POA: Diagnosis not present

## 2021-02-22 DIAGNOSIS — E222 Syndrome of inappropriate secretion of antidiuretic hormone: Secondary | ICD-10-CM | POA: Diagnosis not present

## 2021-02-22 DIAGNOSIS — Z9181 History of falling: Secondary | ICD-10-CM | POA: Diagnosis not present

## 2021-02-22 DIAGNOSIS — I340 Nonrheumatic mitral (valve) insufficiency: Secondary | ICD-10-CM | POA: Diagnosis not present

## 2021-02-22 DIAGNOSIS — I5042 Chronic combined systolic (congestive) and diastolic (congestive) heart failure: Secondary | ICD-10-CM | POA: Diagnosis not present

## 2021-02-22 DIAGNOSIS — I2510 Atherosclerotic heart disease of native coronary artery without angina pectoris: Secondary | ICD-10-CM | POA: Diagnosis not present

## 2021-02-22 DIAGNOSIS — E0781 Sick-euthyroid syndrome: Secondary | ICD-10-CM | POA: Diagnosis not present

## 2021-02-22 DIAGNOSIS — Z87891 Personal history of nicotine dependence: Secondary | ICD-10-CM | POA: Diagnosis not present

## 2021-02-22 DIAGNOSIS — I447 Left bundle-branch block, unspecified: Secondary | ICD-10-CM | POA: Diagnosis not present

## 2021-02-22 DIAGNOSIS — Z950 Presence of cardiac pacemaker: Secondary | ICD-10-CM | POA: Diagnosis not present

## 2021-02-22 DIAGNOSIS — E785 Hyperlipidemia, unspecified: Secondary | ICD-10-CM | POA: Diagnosis not present

## 2021-02-22 DIAGNOSIS — Z952 Presence of prosthetic heart valve: Secondary | ICD-10-CM | POA: Diagnosis not present

## 2021-02-22 DIAGNOSIS — I110 Hypertensive heart disease with heart failure: Secondary | ICD-10-CM | POA: Diagnosis not present

## 2021-02-22 DIAGNOSIS — J432 Centrilobular emphysema: Secondary | ICD-10-CM | POA: Diagnosis not present

## 2021-02-23 DIAGNOSIS — R7881 Bacteremia: Secondary | ICD-10-CM | POA: Diagnosis not present

## 2021-02-23 DIAGNOSIS — B9562 Methicillin resistant Staphylococcus aureus infection as the cause of diseases classified elsewhere: Secondary | ICD-10-CM | POA: Diagnosis not present

## 2021-02-24 DIAGNOSIS — R7881 Bacteremia: Secondary | ICD-10-CM | POA: Diagnosis not present

## 2021-02-24 DIAGNOSIS — B9562 Methicillin resistant Staphylococcus aureus infection as the cause of diseases classified elsewhere: Secondary | ICD-10-CM | POA: Diagnosis not present

## 2021-02-25 DIAGNOSIS — R7881 Bacteremia: Secondary | ICD-10-CM | POA: Diagnosis not present

## 2021-02-25 DIAGNOSIS — B9562 Methicillin resistant Staphylococcus aureus infection as the cause of diseases classified elsewhere: Secondary | ICD-10-CM | POA: Diagnosis not present

## 2021-02-26 DIAGNOSIS — R7881 Bacteremia: Secondary | ICD-10-CM | POA: Diagnosis not present

## 2021-02-26 DIAGNOSIS — B9562 Methicillin resistant Staphylococcus aureus infection as the cause of diseases classified elsewhere: Secondary | ICD-10-CM | POA: Diagnosis not present

## 2021-02-27 DIAGNOSIS — R7881 Bacteremia: Secondary | ICD-10-CM | POA: Diagnosis not present

## 2021-02-27 DIAGNOSIS — B9562 Methicillin resistant Staphylococcus aureus infection as the cause of diseases classified elsewhere: Secondary | ICD-10-CM | POA: Diagnosis not present

## 2021-02-28 DIAGNOSIS — J432 Centrilobular emphysema: Secondary | ICD-10-CM | POA: Diagnosis not present

## 2021-02-28 DIAGNOSIS — Z7982 Long term (current) use of aspirin: Secondary | ICD-10-CM | POA: Diagnosis not present

## 2021-02-28 DIAGNOSIS — I110 Hypertensive heart disease with heart failure: Secondary | ICD-10-CM | POA: Diagnosis not present

## 2021-02-28 DIAGNOSIS — Z9181 History of falling: Secondary | ICD-10-CM | POA: Diagnosis not present

## 2021-02-28 DIAGNOSIS — F0280 Dementia in other diseases classified elsewhere without behavioral disturbance: Secondary | ICD-10-CM | POA: Diagnosis not present

## 2021-02-28 DIAGNOSIS — E1151 Type 2 diabetes mellitus with diabetic peripheral angiopathy without gangrene: Secondary | ICD-10-CM | POA: Diagnosis not present

## 2021-02-28 DIAGNOSIS — I340 Nonrheumatic mitral (valve) insufficiency: Secondary | ICD-10-CM | POA: Diagnosis not present

## 2021-02-28 DIAGNOSIS — E222 Syndrome of inappropriate secretion of antidiuretic hormone: Secondary | ICD-10-CM | POA: Diagnosis not present

## 2021-02-28 DIAGNOSIS — Z952 Presence of prosthetic heart valve: Secondary | ICD-10-CM | POA: Diagnosis not present

## 2021-02-28 DIAGNOSIS — I447 Left bundle-branch block, unspecified: Secondary | ICD-10-CM | POA: Diagnosis not present

## 2021-02-28 DIAGNOSIS — Z951 Presence of aortocoronary bypass graft: Secondary | ICD-10-CM | POA: Diagnosis not present

## 2021-02-28 DIAGNOSIS — B9562 Methicillin resistant Staphylococcus aureus infection as the cause of diseases classified elsewhere: Secondary | ICD-10-CM | POA: Diagnosis not present

## 2021-02-28 DIAGNOSIS — I2510 Atherosclerotic heart disease of native coronary artery without angina pectoris: Secondary | ICD-10-CM | POA: Diagnosis not present

## 2021-02-28 DIAGNOSIS — E0781 Sick-euthyroid syndrome: Secondary | ICD-10-CM | POA: Diagnosis not present

## 2021-02-28 DIAGNOSIS — I5042 Chronic combined systolic (congestive) and diastolic (congestive) heart failure: Secondary | ICD-10-CM | POA: Diagnosis not present

## 2021-02-28 DIAGNOSIS — G309 Alzheimer's disease, unspecified: Secondary | ICD-10-CM | POA: Diagnosis not present

## 2021-02-28 DIAGNOSIS — Z87891 Personal history of nicotine dependence: Secondary | ICD-10-CM | POA: Diagnosis not present

## 2021-02-28 DIAGNOSIS — R7881 Bacteremia: Secondary | ICD-10-CM | POA: Diagnosis not present

## 2021-02-28 DIAGNOSIS — E785 Hyperlipidemia, unspecified: Secondary | ICD-10-CM | POA: Diagnosis not present

## 2021-02-28 DIAGNOSIS — Z950 Presence of cardiac pacemaker: Secondary | ICD-10-CM | POA: Diagnosis not present

## 2021-03-01 DIAGNOSIS — B9562 Methicillin resistant Staphylococcus aureus infection as the cause of diseases classified elsewhere: Secondary | ICD-10-CM | POA: Diagnosis not present

## 2021-03-01 DIAGNOSIS — Z7982 Long term (current) use of aspirin: Secondary | ICD-10-CM | POA: Diagnosis not present

## 2021-03-01 DIAGNOSIS — I2510 Atherosclerotic heart disease of native coronary artery without angina pectoris: Secondary | ICD-10-CM | POA: Diagnosis not present

## 2021-03-01 DIAGNOSIS — Z952 Presence of prosthetic heart valve: Secondary | ICD-10-CM | POA: Diagnosis not present

## 2021-03-01 DIAGNOSIS — I447 Left bundle-branch block, unspecified: Secondary | ICD-10-CM | POA: Diagnosis not present

## 2021-03-01 DIAGNOSIS — E222 Syndrome of inappropriate secretion of antidiuretic hormone: Secondary | ICD-10-CM | POA: Diagnosis not present

## 2021-03-01 DIAGNOSIS — F0280 Dementia in other diseases classified elsewhere without behavioral disturbance: Secondary | ICD-10-CM | POA: Diagnosis not present

## 2021-03-01 DIAGNOSIS — Z87891 Personal history of nicotine dependence: Secondary | ICD-10-CM | POA: Diagnosis not present

## 2021-03-01 DIAGNOSIS — Z9181 History of falling: Secondary | ICD-10-CM | POA: Diagnosis not present

## 2021-03-01 DIAGNOSIS — Z951 Presence of aortocoronary bypass graft: Secondary | ICD-10-CM | POA: Diagnosis not present

## 2021-03-01 DIAGNOSIS — E1151 Type 2 diabetes mellitus with diabetic peripheral angiopathy without gangrene: Secondary | ICD-10-CM | POA: Diagnosis not present

## 2021-03-01 DIAGNOSIS — J432 Centrilobular emphysema: Secondary | ICD-10-CM | POA: Diagnosis not present

## 2021-03-01 DIAGNOSIS — I5042 Chronic combined systolic (congestive) and diastolic (congestive) heart failure: Secondary | ICD-10-CM | POA: Diagnosis not present

## 2021-03-01 DIAGNOSIS — R7881 Bacteremia: Secondary | ICD-10-CM | POA: Diagnosis not present

## 2021-03-01 DIAGNOSIS — G309 Alzheimer's disease, unspecified: Secondary | ICD-10-CM | POA: Diagnosis not present

## 2021-03-01 DIAGNOSIS — E0781 Sick-euthyroid syndrome: Secondary | ICD-10-CM | POA: Diagnosis not present

## 2021-03-01 DIAGNOSIS — I340 Nonrheumatic mitral (valve) insufficiency: Secondary | ICD-10-CM | POA: Diagnosis not present

## 2021-03-01 DIAGNOSIS — I110 Hypertensive heart disease with heart failure: Secondary | ICD-10-CM | POA: Diagnosis not present

## 2021-03-01 DIAGNOSIS — E785 Hyperlipidemia, unspecified: Secondary | ICD-10-CM | POA: Diagnosis not present

## 2021-03-01 DIAGNOSIS — Z950 Presence of cardiac pacemaker: Secondary | ICD-10-CM | POA: Diagnosis not present

## 2021-03-02 DIAGNOSIS — B9562 Methicillin resistant Staphylococcus aureus infection as the cause of diseases classified elsewhere: Secondary | ICD-10-CM | POA: Diagnosis not present

## 2021-03-02 DIAGNOSIS — F0280 Dementia in other diseases classified elsewhere without behavioral disturbance: Secondary | ICD-10-CM | POA: Diagnosis not present

## 2021-03-02 DIAGNOSIS — E1151 Type 2 diabetes mellitus with diabetic peripheral angiopathy without gangrene: Secondary | ICD-10-CM | POA: Diagnosis not present

## 2021-03-02 DIAGNOSIS — I5042 Chronic combined systolic (congestive) and diastolic (congestive) heart failure: Secondary | ICD-10-CM | POA: Diagnosis not present

## 2021-03-02 DIAGNOSIS — I2510 Atherosclerotic heart disease of native coronary artery without angina pectoris: Secondary | ICD-10-CM | POA: Diagnosis not present

## 2021-03-02 DIAGNOSIS — J432 Centrilobular emphysema: Secondary | ICD-10-CM | POA: Diagnosis not present

## 2021-03-02 DIAGNOSIS — R7881 Bacteremia: Secondary | ICD-10-CM | POA: Diagnosis not present

## 2021-03-02 DIAGNOSIS — I110 Hypertensive heart disease with heart failure: Secondary | ICD-10-CM | POA: Diagnosis not present

## 2021-03-02 DIAGNOSIS — G309 Alzheimer's disease, unspecified: Secondary | ICD-10-CM | POA: Diagnosis not present

## 2021-03-02 DIAGNOSIS — A419 Sepsis, unspecified organism: Secondary | ICD-10-CM | POA: Diagnosis not present

## 2021-03-03 ENCOUNTER — Inpatient Hospital Stay (HOSPITAL_COMMUNITY): Payer: PPO

## 2021-03-03 ENCOUNTER — Emergency Department (HOSPITAL_COMMUNITY): Payer: PPO

## 2021-03-03 ENCOUNTER — Other Ambulatory Visit: Payer: Self-pay

## 2021-03-03 ENCOUNTER — Inpatient Hospital Stay (HOSPITAL_COMMUNITY)
Admission: EM | Admit: 2021-03-03 | Discharge: 2021-03-05 | DRG: 069 | Disposition: A | Discharge status: Other Acute Inpatient Hospital | Payer: PPO | Attending: Family Medicine | Admitting: Family Medicine

## 2021-03-03 ENCOUNTER — Encounter (HOSPITAL_COMMUNITY): Payer: Self-pay | Admitting: *Deleted

## 2021-03-03 ENCOUNTER — Emergency Department: Admission: EM | Admit: 2021-03-03 | Disposition: A | Discharge status: Other Acute Inpatient Hospital | Payer: PPO

## 2021-03-03 DIAGNOSIS — Z951 Presence of aortocoronary bypass graft: Secondary | ICD-10-CM | POA: Diagnosis not present

## 2021-03-03 DIAGNOSIS — R7881 Bacteremia: Secondary | ICD-10-CM | POA: Diagnosis not present

## 2021-03-03 DIAGNOSIS — Z86010 Personal history of colonic polyps: Secondary | ICD-10-CM

## 2021-03-03 DIAGNOSIS — B9562 Methicillin resistant Staphylococcus aureus infection as the cause of diseases classified elsewhere: Secondary | ICD-10-CM

## 2021-03-03 DIAGNOSIS — J439 Emphysema, unspecified: Secondary | ICD-10-CM | POA: Diagnosis not present

## 2021-03-03 DIAGNOSIS — F0390 Unspecified dementia without behavioral disturbance: Secondary | ICD-10-CM | POA: Diagnosis not present

## 2021-03-03 DIAGNOSIS — I340 Nonrheumatic mitral (valve) insufficiency: Secondary | ICD-10-CM | POA: Diagnosis not present

## 2021-03-03 DIAGNOSIS — Z87891 Personal history of nicotine dependence: Secondary | ICD-10-CM

## 2021-03-03 DIAGNOSIS — Z950 Presence of cardiac pacemaker: Secondary | ICD-10-CM | POA: Diagnosis not present

## 2021-03-03 DIAGNOSIS — I771 Stricture of artery: Secondary | ICD-10-CM | POA: Diagnosis not present

## 2021-03-03 DIAGNOSIS — I4892 Unspecified atrial flutter: Secondary | ICD-10-CM | POA: Diagnosis present

## 2021-03-03 DIAGNOSIS — I255 Ischemic cardiomyopathy: Secondary | ICD-10-CM | POA: Diagnosis present

## 2021-03-03 DIAGNOSIS — E785 Hyperlipidemia, unspecified: Secondary | ICD-10-CM | POA: Diagnosis not present

## 2021-03-03 DIAGNOSIS — I639 Cerebral infarction, unspecified: Principal | ICD-10-CM

## 2021-03-03 DIAGNOSIS — R531 Weakness: Secondary | ICD-10-CM | POA: Diagnosis not present

## 2021-03-03 DIAGNOSIS — E1151 Type 2 diabetes mellitus with diabetic peripheral angiopathy without gangrene: Secondary | ICD-10-CM | POA: Diagnosis present

## 2021-03-03 DIAGNOSIS — Z7901 Long term (current) use of anticoagulants: Secondary | ICD-10-CM

## 2021-03-03 DIAGNOSIS — G8321 Monoplegia of upper limb affecting right dominant side: Secondary | ICD-10-CM | POA: Diagnosis present

## 2021-03-03 DIAGNOSIS — Z20822 Contact with and (suspected) exposure to covid-19: Secondary | ICD-10-CM | POA: Diagnosis not present

## 2021-03-03 DIAGNOSIS — D509 Iron deficiency anemia, unspecified: Secondary | ICD-10-CM | POA: Diagnosis not present

## 2021-03-03 DIAGNOSIS — R9431 Abnormal electrocardiogram [ECG] [EKG]: Secondary | ICD-10-CM | POA: Diagnosis not present

## 2021-03-03 DIAGNOSIS — Z825 Family history of asthma and other chronic lower respiratory diseases: Secondary | ICD-10-CM

## 2021-03-03 DIAGNOSIS — E43 Unspecified severe protein-calorie malnutrition: Secondary | ICD-10-CM | POA: Diagnosis not present

## 2021-03-03 DIAGNOSIS — G459 Transient cerebral ischemic attack, unspecified: Secondary | ICD-10-CM | POA: Diagnosis not present

## 2021-03-03 DIAGNOSIS — I5042 Chronic combined systolic (congestive) and diastolic (congestive) heart failure: Secondary | ICD-10-CM | POA: Diagnosis not present

## 2021-03-03 DIAGNOSIS — R29810 Facial weakness: Secondary | ICD-10-CM | POA: Diagnosis present

## 2021-03-03 DIAGNOSIS — T826XXA Infection and inflammatory reaction due to cardiac valve prosthesis, initial encounter: Secondary | ICD-10-CM | POA: Diagnosis not present

## 2021-03-03 DIAGNOSIS — A4102 Sepsis due to Methicillin resistant Staphylococcus aureus: Secondary | ICD-10-CM | POA: Diagnosis present

## 2021-03-03 DIAGNOSIS — F419 Anxiety disorder, unspecified: Secondary | ICD-10-CM | POA: Diagnosis present

## 2021-03-03 DIAGNOSIS — I6389 Other cerebral infarction: Secondary | ICD-10-CM | POA: Diagnosis not present

## 2021-03-03 DIAGNOSIS — I509 Heart failure, unspecified: Secondary | ICD-10-CM

## 2021-03-03 DIAGNOSIS — I517 Cardiomegaly: Secondary | ICD-10-CM | POA: Diagnosis not present

## 2021-03-03 DIAGNOSIS — R404 Transient alteration of awareness: Secondary | ICD-10-CM | POA: Diagnosis not present

## 2021-03-03 DIAGNOSIS — R471 Dysarthria and anarthria: Secondary | ICD-10-CM | POA: Diagnosis present

## 2021-03-03 DIAGNOSIS — I442 Atrioventricular block, complete: Secondary | ICD-10-CM | POA: Diagnosis not present

## 2021-03-03 DIAGNOSIS — Z515 Encounter for palliative care: Secondary | ICD-10-CM | POA: Diagnosis not present

## 2021-03-03 DIAGNOSIS — I999 Unspecified disorder of circulatory system: Secondary | ICD-10-CM | POA: Diagnosis not present

## 2021-03-03 DIAGNOSIS — Z9109 Other allergy status, other than to drugs and biological substances: Secondary | ICD-10-CM

## 2021-03-03 DIAGNOSIS — Z90710 Acquired absence of both cervix and uterus: Secondary | ICD-10-CM

## 2021-03-03 DIAGNOSIS — Z8249 Family history of ischemic heart disease and other diseases of the circulatory system: Secondary | ICD-10-CM

## 2021-03-03 DIAGNOSIS — J432 Centrilobular emphysema: Secondary | ICD-10-CM | POA: Diagnosis present

## 2021-03-03 DIAGNOSIS — Z952 Presence of prosthetic heart valve: Secondary | ICD-10-CM | POA: Diagnosis not present

## 2021-03-03 DIAGNOSIS — I878 Other specified disorders of veins: Secondary | ICD-10-CM | POA: Diagnosis present

## 2021-03-03 DIAGNOSIS — R64 Cachexia: Secondary | ICD-10-CM | POA: Diagnosis not present

## 2021-03-03 DIAGNOSIS — E876 Hypokalemia: Secondary | ICD-10-CM | POA: Diagnosis not present

## 2021-03-03 DIAGNOSIS — Z7982 Long term (current) use of aspirin: Secondary | ICD-10-CM

## 2021-03-03 DIAGNOSIS — I330 Acute and subacute infective endocarditis: Secondary | ICD-10-CM | POA: Diagnosis not present

## 2021-03-03 DIAGNOSIS — E119 Type 2 diabetes mellitus without complications: Secondary | ICD-10-CM | POA: Diagnosis not present

## 2021-03-03 DIAGNOSIS — I69315 Cognitive social or emotional deficit following cerebral infarction: Secondary | ICD-10-CM | POA: Diagnosis not present

## 2021-03-03 DIAGNOSIS — I700 Atherosclerosis of aorta: Secondary | ICD-10-CM | POA: Diagnosis not present

## 2021-03-03 DIAGNOSIS — I5023 Acute on chronic systolic (congestive) heart failure: Secondary | ICD-10-CM | POA: Diagnosis not present

## 2021-03-03 DIAGNOSIS — E1165 Type 2 diabetes mellitus with hyperglycemia: Secondary | ICD-10-CM | POA: Diagnosis not present

## 2021-03-03 DIAGNOSIS — Z66 Do not resuscitate: Secondary | ICD-10-CM | POA: Diagnosis not present

## 2021-03-03 DIAGNOSIS — I110 Hypertensive heart disease with heart failure: Secondary | ICD-10-CM | POA: Diagnosis present

## 2021-03-03 DIAGNOSIS — F05 Delirium due to known physiological condition: Secondary | ICD-10-CM | POA: Diagnosis not present

## 2021-03-03 DIAGNOSIS — J90 Pleural effusion, not elsewhere classified: Secondary | ICD-10-CM | POA: Diagnosis not present

## 2021-03-03 DIAGNOSIS — Z79899 Other long term (current) drug therapy: Secondary | ICD-10-CM

## 2021-03-03 DIAGNOSIS — F4321 Adjustment disorder with depressed mood: Secondary | ICD-10-CM | POA: Diagnosis not present

## 2021-03-03 DIAGNOSIS — R6521 Severe sepsis with septic shock: Secondary | ICD-10-CM | POA: Diagnosis not present

## 2021-03-03 DIAGNOSIS — I4819 Other persistent atrial fibrillation: Secondary | ICD-10-CM | POA: Diagnosis not present

## 2021-03-03 DIAGNOSIS — R32 Unspecified urinary incontinence: Secondary | ICD-10-CM | POA: Diagnosis not present

## 2021-03-03 DIAGNOSIS — R0902 Hypoxemia: Secondary | ICD-10-CM | POA: Diagnosis not present

## 2021-03-03 DIAGNOSIS — I7389 Other specified peripheral vascular diseases: Secondary | ICD-10-CM | POA: Diagnosis not present

## 2021-03-03 DIAGNOSIS — Z8679 Personal history of other diseases of the circulatory system: Secondary | ICD-10-CM | POA: Diagnosis not present

## 2021-03-03 DIAGNOSIS — Z7984 Long term (current) use of oral hypoglycemic drugs: Secondary | ICD-10-CM

## 2021-03-03 DIAGNOSIS — I480 Paroxysmal atrial fibrillation: Secondary | ICD-10-CM | POA: Diagnosis present

## 2021-03-03 DIAGNOSIS — I6523 Occlusion and stenosis of bilateral carotid arteries: Secondary | ICD-10-CM | POA: Diagnosis not present

## 2021-03-03 DIAGNOSIS — Z681 Body mass index (BMI) 19 or less, adult: Secondary | ICD-10-CM | POA: Diagnosis not present

## 2021-03-03 DIAGNOSIS — I708 Atherosclerosis of other arteries: Secondary | ICD-10-CM | POA: Diagnosis not present

## 2021-03-03 DIAGNOSIS — Z885 Allergy status to narcotic agent status: Secondary | ICD-10-CM

## 2021-03-03 DIAGNOSIS — I447 Left bundle-branch block, unspecified: Secondary | ICD-10-CM | POA: Diagnosis not present

## 2021-03-03 DIAGNOSIS — I2510 Atherosclerotic heart disease of native coronary artery without angina pectoris: Secondary | ICD-10-CM | POA: Diagnosis present

## 2021-03-03 DIAGNOSIS — I5033 Acute on chronic diastolic (congestive) heart failure: Secondary | ICD-10-CM | POA: Diagnosis not present

## 2021-03-03 DIAGNOSIS — J8410 Pulmonary fibrosis, unspecified: Secondary | ICD-10-CM | POA: Diagnosis present

## 2021-03-03 DIAGNOSIS — I4891 Unspecified atrial fibrillation: Secondary | ICD-10-CM | POA: Diagnosis not present

## 2021-03-03 DIAGNOSIS — L89150 Pressure ulcer of sacral region, unstageable: Secondary | ICD-10-CM | POA: Diagnosis not present

## 2021-03-03 DIAGNOSIS — R29818 Other symptoms and signs involving the nervous system: Secondary | ICD-10-CM | POA: Diagnosis not present

## 2021-03-03 LAB — COMPREHENSIVE METABOLIC PANEL
ALT: 27 U/L (ref 0–44)
AST: 32 U/L (ref 15–41)
Albumin: 3.4 g/dL — ABNORMAL LOW (ref 3.5–5.0)
Alkaline Phosphatase: 79 U/L (ref 38–126)
Anion gap: 8 (ref 5–15)
BUN: 21 mg/dL (ref 8–23)
CO2: 25 mmol/L (ref 22–32)
Calcium: 10.2 mg/dL (ref 8.9–10.3)
Chloride: 102 mmol/L (ref 98–111)
Creatinine, Ser: 0.78 mg/dL (ref 0.44–1.00)
GFR, Estimated: >60 mL/min (ref >60)
Glucose, Bld: 209 mg/dL — ABNORMAL HIGH (ref 70–99)
Potassium: 4.3 mmol/L (ref 3.5–5.1)
Sodium: 135 mmol/L (ref 135–145)
Total Bilirubin: 0.6 mg/dL (ref 0.3–1.2)
Total Protein: 6.2 g/dL — ABNORMAL LOW (ref 6.5–8.1)

## 2021-03-03 LAB — DIFFERENTIAL
Abs Immature Granulocytes: 0.03 10*3/uL (ref 0.00–0.07)
Basophils Absolute: 0 10*3/uL (ref 0.0–0.1)
Basophils Relative: 0 %
Eosinophils Absolute: 0.1 10*3/uL (ref 0.0–0.5)
Eosinophils Relative: 1 %
Immature Granulocytes: 0 %
Lymphocytes Relative: 10 %
Lymphs Abs: 0.8 10*3/uL (ref 0.7–4.0)
Monocytes Absolute: 0.6 10*3/uL (ref 0.1–1.0)
Monocytes Relative: 8 %
Neutro Abs: 6.1 10*3/uL (ref 1.7–7.7)
Neutrophils Relative %: 81 %

## 2021-03-03 LAB — CBC
HCT: 36.5 % (ref 36.0–46.0)
Hemoglobin: 10.9 g/dL — ABNORMAL LOW (ref 12.0–15.0)
MCH: 25.9 pg — ABNORMAL LOW (ref 26.0–34.0)
MCHC: 29.9 g/dL — ABNORMAL LOW (ref 30.0–36.0)
MCV: 86.7 fL (ref 80.0–100.0)
Platelets: 248 10*3/uL (ref 150–400)
RBC: 4.21 MIL/uL (ref 3.87–5.11)
RDW: 20 % — ABNORMAL HIGH (ref 11.5–15.5)
WBC: 7.7 10*3/uL (ref 4.0–10.5)
nRBC: 0 % (ref 0.0–0.2)

## 2021-03-03 LAB — CBG MONITORING, ED
Glucose-Capillary: 181 mg/dL — ABNORMAL HIGH (ref 70–99)
Glucose-Capillary: 122 mg/dL — ABNORMAL HIGH (ref 70–99)

## 2021-03-03 LAB — I-STAT CHEM 8, ED
BUN: 25 mg/dL — ABNORMAL HIGH (ref 8–23)
Calcium, Ion: 1.3 mmol/L (ref 1.15–1.40)
Chloride: 104 mmol/L (ref 98–111)
Creatinine, Ser: 0.7 mg/dL (ref 0.44–1.00)
Glucose, Bld: 199 mg/dL — ABNORMAL HIGH (ref 70–99)
HCT: 33 % — ABNORMAL LOW (ref 36.0–46.0)
Hemoglobin: 11.2 g/dL — ABNORMAL LOW (ref 12.0–15.0)
Potassium: 4.3 mmol/L (ref 3.5–5.1)
Sodium: 136 mmol/L (ref 135–145)
TCO2: 23 mmol/L (ref 22–32)

## 2021-03-03 LAB — ETHANOL: Alcohol, Ethyl (B): <10 mg/dL (ref <10)

## 2021-03-03 LAB — RESP PANEL BY RT-PCR (FLU A&B, COVID) ARPGX2
Influenza A by PCR: NEGATIVE
Influenza B by PCR: NEGATIVE
SARS Coronavirus 2 by RT PCR: NEGATIVE

## 2021-03-03 LAB — PROTIME-INR
INR: 1.5 — ABNORMAL HIGH (ref 0.8–1.2)
Prothrombin Time: 17.7 seconds — ABNORMAL HIGH (ref 11.4–15.2)

## 2021-03-03 LAB — MAGNESIUM: Magnesium: 2.1 mg/dL (ref 1.7–2.4)

## 2021-03-03 LAB — TSH: TSH: 4.578 u[IU]/mL — ABNORMAL HIGH (ref 0.350–4.500)

## 2021-03-03 LAB — GLUCOSE, CAPILLARY: Glucose-Capillary: 89 mg/dL (ref 70–99)

## 2021-03-03 LAB — VANCOMYCIN, TROUGH: Vancomycin Tr: 19 ug/mL (ref 15–20)

## 2021-03-03 LAB — TROPONIN I (HIGH SENSITIVITY)
Troponin I (High Sensitivity): 24 ng/L — ABNORMAL HIGH (ref <18)
Troponin I (High Sensitivity): 23 ng/L — ABNORMAL HIGH (ref <18)

## 2021-03-03 LAB — APTT: aPTT: 27 seconds (ref 24–36)

## 2021-03-03 MED ORDER — ACETAMINOPHEN 325 MG PO TABS: 650.0000 mg | ORAL_TABLET | ORAL | Status: DC | PRN

## 2021-03-03 MED ORDER — MIRTAZAPINE 15 MG PO TABS
15.0000 mg | ORAL_TABLET | Freq: Every day | ORAL | Status: DC
  Administered 2021-03-03 – 2021-03-04 (×2): 15 mg via ORAL
  Filled 2021-03-03 (×3): qty 1

## 2021-03-03 MED ORDER — POTASSIUM CHLORIDE 20 MEQ PO PACK: 20.0000 meq | PACK | ORAL | Status: DC

## 2021-03-03 MED ORDER — SODIUM CHLORIDE 0.9 % IV SOLN: INTRAVENOUS | Status: DC

## 2021-03-03 MED ORDER — SENNOSIDES-DOCUSATE SODIUM 8.6-50 MG PO TABS: 1.0000 | ORAL_TABLET | Freq: Every evening | ORAL | Status: DC | PRN

## 2021-03-03 MED ORDER — CHLORHEXIDINE GLUCONATE CLOTH 2 % EX PADS: 6.0000 | MEDICATED_PAD | Freq: Every day | CUTANEOUS | Status: DC

## 2021-03-03 MED ORDER — AMIODARONE HCL 200 MG PO TABS: 100.0000 mg | ORAL_TABLET | Freq: Every morning | ORAL | Status: DC

## 2021-03-03 MED ORDER — VITAMIN B-12 1000 MCG PO TABS
1000.0000 ug | ORAL_TABLET | Freq: Every day | ORAL | Status: DC
  Administered 2021-03-04: 1000 ug via ORAL
  Filled 2021-03-03: qty 1

## 2021-03-03 MED ORDER — ACETAMINOPHEN 650 MG RE SUPP: 650.0000 mg | RECTAL | Status: DC | PRN

## 2021-03-03 MED ORDER — STROKE: EARLY STAGES OF RECOVERY BOOK
Freq: Once | Status: AC
  Filled 2021-03-03: qty 1

## 2021-03-03 MED ORDER — MAGNESIUM OXIDE 400 (241.3 MG) MG PO TABS
400.0000 mg | ORAL_TABLET | Freq: Two times a day (BID) | ORAL | Status: DC
  Administered 2021-03-03 – 2021-03-04 (×4): 400 mg via ORAL
  Filled 2021-03-03 (×4): qty 1

## 2021-03-03 MED ORDER — ACETAMINOPHEN 160 MG/5ML PO SOLN: 650.0000 mg | ORAL | Status: DC | PRN

## 2021-03-03 MED ORDER — AMIODARONE HCL 200 MG PO TABS
200.0000 mg | ORAL_TABLET | Freq: Every morning | ORAL | Status: DC
  Administered 2021-03-04: 200 mg via ORAL
  Filled 2021-03-03: qty 1

## 2021-03-03 MED ORDER — ATORVASTATIN CALCIUM 40 MG PO TABS
40.0000 mg | ORAL_TABLET | Freq: Every day | ORAL | Status: DC
  Administered 2021-03-03 – 2021-03-04 (×2): 40 mg via ORAL
  Filled 2021-03-03 (×2): qty 1

## 2021-03-03 MED ORDER — INSULIN ASPART 100 UNIT/ML ~~LOC~~ SOLN: 0.0000 [IU] | Freq: Three times a day (TID) | SUBCUTANEOUS | Status: DC

## 2021-03-03 MED ORDER — IOHEXOL 350 MG/ML SOLN
75.0000 mL | Freq: Once | INTRAVENOUS | Status: AC | PRN
  Administered 2021-03-03: 75 mL via INTRAVENOUS

## 2021-03-03 MED ORDER — STROKE: EARLY STAGES OF RECOVERY BOOK
Status: AC
  Filled 2021-03-03: qty 1

## 2021-03-03 MED ORDER — METOPROLOL TARTRATE 25 MG PO TABS: 25.0000 mg | ORAL_TABLET | Freq: Two times a day (BID) | ORAL | Status: DC

## 2021-03-03 MED ORDER — FUROSEMIDE 10 MG/ML IJ SOLN
40.0000 mg | Freq: Once | INTRAMUSCULAR | Status: AC
  Administered 2021-03-03: 40 mg via INTRAVENOUS
  Filled 2021-03-03: qty 4

## 2021-03-03 MED ORDER — METOPROLOL TARTRATE 50 MG PO TABS
50.0000 mg | ORAL_TABLET | Freq: Two times a day (BID) | ORAL | Status: DC
  Administered 2021-03-03 – 2021-03-04 (×3): 50 mg via ORAL
  Filled 2021-03-03 (×3): qty 1

## 2021-03-03 MED ORDER — APIXABAN 5 MG PO TABS
5.0000 mg | ORAL_TABLET | Freq: Two times a day (BID) | ORAL | Status: DC
  Administered 2021-03-03: 5 mg via ORAL
  Filled 2021-03-03: qty 1

## 2021-03-03 MED ORDER — EMPAGLIFLOZIN 10 MG PO TABS
10.0000 mg | ORAL_TABLET | Freq: Every day | ORAL | Status: DC
  Administered 2021-03-04: 10 mg via ORAL
  Filled 2021-03-03 (×2): qty 1

## 2021-03-03 MED ORDER — ASPIRIN EC 81 MG PO TBEC
81.0000 mg | DELAYED_RELEASE_TABLET | Freq: Every morning | ORAL | Status: DC
  Administered 2021-03-04: 81 mg via ORAL
  Filled 2021-03-03: qty 1

## 2021-03-03 MED ORDER — VANCOMYCIN HCL 1000 MG/200ML IV SOLN
1000.0000 mg | Freq: Every day | INTRAVENOUS | Status: DC
  Administered 2021-03-03 – 2021-03-04 (×2): 1000 mg via INTRAVENOUS
  Filled 2021-03-03 (×2): qty 200

## 2021-03-03 MED ORDER — LABETALOL HCL 200 MG PO TABS: 100.0000 mg | ORAL_TABLET | Freq: Three times a day (TID) | ORAL | Status: DC | PRN

## 2021-03-03 MED ORDER — ADULT MULTIVITAMIN W/MINERALS CH
1.0000 | ORAL_TABLET | Freq: Every day | ORAL | Status: DC
  Administered 2021-03-04: 1 via ORAL
  Filled 2021-03-03: qty 1

## 2021-03-03 MED ORDER — ENOXAPARIN SODIUM 30 MG/0.3ML ~~LOC~~ SOLN: 30.0000 mg | SUBCUTANEOUS | Status: DC

## 2021-03-03 MED ORDER — METOPROLOL TARTRATE 5 MG/5ML IV SOLN: 2.5000 mg | Freq: Four times a day (QID) | INTRAVENOUS | Status: DC | PRN

## 2021-03-03 MED ORDER — APIXABAN 5 MG PO TABS: 5.0000 mg | ORAL_TABLET | Freq: Two times a day (BID) | ORAL | Status: DC

## 2021-03-03 MED ORDER — SPIRONOLACTONE 25 MG PO TABS
25.0000 mg | ORAL_TABLET | Freq: Every day | ORAL | Status: DC
  Administered 2021-03-04: 25 mg via ORAL
  Filled 2021-03-03: qty 1

## 2021-03-03 NOTE — H&P (Addendum)
History and Physical    Miranda Manning TGG:[id number] DOB: [birth year] DOA: 03/03/2021  PCP: Maryland Pink, MD (Confirm with patient/family/NH records and if not entered, this has to be entered at [facility] point of entry) Patient coming from: Home  I have personally briefly reviewed patient's old medical records in [place]  Chief Complaint: Feeling ok  HPI: Miranda Manning is a 75 y.o. female with medical history significant of CAD s/p CABG, ischemic cardiomyopathy (LVEF 30-35%), severe mitral regurgitation status post mitral mitral valve replacement, paroxysmal A. fib on Eliquis, third-degree AV block status post pacemaker, MRSA bacteremia on Vancomycin (last day 03/06/21), IIDM,  Presented with TIA symptoms.  This morning around 9:30, daughter noticed that patient had a new left sided facial droop and right arm weakness, and called 911. Upon arrival, her symptoms all resolved.   ED Course: CTA showed significant 70% left proximal subclavian stenosis, 80% right distal ICA stenosis.  Review of Systems: As per HPI otherwise 14 point review of systems negative.    Past Medical History:  Diagnosis Date  . Anxiety   . CAD (coronary artery disease)   . Cataract    only in one eye per pt.  . Cerebrovascular disease   . Colon polyp   . Diverticulosis   . Emphysema lung (HCC)    no COPD per pt! - good lung fx, centrilobular emphysema on CT  . LBBB (left bundle branch block)   . Mitral regurgitation    Severe  . Other and unspecified hyperlipidemia   . Personal history of tobacco use, presenting hazards to health   . Pulmonary fibrosis ([place])    per pt, does not have -  seen on abd CT 2015  . Type II or unspecified type diabetes mellitus without mention of complication, not stated as uncontrolled   . Unspecified essential hypertension   . Vertigo    in past    Past Surgical History:  Procedure Laterality Date  . ABDOMINAL HYSTERECTOMY    . ANTERIOR CRUCIATE LIGAMENT REPAIR      After accident/ right knee  . BREAST BIOPSY Left 2008   benign  . CARDIAC SURGERY    . CAROTID ENDARTERECTOMY     right side/ one stent  . COLONOSCOPY    . PACEMAKER IMPLANT    . [place]   Leg  . VESICOVAGINAL FISTULA CLOSURE W/ TAH  1981     reports that she quit smoking about 42 years ago. Her smoking use included cigarettes. She has a 25.00 pack-year smoking history. She has never used smokeless tobacco. She reports that she does not drink alcohol and does not use drugs.  Allergies  Allergen Reactions  . Wound Dressing Adhesive Other (See Comments)    Patient has VERY thin skin on her LOWER EXTREMITIES, use IV3000 and when removing dressings/bandages, use anti-adhesive spray!  . Codeine Rash    Family History  Problem Relation Age of Onset  . Hypertension Mother   . Rheum arthritis Mother   . Heart attack Father 61  . COPD Father   . Allergies Son   . Heart disease Brother        before age 54  . Breast cancer Paternal Aunt   . Colon cancer Neg Hx   . Stomach cancer Neg Hx      Prior to Admission medications   Medication Sig Start Date End Date Taking? Authorizing Provider  albuterol (VENTOLIN HFA) 108 (90 Base) MCG/ACT inhaler Inhale  2 puffs into the lungs every 6 (six) hours as needed for wheezing or shortness of breath. 09/21/20  Yes Kasa, Maretta Bees, MD  amiodarone (PACERONE) 200 MG tablet Take 100 mg by mouth in the morning.   Yes [provider]  aspirin 81 MG tablet Take 81 mg by mouth in the morning.   Yes [provider]  atorvastatin (LIPITOR) 40 MG tablet Take 40 mg by mouth at bedtime.   Yes [provider]  Calcium Carbonate (CALCIUM-CARB 600 PO) Take 600 mg by mouth daily.   Yes [provider]  Cholecalciferol (VITAMIN D3) 10 MCG (400 UNIT) CAPS Take 400 Units by mouth daily.   Yes [provider]  clobetasol ointment (TEMOVATE) 1.01 % Apply 1 application topically 2 (two) times daily as needed (for  irritation). 09/30/12  Yes [provider]  Cranberry-Vitamin C-Vitamin E 4200-20-3 MG-MG-UNIT CAPS Take 1 capsule by mouth in the morning.   Yes [provider]  ELIQUIS 5 MG TABS tablet Take 5 mg by mouth every 12 (twelve) hours.   Yes [provider]  empagliflozin (JARDIANCE) 10 MG TABS tablet Take 10 mg by mouth daily.   Yes [provider]  enalapril (VASOTEC) 10 MG tablet Take 10 mg by mouth 2 (two) times daily.   Yes [provider]  magnesium oxide (MAG-OX) 400 MG tablet Take 400 mg by mouth 2 (two) times daily.   Yes [provider]  metoprolol succinate (TOPROL-XL) 50 MG 24 hr tablet Take 50 mg by mouth in the morning. Take with or immediately following a meal.   Yes [provider]  mirtazapine (REMERON) 7.5 mg TABS tablet Take 15 mg by mouth at bedtime.   Yes [provider]  Multiple Vitamins-Minerals (CENTRUM SILVER 50+WOMEN) TABS Take 1 tablet by mouth daily with breakfast.   Yes [provider]  potassium chloride (KLOR-CON) 20 MEQ packet Take 20-40 mEq by mouth See admin instructions. Take 20-40 mEq by mouth up to two times a day, AS DIRECTED, only when taking Torsemide   Yes [provider]  spironolactone (ALDACTONE) 25 MG tablet Take 25 mg by mouth daily.   Yes [provider]  torsemide (DEMADEX) 10 MG tablet Take 20-30 mg by mouth See admin instructions. Take 20-30 mg by mouth once daily, AS DIRECTED- based upon either overnight weight gain or weekly weight gain   Yes [provider]  vancomycin (VANCOCIN) 5000 MG injection Inject 1 g into the vein daily at 6 PM. Last dose is on 03/06/2021 03/01/21 03/06/21 Yes [provider]  vitamin B-12 (CYANOCOBALAMIN) 1000 MCG tablet Take 1,000 mcg by mouth daily.   Yes [provider]  ezetimibe (ZETIA) 10 MG tablet Take 0.5 tablets (5 mg total) by mouth daily. Patient not taking: Reported on 03/03/2021 08/23/15    Minna Merritts, MD  simvastatin (ZOCOR) 40 MG tablet Take 1 tablet (40 mg total) by mouth every evening. Patient not taking: Reported on 03/03/2021 04/09/12 02/07/18  Minna Merritts, MD    Physical Exam: Vitals:   03/03/21 1245 03/03/21 1300 03/03/21 1315 03/03/21 1330  BP: 109/69 115/61 128/66 (!) 71/56  Pulse: (!) 102 (!) 106 (!) 107 (!) 104  Resp: (!) 21 19 (!) 26 20  Temp:      TempSrc:      SpO2: 98% 99% 99% 98%  Weight:      Height:        Constitutional: NAD, calm, comfortable Vitals:  03/03/21 1245 03/03/21 1300 03/03/21 1315 03/03/21 1330  BP: 109/69 115/61 128/66 (!) 71/56  Pulse: (!) 102 (!) 106 (!) 107 (!) 104  Resp: (!) 21 19 (!) 26 20  Temp:      TempSrc:      SpO2: 98% 99% 99% 98%  Weight:      Height:       Eyes: PERRL, lids and conjunctivae normal ENMT: Mucous membranes are moist. Posterior pharynx clear of any exudate or lesions.Normal dentition.  Neck: normal, supple, no masses, no thyromegaly Respiratory: clear to auscultation bilaterally, no wheezing, no crackles. Normal respiratory effort. No accessory muscle use.  Cardiovascular: Regular rate and rhythm, no murmurs / rubs / gallops. No extremity edema. 2+ pedal pulses. No carotid bruits.  Abdomen: no tenderness, no masses palpated. No hepatosplenomegaly. Bowel sounds positive.  Musculoskeletal: no clubbing / cyanosis. No joint deformity upper and lower extremities. Good ROM, no contractures. Normal muscle tone.  Skin: no rashes, lesions, ulcers. No induration Neurologic: CN 2-12 grossly intact. Sensation intact, DTR normal. Strength 5/5 in all 4.  Psychiatric: Normal judgment and insight. Alert and oriented x 3. Normal mood.   (Anything < 9 systems with 2 bullets each down codes to level 1) (If patient refuses exam can't bill higher level) (Make sure to document decubitus ulcers present on admission -- if possible -- and whether patient has chronic indwelling catheter at time of admission)  Labs  on Admission: I have personally reviewed following labs and imaging studies  CBC: Recent Labs  Lab 03/03/21 1040 03/03/21 1049  WBC 7.7  --   NEUTROABS 6.1  --   HGB 10.9* 11.2*  HCT 36.5 33.0*  MCV 86.7  --   PLT 248  --    Basic Metabolic Panel: Recent Labs  Lab 03/03/21 1040 03/03/21 1049  NA 135 136  K 4.3 4.3  CL 102 104  CO2 25  --   GLUCOSE 209* 199*  BUN 21 25*  CREATININE 0.78 0.70  CALCIUM 10.2  --    GFR: Estimated Creatinine Clearance: 43 mL/min (by C-G formula based on SCr of 0.7 mg/dL). Liver Function Tests: Recent Labs  Lab 03/03/21 1040  AST 32  ALT 27  ALKPHOS 79  BILITOT 0.6  PROT 6.2*  ALBUMIN 3.4*   No results for input(s): LIPASE, AMYLASE in the last 168 hours. No results for input(s): AMMONIA in the last 168 hours. Coagulation Profile: Recent Labs  Lab 03/03/21 1040  INR 1.5*   Cardiac Enzymes: No results for input(s): CKTOTAL, CKMB, CKMBINDEX, TROPONINI in the last 168 hours. BNP (last 3 results) No results for input(s): PROBNP in the last 8760 hours. HbA1C: No results for input(s): HGBA1C in the last 72 hours. CBG: Recent Labs  Lab 03/03/21 1040  GLUCAP 181*   Lipid Profile: No results for input(s): CHOL, HDL, LDLCALC, TRIG, CHOLHDL, LDLDIRECT in the last 72 hours. Thyroid Function Tests: No results for input(s): TSH, T4TOTAL, FREET4, T3FREE, THYROIDAB in the last 72 hours. Anemia Panel: No results for input(s): VITAMINB12, FOLATE, FERRITIN, TIBC, IRON, RETICCTPCT in the last 72 hours. Urine analysis:    Component Value Date/Time   COLORURINE Straw 09/14/2014 1941   COLORURINE YELLOW 04/04/2010 1014   APPEARANCEUR Clear 09/14/2014 1941   LABSPEC 1.005 09/14/2014 1941   PHURINE 7.0 09/14/2014 1941   PHURINE 7.5 04/04/2010 1014   GLUCOSEU Negative 09/14/2014 1941   HGBUR Negative 09/14/2014 1941   HGBUR NEGATIVE 04/04/2010 1014   BILIRUBINUR Negative 09/14/2014 1941  KETONESUR Negative 09/14/2014 1941   KETONESUR  NEGATIVE 04/04/2010 1014   PROTEINUR Negative 09/14/2014 1941   PROTEINUR NEGATIVE 04/04/2010 1014   UROBILINOGEN 0.2 04/04/2010 1014   NITRITE Negative 09/14/2014 1941   NITRITE NEGATIVE 04/04/2010 1014   LEUKOCYTESUR Negative 09/14/2014 1941    Radiological Exams on Admission: CT ANGIO HEAD W OR WO CONTRAST  Result Date: 03/03/2021 CLINICAL DATA:  Acute neuro deficit. Right-sided weakness and facial droop EXAM: CT ANGIOGRAPHY HEAD AND NECK TECHNIQUE: Multidetector CT imaging of the head and neck was performed using the standard protocol during bolus administration of intravenous contrast. Multiplanar CT image reconstructions and MIPs were obtained to evaluate the vascular anatomy. Carotid stenosis measurements (when applicable) are obtained utilizing NASCET criteria, using the distal internal carotid diameter as the denominator. CONTRAST:  19mL OMNIPAQUE IOHEXOL 350 MG/ML SOLN COMPARISON:  CT head 03/03/2021 FINDINGS: CTA NECK FINDINGS Aortic arch: Extensive atherosclerotic calcification aortic arch without aneurysm. Atherosclerotic calcification in the proximal great vessels. 75% diameter stenosis proximal left subclavian artery. Right carotid system: Mild atherosclerotic calcification right common carotid artery without stenosis. Postop right carotid endarterectomy which is widely patent. Focal calcification within the right internal carotid artery approximately 2 cm below the skull base. This has an unusual appearance in that there is no surrounding plaque. This appears to be intraluminal in location and is causing significant stenosis. There is minimal opacified lumen adjacent to the calcification. Estimated 80% diameter stenosis. Left carotid system: Extensive atherosclerotic calcification throughout the left common carotid artery with multiple areas of 50% diameter stenosis. Extensive atherosclerotic calcification left carotid bifurcation and proximal left internal carotid artery. Approximately  60% diameter stenosis distal left common carotid artery and 70% diameter stenosis proximal left internal carotid artery. Vertebral arteries: Left vertebral artery dominant and patent to the basilar. Mild stenosis distal left vertebral artery Occlusion of the proximal right vertebral artery which reconstitutes and is patent to the basilar. Moderate to severe calcific stenosis at the C1 level on the right. Skeleton: Cervical spondylosis.  No acute skeletal abnormality. Other neck: No soft tissue mass.  Prominent jugular vein bilaterally Upper chest: Severe apical emphysema bilaterally. No pneumothorax or infiltrate. Precarinal lymph nodes up to 15 mm in diameter. AP window lymph node 11 mm in diameter. Review of the MIP images confirms the above findings CTA HEAD FINDINGS Anterior circulation: Extensive atherosclerotic disease throughout the cavernous carotid bilaterally with moderate stenosis bilaterally. Anterior and middle cerebral arteries patent bilaterally without large vessel occlusion or significant stenosis. Posterior circulation: Mild stenosis distal left vertebral artery moderate to severe stenosis distal right vertebral artery. Both vertebral arteries patent to the basilar. Basilar is patent. Superior cerebellar and posterior cerebral arteries patent bilaterally. Fetal origin right posterior cerebral artery. Venous sinuses: Normal venous enhancement Anatomic variants: None Review of the MIP images confirms the above findings IMPRESSION: 1. Negative for intracranial large vessel occlusion 2. Advanced atherosclerotic disease. Atherosclerotic aortic arch. 75% diameter stenosis proximal left subclavian artery 3. Right carotid endarterectomy widely patent. Focal calcific stenosis distal right internal carotid artery approximately 80% diameter stenosis. This could be an embolized calcific fragment versus atherosclerotic disease. Moderate stenosis right cavernous carotid artery. 4. Extensive atherosclerotic  disease throughout the left carotid artery. Approximately 60% diameter stenosis distal left common carotid artery and 70% diameter stenosis proximal left internal carotid artery. Moderate stenosis left cavernous carotid artery. 5. 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 Mild stenosis distal left vertebral artery. 6. Severe apical emphysema. Electronically Signed  By: Franchot Gallo M.D.   On: 03/03/2021 11:25   CT ANGIO NECK W OR WO CONTRAST  Result Date: 03/03/2021 CLINICAL DATA:  Acute neuro deficit. Right-sided weakness and facial droop EXAM: CT ANGIOGRAPHY HEAD AND NECK TECHNIQUE: Multidetector CT imaging of the head and neck was performed using the standard protocol during bolus administration of intravenous contrast. Multiplanar CT image reconstructions and MIPs were obtained to evaluate the vascular anatomy. Carotid stenosis measurements (when applicable) are obtained utilizing NASCET criteria, using the distal internal carotid diameter as the denominator. CONTRAST:  59mL OMNIPAQUE IOHEXOL 350 MG/ML SOLN COMPARISON:  CT head 03/03/2021 FINDINGS: CTA NECK FINDINGS Aortic arch: Extensive atherosclerotic calcification aortic arch without aneurysm. Atherosclerotic calcification in the proximal great vessels. 75% diameter stenosis proximal left subclavian artery. Right carotid system: Mild atherosclerotic calcification right common carotid artery without stenosis. Postop right carotid endarterectomy which is widely patent. Focal calcification within the right internal carotid artery approximately 2 cm below the skull base. This has an unusual appearance in that there is no surrounding plaque. This appears to be intraluminal in location and is causing significant stenosis. There is minimal opacified lumen adjacent to the calcification. Estimated 80% diameter stenosis. Left carotid system: Extensive atherosclerotic calcification throughout the left common carotid  artery with multiple areas of 50% diameter stenosis. Extensive atherosclerotic calcification left carotid bifurcation and proximal left internal carotid artery. Approximately 60% diameter stenosis distal left common carotid artery and 70% diameter stenosis proximal left internal carotid artery. Vertebral arteries: Left vertebral artery dominant and patent to the basilar. Mild stenosis distal left vertebral artery Occlusion of the proximal right vertebral artery which reconstitutes and is patent to the basilar. Moderate to severe calcific stenosis at the C1 level on the right. Skeleton: Cervical spondylosis.  No acute skeletal abnormality. Other neck: No soft tissue mass.  Prominent jugular vein bilaterally Upper chest: Severe apical emphysema bilaterally. No pneumothorax or infiltrate. Precarinal lymph nodes up to 15 mm in diameter. AP window lymph node 11 mm in diameter. Review of the MIP images confirms the above findings CTA HEAD FINDINGS Anterior circulation: Extensive atherosclerotic disease throughout the cavernous carotid bilaterally with moderate stenosis bilaterally. Anterior and middle cerebral arteries patent bilaterally without large vessel occlusion or significant stenosis. Posterior circulation: Mild stenosis distal left vertebral artery moderate to severe stenosis distal right vertebral artery. Both vertebral arteries patent to the basilar. Basilar is patent. Superior cerebellar and posterior cerebral arteries patent bilaterally. Fetal origin right posterior cerebral artery. Venous sinuses: Normal venous enhancement Anatomic variants: None Review of the MIP images confirms the above findings IMPRESSION: 1. Negative for intracranial large vessel occlusion 2. Advanced atherosclerotic disease. Atherosclerotic aortic arch. 75% diameter stenosis proximal left subclavian artery 3. Right carotid endarterectomy widely patent. Focal calcific stenosis distal right internal carotid artery approximately 80%  diameter stenosis. This could be an embolized calcific fragment versus atherosclerotic disease. Moderate stenosis right cavernous carotid artery. 4. Extensive atherosclerotic disease throughout the left carotid artery. Approximately 60% diameter stenosis distal left common carotid artery and 70% diameter stenosis proximal left internal carotid artery. Moderate stenosis left cavernous carotid artery. 5. 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 Mild stenosis distal left vertebral artery. 6. Severe apical emphysema. Electronically Signed   By: Franchot Gallo M.D.   On: 03/03/2021 11:25   CT HEAD CODE STROKE WO CONTRAST  Result Date: 03/03/2021 CLINICAL DATA:  Code stroke. Acute neuro deficit. Right-sided weakness, facial droop EXAM: CT HEAD WITHOUT CONTRAST TECHNIQUE:  Contiguous axial images were obtained from the base of the skull through the vertex without intravenous contrast. COMPARISON:  None. FINDINGS: Brain: No evidence of acute infarction, hemorrhage, hydrocephalus, extra-axial collection or mass lesion/mass effect. Mild white matter hypodensity bilaterally in the frontal lobes, symmetric and appearing chronic. Vascular: Negative for hyperdense vessel. Atherosclerotic calcification in the cavernous carotid bilaterally and in the distal vertebral artery bilaterally. Skull: Negative Sinuses/Orbits: Mild mucosal edema ethmoid sinuses bilaterally otherwise clear. Negative orbit Other: None ASPECTS ([place] Stroke Program Early CT Score) - Ganglionic level infarction (caudate, lentiform nuclei, internal capsule, insula, M1-M3 cortex): 7 - Supraganglionic infarction (M4-M6 cortex): 3 Total score (0-10 with 10 being normal): 10 IMPRESSION: 1. No acute intracranial abnormality 2. ASPECTS is 10 3. Mild chronic microvascular ischemic change in the white matter. 4. Code stroke imaging results were communicated on 03/03/2021 at 10:50 am to provider Bhagat via text page  Electronically Signed   By: Franchot Gallo M.D.   On: 03/03/2021 10:51    EKG: Independently reviewed.  A flutter alternated with ventricular paced rhythm  Assessment/Plan Active Problems:   TIA (transient ischemic attack)  (please populate well all problems here in Problem List. (For example, if patient is on BP meds at home and you resume or decide to hold them, it is a problem that needs to be her. Same for CAD, COPD, HLD and so on)  TIA -Suspect this is related to her severe PVD, unlikely embolic given she is on Eliquis. -Continue aspirin and Eliquis -Unable to perform MRI due to a new insert temporary CRT (old PPM was removed 5 weeks ago due to persistent MRSA bacteremia), neurology plan to repeat CAT scan. -PT OT, speech evaluation  A flutter, chronic, rate uncontrolled -Dentist pacemaker, results showed atrium rate> 300 with 3-1 transduction.  Her EKG and monitor however showed a waveform in and out pacing. And overall HR 100-110s.  Reviewed [facility]'s record, looks like patient used to be on beta-blocker but caused significant bradycardia and was cut down to 50 mg daily, and patient was started on amiodarone, which was reduced to 100 mg daily.  D/W patient's from cardiology at [facility] Dr. Norm Salt, given pt's complicated cardiac history and poorly controled a flutter, recommend patient transferred to [facility] for ablation (patient had cardioversion x3 this year).  -Transfer to [facility] when bed available. -Dr. Glennon Mac agreed with increasing betablocker for now.  MRSA bacteremia -Continue vancomycin, last dose will be 3/14.  IIDM with hyperglycemia -Add sliding scale.  Chronic systolic CHF/ischemic cardiomyopathy -Appears to be euvolemic, continue torsemide, spironolactone  DVT prophylaxis: Eliquis  code Status: Full Code Family Communication: Daughter at bedside Disposition Plan: Tele> transfer to [facility] when bed available Consults called:  Cardiology Admission status: Tele admit   Lequita Halt MD [facility] Pager [phone]  03/03/2021, 2:05 PM

## 2021-03-03 NOTE — ED Provider Notes (Signed)
[facility] EMERGENCY DEPARTMENT Provider Note   CSN: [id number] Arrival date & time: 03/03/21  1038  An emergency department physician performed an initial assessment on this suspected stroke patient at 1037.  History Chief Complaint  Patient presents with  . Code Stroke    Miranda Manning is a 75 y.o. female.  Patient presents to ER chief complaint of right-sided weakness right-sided facial droop.  Symptoms are noted about an hour and a half prior to arrival to the ER by her family members.  They were concerned about a stroke and was brought to the ER for further evaluation.  No reports of fever cough or vomiting or diarrhea.  Patient denies any pain or discomfort at this time.  No complaints of fevers or cough.  No vomiting or diarrhea.  Per EMS they had noticed deficits upon their arrival, however currently they state that she looks much better.        Past Medical History:  Diagnosis Date  . Anxiety   . CAD (coronary artery disease)   . Cataract    only in one eye per pt.  . Cerebrovascular disease   . Colon polyp   . Diverticulosis   . Emphysema lung (HCC)    no COPD per pt! - good lung fx, centrilobular emphysema on CT  . LBBB (left bundle branch block)   . Mitral regurgitation    Severe  . Other and unspecified hyperlipidemia   . Personal history of tobacco use, presenting hazards to health   . Pulmonary fibrosis ([place])    per pt, does not have -  seen on abd CT 2015  . Type II or unspecified type diabetes mellitus without mention of complication, not stated as uncontrolled   . Unspecified essential hypertension   . Vertigo    in past    Patient Active Problem List   Diagnosis Date Noted  . Carotid stenosis, asymptomatic 05/01/2016  . Pulmonary nodules 10/19/2015  . Centrilobular emphysema ([place]) 10/05/2014  . Abdominal pain 09/22/2014  . Mitral valve regurgitation 05/19/2013  . Personal history of colonic polyps 10/28/2012  . Carotid artery  stenosis 02/07/2010  . DM 08/27/2009  . Hyperlipidemia 08/27/2009  . Essential hypertension 08/27/2009  . Coronary atherosclerosis 08/27/2009  . Left bundle branch block 08/27/2009  . Cerebrovascular disease 08/27/2009  . TOBACCO ABUSE, HX OF 08/27/2009    Past Surgical History:  Procedure Laterality Date  . ABDOMINAL HYSTERECTOMY    . ANTERIOR CRUCIATE LIGAMENT REPAIR     After accident/ right knee  . BREAST BIOPSY Left 2008   benign  . CARDIAC SURGERY    . CAROTID ENDARTERECTOMY     right side/ one stent  . COLONOSCOPY    . PACEMAKER IMPLANT    . Roy   Leg  . VESICOVAGINAL FISTULA CLOSURE W/ TAH  1981     OB History   No obstetric history on file.     Family History  Problem Relation Age of Onset  . Hypertension Mother   . Rheum arthritis Mother   . Heart attack Father 56  . COPD Father   . Allergies Son   . Heart disease Brother        before age 25  . Breast cancer Paternal Aunt   . Colon cancer Neg Hx   . Stomach cancer Neg Hx     Social History   Tobacco Use  . Smoking status: Former Smoker    Packs/day:  1.00    Years: 25.00    Pack years: 25.00    Types: Cigarettes    Quit date: 02/25/1979    Years since quitting: 42.0  . Smokeless tobacco: Never Used  Vaping Use  . Vaping Use: Never used  Substance Use Topics  . Alcohol use: No    Alcohol/week: 0.0 standard drinks  . Drug use: No    Home Medications Prior to Admission medications   Medication Sig Start Date End Date Taking? Authorizing Provider  albuterol (VENTOLIN HFA) 108 (90 Base) MCG/ACT inhaler Inhale 2 puffs into the lungs every 6 (six) hours as needed for wheezing or shortness of breath. 09/21/20  Yes Kasa, Maretta Bees, MD  amiodarone (PACERONE) 200 MG tablet Take 100 mg by mouth in the morning.   Yes [provider]  aspirin 81 MG tablet Take 81 mg by mouth in the morning.   Yes [provider]  atorvastatin (LIPITOR) 40 MG tablet Take 40 mg by mouth at  bedtime.   Yes [provider]  Calcium Carbonate (CALCIUM-CARB 600 PO) Take 600 mg by mouth daily.   Yes [provider]  Cholecalciferol (VITAMIN D3) 10 MCG (400 UNIT) CAPS Take 400 Units by mouth daily.   Yes [provider]  clobetasol ointment (TEMOVATE) 2.42 % Apply 1 application topically 2 (two) times daily as needed (for irritation). 09/30/12  Yes [provider]  Cranberry-Vitamin C-Vitamin E 4200-20-3 MG-MG-UNIT CAPS Take 1 capsule by mouth in the morning.   Yes [provider]  ELIQUIS 5 MG TABS tablet Take 5 mg by mouth every 12 (twelve) hours.   Yes [provider]  empagliflozin (JARDIANCE) 10 MG TABS tablet Take 10 mg by mouth daily.   Yes [provider]  enalapril (VASOTEC) 10 MG tablet Take 10 mg by mouth 2 (two) times daily.   Yes [provider]  magnesium oxide (MAG-OX) 400 MG tablet Take 400 mg by mouth 2 (two) times daily.   Yes [provider]  metoprolol succinate (TOPROL-XL) 50 MG 24 hr tablet Take 50 mg by mouth in the morning. Take with or immediately following a meal.   Yes [provider]  mirtazapine (REMERON) 7.5 mg TABS tablet Take 15 mg by mouth at bedtime.   Yes [provider]  Multiple Vitamins-Minerals (CENTRUM SILVER 50+WOMEN) TABS Take 1 tablet by mouth daily with breakfast.   Yes [provider]  potassium chloride (KLOR-CON) 20 MEQ packet Take 20-40 mEq by mouth See admin instructions. Take 20-40 mEq by mouth up to two times a day, AS DIRECTED, only when taking Torsemide   Yes [provider]  spironolactone (ALDACTONE) 25 MG tablet Take 25 mg by mouth daily.   Yes [provider]  torsemide (DEMADEX) 10 MG tablet Take 20-30 mg by mouth See admin instructions. Take 20-30 mg by mouth once daily, AS DIRECTED- based upon either overnight weight gain or weekly weight gain   Yes [provider]  vancomycin (VANCOCIN) 5000 MG  injection Inject 1 g into the vein daily at 6 PM. Last dose is on 03/06/2021 03/01/21 03/06/21 Yes [provider]  vitamin B-12 (CYANOCOBALAMIN) 1000 MCG tablet Take 1,000 mcg by mouth daily.   Yes [provider]  ezetimibe (ZETIA) 10 MG tablet Take 0.5 tablets (5 mg total) by mouth daily. Patient not taking: Reported on 03/03/2021 08/23/15   Minna Merritts, MD  simvastatin (ZOCOR) 40 MG tablet Take 1 tablet (40 mg total) by mouth  every evening. Patient not taking: Reported on 03/03/2021 04/09/12 02/07/18  Minna Merritts, MD    Allergies    Wound dressing adhesive and Codeine  Review of Systems   Review of Systems  Constitutional: Negative for fever.  HENT: Negative for ear pain.   Eyes: Negative for pain.  Respiratory: Negative for cough.   Cardiovascular: Negative for chest pain.  Gastrointestinal: Negative for abdominal pain.  Genitourinary: Negative for flank pain.  Musculoskeletal: Negative for back pain.  Skin: Negative for rash.  Neurological: Negative for headaches.    Physical Exam Updated Vital Signs BP (!) 71/56   Pulse (!) 104   Temp 98.9 F (37.2 C) (Temporal)   Resp 20   Ht 5' (1.524 m)   Wt 54 kg   SpO2 98%   BMI 23.25 kg/m   Physical Exam Constitutional:      General: She is not in acute distress.    Appearance: Normal appearance.  HENT:     Head: Normocephalic.     Nose: Nose normal.  Eyes:     Extraocular Movements: Extraocular movements intact.  Cardiovascular:     Rate and Rhythm: Normal rate.  Pulmonary:     Effort: Pulmonary effort is normal.  Musculoskeletal:        General: Normal range of motion.     Cervical back: Normal range of motion.  Neurological:     Mental Status: She is alert.     Comments: Patient awake alert, answering questions.  Cranial nerves II through XII intact.  Strength 5/5 all extremities.     ED Results / Procedures / Treatments   Labs (all labs ordered are listed, but only abnormal  results are displayed) Labs Reviewed  PROTIME-INR - Abnormal; Notable for the following components:      Result Value   Prothrombin Time 17.7 (*)    INR 1.5 (*)    All other components within normal limits  CBC - Abnormal; Notable for the following components:   Hemoglobin 10.9 (*)    MCH 25.9 (*)    MCHC 29.9 (*)    RDW 20.0 (*)    All other components within normal limits  COMPREHENSIVE METABOLIC PANEL - Abnormal; Notable for the following components:   Glucose, Bld 209 (*)    Total Protein 6.2 (*)    Albumin 3.4 (*)    All other components within normal limits  I-STAT CHEM 8, ED - Abnormal; Notable for the following components:   BUN 25 (*)    Glucose, Bld 199 (*)    Hemoglobin 11.2 (*)    HCT 33.0 (*)    All other components within normal limits  CBG MONITORING, ED - Abnormal; Notable for the following components:   Glucose-Capillary 181 (*)    All other components within normal limits  TROPONIN I (HIGH SENSITIVITY) - Abnormal; Notable for the following components:   Troponin I (High Sensitivity) 24 (*)    All other components within normal limits  RESP PANEL BY RT-PCR (FLU A&B, COVID) ARPGX2  ETHANOL  APTT  DIFFERENTIAL  RAPID URINE DRUG SCREEN, HOSP PERFORMED  URINALYSIS, ROUTINE W REFLEX MICROSCOPIC  TROPONIN I (HIGH SENSITIVITY)    EKG None  Radiology CT ANGIO HEAD W OR WO CONTRAST  Result Date: 03/03/2021 CLINICAL DATA:  Acute neuro deficit. Right-sided weakness and facial droop EXAM: CT ANGIOGRAPHY HEAD AND NECK TECHNIQUE: Multidetector CT imaging of the head and neck was performed using the standard protocol during bolus administration of intravenous  contrast. Multiplanar CT image reconstructions and MIPs were obtained to evaluate the vascular anatomy. Carotid stenosis measurements (when applicable) are obtained utilizing NASCET criteria, using the distal internal carotid diameter as the denominator. CONTRAST:  33mL OMNIPAQUE IOHEXOL 350 MG/ML SOLN COMPARISON:   CT head 03/03/2021 FINDINGS: CTA NECK FINDINGS Aortic arch: Extensive atherosclerotic calcification aortic arch without aneurysm. Atherosclerotic calcification in the proximal great vessels. 75% diameter stenosis proximal left subclavian artery. Right carotid system: Mild atherosclerotic calcification right common carotid artery without stenosis. Postop right carotid endarterectomy which is widely patent. Focal calcification within the right internal carotid artery approximately 2 cm below the skull base. This has an unusual appearance in that there is no surrounding plaque. This appears to be intraluminal in location and is causing significant stenosis. There is minimal opacified lumen adjacent to the calcification. Estimated 80% diameter stenosis. Left carotid system: Extensive atherosclerotic calcification throughout the left common carotid artery with multiple areas of 50% diameter stenosis. Extensive atherosclerotic calcification left carotid bifurcation and proximal left internal carotid artery. Approximately 60% diameter stenosis distal left common carotid artery and 70% diameter stenosis proximal left internal carotid artery. Vertebral arteries: Left vertebral artery dominant and patent to the basilar. Mild stenosis distal left vertebral artery Occlusion of the proximal right vertebral artery which reconstitutes and is patent to the basilar. Moderate to severe calcific stenosis at the C1 level on the right. Skeleton: Cervical spondylosis.  No acute skeletal abnormality. Other neck: No soft tissue mass.  Prominent jugular vein bilaterally Upper chest: Severe apical emphysema bilaterally. No pneumothorax or infiltrate. Precarinal lymph nodes up to 15 mm in diameter. AP window lymph node 11 mm in diameter. Review of the MIP images confirms the above findings CTA HEAD FINDINGS Anterior circulation: Extensive atherosclerotic disease throughout the cavernous carotid bilaterally with moderate stenosis bilaterally.  Anterior and middle cerebral arteries patent bilaterally without large vessel occlusion or significant stenosis. Posterior circulation: Mild stenosis distal left vertebral artery moderate to severe stenosis distal right vertebral artery. Both vertebral arteries patent to the basilar. Basilar is patent. Superior cerebellar and posterior cerebral arteries patent bilaterally. Fetal origin right posterior cerebral artery. Venous sinuses: Normal venous enhancement Anatomic variants: None Review of the MIP images confirms the above findings IMPRESSION: 1. Negative for intracranial large vessel occlusion 2. Advanced atherosclerotic disease. Atherosclerotic aortic arch. 75% diameter stenosis proximal left subclavian artery 3. Right carotid endarterectomy widely patent. Focal calcific stenosis distal right internal carotid artery approximately 80% diameter stenosis. This could be an embolized calcific fragment versus atherosclerotic disease. Moderate stenosis right cavernous carotid artery. 4. Extensive atherosclerotic disease throughout the left carotid artery. Approximately 60% diameter stenosis distal left common carotid artery and 70% diameter stenosis proximal left internal carotid artery. Moderate stenosis left cavernous carotid artery. 5. 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 Mild stenosis distal left vertebral artery. 6. Severe apical emphysema. Electronically Signed   By: Franchot Gallo M.D.   On: 03/03/2021 11:25   CT ANGIO NECK W OR WO CONTRAST  Result Date: 03/03/2021 CLINICAL DATA:  Acute neuro deficit. Right-sided weakness and facial droop EXAM: CT ANGIOGRAPHY HEAD AND NECK TECHNIQUE: Multidetector CT imaging of the head and neck was performed using the standard protocol during bolus administration of intravenous contrast. Multiplanar CT image reconstructions and MIPs were obtained to evaluate the vascular anatomy. Carotid stenosis measurements (when  applicable) are obtained utilizing NASCET criteria, using the distal internal carotid diameter as the denominator. CONTRAST:  18mL OMNIPAQUE IOHEXOL 350  MG/ML SOLN COMPARISON:  CT head 03/03/2021 FINDINGS: CTA NECK FINDINGS Aortic arch: Extensive atherosclerotic calcification aortic arch without aneurysm. Atherosclerotic calcification in the proximal great vessels. 75% diameter stenosis proximal left subclavian artery. Right carotid system: Mild atherosclerotic calcification right common carotid artery without stenosis. Postop right carotid endarterectomy which is widely patent. Focal calcification within the right internal carotid artery approximately 2 cm below the skull base. This has an unusual appearance in that there is no surrounding plaque. This appears to be intraluminal in location and is causing significant stenosis. There is minimal opacified lumen adjacent to the calcification. Estimated 80% diameter stenosis. Left carotid system: Extensive atherosclerotic calcification throughout the left common carotid artery with multiple areas of 50% diameter stenosis. Extensive atherosclerotic calcification left carotid bifurcation and proximal left internal carotid artery. Approximately 60% diameter stenosis distal left common carotid artery and 70% diameter stenosis proximal left internal carotid artery. Vertebral arteries: Left vertebral artery dominant and patent to the basilar. Mild stenosis distal left vertebral artery Occlusion of the proximal right vertebral artery which reconstitutes and is patent to the basilar. Moderate to severe calcific stenosis at the C1 level on the right. Skeleton: Cervical spondylosis.  No acute skeletal abnormality. Other neck: No soft tissue mass.  Prominent jugular vein bilaterally Upper chest: Severe apical emphysema bilaterally. No pneumothorax or infiltrate. Precarinal lymph nodes up to 15 mm in diameter. AP window lymph node 11 mm in diameter. Review of the MIP images  confirms the above findings CTA HEAD FINDINGS Anterior circulation: Extensive atherosclerotic disease throughout the cavernous carotid bilaterally with moderate stenosis bilaterally. Anterior and middle cerebral arteries patent bilaterally without large vessel occlusion or significant stenosis. Posterior circulation: Mild stenosis distal left vertebral artery moderate to severe stenosis distal right vertebral artery. Both vertebral arteries patent to the basilar. Basilar is patent. Superior cerebellar and posterior cerebral arteries patent bilaterally. Fetal origin right posterior cerebral artery. Venous sinuses: Normal venous enhancement Anatomic variants: None Review of the MIP images confirms the above findings IMPRESSION: 1. Negative for intracranial large vessel occlusion 2. Advanced atherosclerotic disease. Atherosclerotic aortic arch. 75% diameter stenosis proximal left subclavian artery 3. Right carotid endarterectomy widely patent. Focal calcific stenosis distal right internal carotid artery approximately 80% diameter stenosis. This could be an embolized calcific fragment versus atherosclerotic disease. Moderate stenosis right cavernous carotid artery. 4. Extensive atherosclerotic disease throughout the left carotid artery. Approximately 60% diameter stenosis distal left common carotid artery and 70% diameter stenosis proximal left internal carotid artery. Moderate stenosis left cavernous carotid artery. 5. 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 Mild stenosis distal left vertebral artery. 6. Severe apical emphysema. Electronically Signed   By: Franchot Gallo M.D.   On: 03/03/2021 11:25   CT HEAD CODE STROKE WO CONTRAST  Result Date: 03/03/2021 CLINICAL DATA:  Code stroke. Acute neuro deficit. Right-sided weakness, facial droop EXAM: CT HEAD WITHOUT CONTRAST TECHNIQUE: Contiguous axial images were obtained from the base of the skull through the vertex  without intravenous contrast. COMPARISON:  None. FINDINGS: Brain: No evidence of acute infarction, hemorrhage, hydrocephalus, extra-axial collection or mass lesion/mass effect. Mild white matter hypodensity bilaterally in the frontal lobes, symmetric and appearing chronic. Vascular: Negative for hyperdense vessel. Atherosclerotic calcification in the cavernous carotid bilaterally and in the distal vertebral artery bilaterally. Skull: Negative Sinuses/Orbits: Mild mucosal edema ethmoid sinuses bilaterally otherwise clear. Negative orbit Other: None ASPECTS ([place] Stroke Program Early CT Score) - Ganglionic level infarction (caudate, lentiform nuclei, internal capsule, insula,  M1-M3 cortex): 7 - Supraganglionic infarction (M4-M6 cortex): 3 Total score (0-10 with 10 being normal): 10 IMPRESSION: 1. No acute intracranial abnormality 2. ASPECTS is 10 3. Mild chronic microvascular ischemic change in the white matter. 4. Code stroke imaging results were communicated on 03/03/2021 at 10:50 am to provider Bhagat via text page Electronically Signed   By: Franchot Gallo M.D.   On: 03/03/2021 10:51    Procedures .Critical Care Performed by: Luna Fuse, MD Authorized by: Luna Fuse, MD   Critical care provider statement:    Critical care time (minutes):  30   Critical care was necessary to treat or prevent imminent or life-threatening deterioration of the following conditions:  CNS failure or compromise Comments:     Stroke alert activation       Medications Ordered in ED Medications  iohexol (OMNIPAQUE) 350 MG/ML injection 75 mL (75 mLs Intravenous Contrast Given 03/03/21 1052)    ED Course  I have reviewed the triage vital signs and the nursing notes.  Pertinent labs & imaging results that were available during my care of the patient were reviewed by me and considered in my medical decision making (see chart for details).    MDM Rules/Calculators/A&P                          Stroke alert  was activated prior to the patient's arrival given description of symptoms.  She was eval by neurology upon arrival in work-up per neurology.  On my exam she has no focal deficit at this time, moving all extremities with no droop or drift noted.  No TPA was given due to improvement of symptoms at this time.  CT head and angio pursued an MRI be pursued as well.  Patient admitted to the hospitalist team   Final Clinical Impression(s) / ED Diagnoses Final diagnoses:  Acute ischemic stroke [facility])    Rx / DC Orders ED Discharge Orders    None       Luna Fuse, MD 03/03/21 1354

## 2021-03-03 NOTE — ED Notes (Signed)
Cardiology at bedside.

## 2021-03-03 NOTE — ED Notes (Signed)
Patient transported to CT 

## 2021-03-03 NOTE — ED Notes (Signed)
Admitting at bedside 

## 2021-03-03 NOTE — Code Documentation (Signed)
Patient from home with daughter, during breakfast at [phone] had a sudden onset of "shaking, bobbing of head," and slurred speech. [facility] arrived and noted right sided weakness, garbled speech (resolved in route), and a facial droop. Code stroke was activated and patient was taken to [facility]. BP and CBG WNL. She was met by the stroke team and cleared for CT by the EDP. Pt taken for CT/CTA. Pt with no pain but did have some nausea. Her NIHSS was a 3 on arrival. (see documentation). CT results below. Pt is on Eliquis and had a recent pacemaker placed this month. 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 She came in with a PICC line. Pt is not a candidate for TPA r/t blood thinner. She is not LVO positive therefore no acute treatment necessary. Care Plan: q2x12 then q4 vitals/neuro checks. Hand off with Claiborne Billings RN.   "IMPRESSION: 1. No acute intracranial abnormality 2. ASPECTS is 10 3. Mild chronic microvascular ischemic change in the white matter. 4. Code stroke imaging results were communicated on 03/03/2021 at 10:50 am to provider Bhagat via text page"    Cece Milhouse, Rande Brunt, RN  Stroke Response Nurse

## 2021-03-03 NOTE — ED Triage Notes (Signed)
Patient  Presents to ED via [facility]  Per daughter  Patient was last seen normal at [phone], states she had given her , her morning medications and she was fine , then she noticed left sided facial droop and right ar, weakness. Upon arrival to ed per ems patient is much clearer and symptoms have almost resolved.

## 2021-03-03 NOTE — ED Notes (Signed)
Report called, floor to call bed bed is cleaned

## 2021-03-03 NOTE — Progress Notes (Signed)
D/W Neuro, plan to hold Eliquis tonight and decide to resume after tomorrow's repeat CT head to rule out large stroke.

## 2021-03-03 NOTE — Progress Notes (Signed)
Pharmacy Antibiotic Note  Miranda Manning is a 75 y.o. female admitted on 03/03/2021 with bacteremia.  Pharmacy has been consulted for PTA Vancomycin dosing.   Height: 5' (152.4 cm) Weight: 54 kg (119 lb 0.8 oz) IBW/kg (Calculated) : 45.5  Temp (24hrs), Avg:98.9 F (37.2 C), Min:98.9 F (37.2 C), Max:98.9 F (37.2 C)  Recent Labs  Lab 03/03/21 1040 03/03/21 1049 03/03/21 1700  WBC 7.7  --   --   CREATININE 0.78 0.70  --   VANCOTROUGH  --   --  19    Estimated Creatinine Clearance: 43 mL/min (by C-G formula based on SCr of 0.7 mg/dL).    Allergies  Allergen Reactions  . Wound Dressing Adhesive Other (See Comments)    Patient has VERY thin skin on her LOWER EXTREMITIES, use IV3000 and when removing dressings/bandages, use anti-adhesive spray!  . Codeine Rash    Antimicrobials this admission:  2/11 Vancomycin >> 3/14 Per [facility] ID notes  Dose adjustments this admission: N/a  Microbiology results: 1/6 Bcx: MRSA  Plan:  - Vancomycin trough obtained to ensure patient was not significantly supra or sub- therapeutic. Trough was found to be 19 which last dose likely around 1800 per family.  - Continue Vancomycin 1000mg  IV q24h - Per [facility] notes plan is to stop therapy on 3/14 will place a stop date   - Monitor patients renal function and urine output   Thank you for allowing pharmacy to be a part of this patient's care.  Duanne Limerick PharmD. BCPS 03/03/2021 6:35 PM

## 2021-03-03 NOTE — ED Notes (Signed)
pts daughter at bedside requesting vancomycin and transfer to [facility] for cardiology.

## 2021-03-03 NOTE — Progress Notes (Signed)
  Echocardiogram 2D Echocardiogram has been performed.  Miranda Manning 03/03/2021, 2:59 PM

## 2021-03-03 NOTE — ED Notes (Addendum)
Dr Roosevelt Locks at bedside

## 2021-03-03 NOTE — Consult Note (Signed)
Neurology Consultation Reason for Consult: Code stroke for right sided weakness and aphasia Requesting Physician: Thamas Jaegers  CC: Aphasia and right sided weakness   History is obtained from: Patient, daughter at bedside and chart review   HPI: Miranda Manning is a 75 y.o. female with a past medical history significant for hypertension, hyperlipidemia, type 2 diabetes, former smoking (25 pack years, quit 1989), coronary artery disease status post CABG x3 (December 2021), severe mitral valve regurgitation s/p mitral valve replacement (December 2021), left subclavian artery stenosis (blood pressure 10 to 15 mmHg lower on the left and should always be checked on the right arm).  Daughter reports that she is with the patient at home today.  Patient had been in her normal recent baseline and eating some breakfast.  She began to have some shaking and bobbing of her head as if she was falling asleep.  She was assisted to the couch and noted to be weaker on the right side with the right facial droop and some difficulty speaking (slurred speech as well as apparent aphasia).  Her blood sugar had been checked recently before this episode and was 161 on her home glucometer.  The patient's symptoms improved briefly when she was laid down on the couch but then returned.  EMS was activated and daughter expressed a strong preference for the patient to be transported to [facility] over [facility] (daughter is STEMI response coordinator nurse at [employer]).  And route to [facility] the patient's symptoms were rapidly improving.  Daughter notes that prior to her December 7 hospitalization the patient was very functional.  She lives independently, gardens, drove herself and friends, managed her own finances.  There was some slight concern for memory impairment but this was minor.  Since the CABG she has been significantly impaired with her memory and has had a protracted recovery with limited ability to access home PT/OT due to  staffing issues possibly secondary to COVID-19.  The patient has had very poor appetite eating only 400 to 500 cal/day and therefore they have not restricted her diet in any way.  She has been losing weight.  She has not been having fevers chills or other infectious symptoms that daughter has noted.  She is not complaining of headache, she did have some nausea on initial arrival to the ED but this is resolved.  Patient denies any visual complaints at this time and daughter has not heard her complaining of this issue either.  Given the patient's cognitive impairments at this time, daughter does not feel comfortable leaving the patient home alone even for an hour although the patient is ambulatory with a walker and typically oriented to month, year, president, situation.  Additionally the patient's medical course has been quite complicated since her CABG and mitral valve replacement which happened at the same time.  She had postoperative atrial fibrillation and due to slowed conduction had a pacemaker placed.  She subsequently had bacteremia in January (MRSA) for which she was treated with vancomycin and for which the pacemaker was explanted.  This was then reimplanted in mid February although she continues on vancomycin at this time.  Daughter is also concerned about being told the patient had a clot in her lungs, though she was told that this was minor and chronic and likely to resolve over time, she is concerned that apixaban is inadequate anticoagulation and a stronger anticoagulant should be considered.  LKW: 9:25 AM tPA given?: No, symptoms minor/rapidly resolving and Eliquis given at  9:45 AM Premorbid modified rankin scale:      3 - Moderate disability. Requires some help, but able to walk unassisted.  ROS: All other review of systems was negative except as noted in the HPI, within limits of the patient's cognitive impairment.   Past Medical History:  Diagnosis Date  . Anxiety   . CAD (coronary  artery disease)   . Cataract    only in one eye per pt.  . Cerebrovascular disease   . Colon polyp   . Diverticulosis   . Emphysema lung (HCC)    no COPD per pt! - good lung fx, centrilobular emphysema on CT  . LBBB (left bundle branch block)   . Mitral regurgitation    Severe  . Other and unspecified hyperlipidemia   . Personal history of tobacco use, presenting hazards to health   . Pulmonary fibrosis ([place])    per pt, does not have -  seen on abd CT 2015  . Type II or unspecified type diabetes mellitus without mention of complication, not stated as uncontrolled   . Unspecified essential hypertension   . Vertigo    in past   Past Surgical History:  Procedure Laterality Date  . ABDOMINAL HYSTERECTOMY    . ANTERIOR CRUCIATE LIGAMENT REPAIR     After accident/ right knee  . BREAST BIOPSY Left 2008   benign  . CARDIAC SURGERY    . CAROTID ENDARTERECTOMY     right side/ one stent  . COLONOSCOPY    . PACEMAKER IMPLANT    . [place]   Leg  . VESICOVAGINAL FISTULA CLOSURE W/ TAH  1981   Current Meds  Medication Sig  . albuterol (VENTOLIN HFA) 108 (90 Base) MCG/ACT inhaler Inhale 2 puffs into the lungs every 6 (six) hours as needed for wheezing or shortness of breath.  Marland Kitchen amiodarone (PACERONE) 200 MG tablet Take 100 mg by mouth in the morning.  Marland Kitchen aspirin 81 MG tablet Take 81 mg by mouth in the morning.  Marland Kitchen atorvastatin (LIPITOR) 40 MG tablet Take 40 mg by mouth at bedtime.  . Calcium Carbonate (CALCIUM-CARB 600 PO) Take 600 mg by mouth daily.  . Cholecalciferol (VITAMIN D3) 10 MCG (400 UNIT) CAPS Take 400 Units by mouth daily.  . clobetasol ointment (TEMOVATE) 6.21 % Apply 1 application topically 2 (two) times daily as needed (for irritation).  . Cranberry-Vitamin C-Vitamin E 4200-20-3 MG-MG-UNIT CAPS Take 1 capsule by mouth in the morning.  Marland Kitchen ELIQUIS 5 MG TABS tablet Take 5 mg by mouth every 12 (twelve) hours.  . empagliflozin (JARDIANCE) 10 MG TABS tablet Take 10 mg  by mouth daily.  . enalapril (VASOTEC) 10 MG tablet Take 10 mg by mouth 2 (two) times daily.  . magnesium oxide (MAG-OX) 400 MG tablet Take 400 mg by mouth 2 (two) times daily.  . metoprolol succinate (TOPROL-XL) 50 MG 24 hr tablet Take 50 mg by mouth in the morning. Take with or immediately following a meal.  . mirtazapine (REMERON) 7.5 mg TABS tablet Take 15 mg by mouth at bedtime.  . Multiple Vitamins-Minerals (CENTRUM SILVER 50+WOMEN) TABS Take 1 tablet by mouth daily with breakfast.  . potassium chloride (KLOR-CON) 20 MEQ packet Take 20-40 mEq by mouth See admin instructions. Take 20-40 mEq by mouth up to two times a day, AS DIRECTED, only when taking Torsemide  . spironolactone (ALDACTONE) 25 MG tablet Take 25 mg by mouth daily.  Marland Kitchen torsemide (DEMADEX) 10 MG tablet Take 20-30  mg by mouth See admin instructions. Take 20-30 mg by mouth once daily, AS DIRECTED- based upon either overnight weight gain or weekly weight gain  . vancomycin (VANCOCIN) 5000 MG injection Inject 1 g into the vein daily at 6 PM. Last dose is on 03/06/2021  . vitamin B-12 (CYANOCOBALAMIN) 1000 MCG tablet Take 1,000 mcg by mouth daily.     Family History  Problem Relation Age of Onset  . Hypertension Mother   . Rheum arthritis Mother   . Heart attack Father 78  . COPD Father   . Allergies Son   . Heart disease Brother        before age 68  . Breast cancer Paternal Aunt   . Colon cancer Neg Hx   . Stomach cancer Neg Hx      Social History:  reports that she quit smoking about 42 years ago. Her smoking use included cigarettes. She has a 25.00 pack-year smoking history. She has never used smokeless tobacco. She reports that she does not drink alcohol and does not use drugs.   Exam: Current vital signs: BP (!) 71/56   Pulse (!) 104   Temp 98.9 F (37.2 C) (Temporal)   Resp 20   Ht 5' (1.524 m)   Wt 54 kg   SpO2 98%   BMI 23.25 kg/m  Vital signs in last 24 hours: Temp:  [98.9 F (37.2 C)] 98.9 F  (37.2 C) (03/11 1040) Pulse Rate:  [48-107] 104 (03/11 1330) Resp:  [15-26] 20 (03/11 1330) BP: (71-134)/(51-90) 71/56 (03/11 1330) SpO2:  [90 %-100 %] 98 % (03/11 1330) Weight:  [54 kg] 54 kg (03/11 1043)   Physical Exam  Constitutional: Appears well-developed and thin Psych: Affect appropriate to situation, calm and cooperative Eyes: No scleral injection HENT: No oropharyngeal obstruction.  MSK: no joint deformities.  Cardiovascular: Irregularly irregular rhythm with some irregular occasional elevations on cardiac monitor tracing.  Perfusing extremities well. Respiratory: Effort normal, non-labored breathing GI: Soft.  No distension. There is no tenderness.  Skin: Warm dry and intact visible skin  Neuro: Mental Status: Patient is awake, alert, oriented to person, place, month, but not year (19-something). Patient is able to give a limited history. Mild aphasia (calls a pen a pencil), able to repeat Cranial Nerves: II: Visual Fields are full. Pupils are equal, round, and reactive to light. 3 to 2 mm III,IV, VI: EOMI without ptosis or diploplia.  V: Facial sensation is symmetric to temperature VII: Facial movement is symmetric (intermittent mild right droop) VIII: hearing is intact to voice X: Uvula elevates symmetrically XI: Head turn is symmetric XII: tongue is midline without atrophy or fasciculations (intermittent mild to moderate dysarthria) Motor: Tone is normal. Bulk is normal.  4+ to 5/5 strength throughout, other than some bilateral hip flexor weakness 4/5 Sensory: Sensation is symmetric to light touch and temperature in the arms and legs without extinction to DSS  Deep Tendon Reflexes: 2+ and symmetric in the biceps and patellae. 2-3 beats of clonus in the bilateral ankles  Plantars: Toes are downgoing bilaterally.  Cerebellar: FNF and HKS notable for right mild dysmetria initially, resolving to a mild intention tremor bilaterally on repeat testing, otherwise  intact.  NIHSS total 3 Score breakdown:  1 point for inabilty to answer age, 1 point for right arm axtia, 1 point for mild apahsia, 0    I have reviewed labs in epic and the results pertinent to this consultation are: Glu 199 Cr 0.70  12/26/2020  lipid panel from [facility]: Chol 107, HDL 47.2, VLDL 33, LDL 27  A1c at that time at [facility] 5.7%   ECG reviewed with EDP, L BBB felt to be stable from prior  I have reviewed the images obtained:  Head CT w/ chronic microvascular changes that are significant, cannot exclude acute process  CTA on my read w/ extensive athero, no LVO  Radiology read:  1. Negative for intracranial large vessel occlusion 2. Advanced atherosclerotic disease. Atherosclerotic aortic arch. 75% diameter stenosis proximal left subclavian artery 3. Right carotid endarterectomy widely patent. Focal calcific stenosis distal right internal carotid artery approximately 80% diameter stenosis. This could be an embolized calcific fragment versus atherosclerotic disease. Moderate stenosis right cavernous carotid artery. 4. Extensive atherosclerotic disease throughout the left carotid artery. Approximately 60% diameter stenosis distal left common carotid artery and 70% diameter stenosis proximal left internal carotid artery. Moderate stenosis left cavernous carotid artery. 5. 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 Mild stenosis distal left vertebral artery. 6. Severe apical emphysema.  Impression: This is a 75 year old woman with a complicated cardiac history presenting with right facial droop and some difficulty speaking as well as some mild right-sided ataxia in the setting of hypotension, which rapidly improved.  Suspect cardiogenic etiology, cannot rule out a cardio embolic event.  Unfortunately her pacemaker has been placed less than 6 weeks ago so MRI is contraindicated per protocol.  Given her significant cardiac  history, will repeat ECHO to assess for any functional impairment or intracardiac thrombus that may require change in anticoagulation agent.  Will resume Eliquis if head CT is stable indicating no large stroke.  Discussed with daughter that risks of bleeding from a small stroke are relatively small and outweighed by the benefit of preventing additional strokes in the setting of her continued irregular heart rate.  Daughter is in agreement with this plan  Recommendations:  # Possible stroke vs. TIA  - Stroke labs HgbA1c, fasting lipid panel - MRI brain, contraindicated, repeat head CT to exclude significant stroke at approximately 6 PM - Hold Eliquis until confirmed that there is no large stroke on repeat head CT - Frequent neuro checks - Echocardiogram ordered, read pending - Risk factor modification - Telemetry monitoring, known Afib - Blood pressure goal   - Normotension - Pacer interrogation - PT consult, OT consult, Speech consult - Stroke team to follow  Lesleigh Noe MD-PhD [facility] [phone]  Total critical care time: 62 minutes   Critical care time was exclusive of separately billable procedures and treating other patients.   Critical care was necessary to treat or prevent imminent or life-threatening deterioration, concern for acute stroke.   Critical care was time spent personally by me on the following activities: development of treatment plan with patient and/or surrogate as well as nursing, discussions with consultants/primary team, evaluation of patient's response to treatment, examination of patient, obtaining history from patient or surrogate, ordering and performing treatments and interventions, ordering and review of laboratory studies, ordering and review of radiographic studies, and re-evaluation of patient's condition as needed, as documented above.

## 2021-03-03 NOTE — Consult Note (Addendum)
Moderate stuff occurred since December Cardiology Consultation:   Patient ID: Miranda Manning MRN: [id number]; DOB: [birth year]  Admit date: 03/03/2021 Date of Consult: 03/03/2021  PCP:  [place] Pink, MD   [place]  Cardiologist: Dr. Jalene Mullet at [facility]  Electrophysiologist:  None   Patient Profile:   Miranda Manning is a 75 y.o. 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 Roosevelt Locks.  History of Present Illness:   Miranda Manning is a 75 year old female with the above history who is followed by Dr. Jalene Mullet at [facility] Patient has a complex cardiac history as above. Per review in Care Everywhere, patient presented to [facility] in 11/2020 for planned CABG and MVR. Prior to this, she lived alone and was independent in all her activities of daily living. Unfortunately following surgery, her mental status has never returned to baseline and she has had a lot of difficult. She was recently admitted at [facility] from 12/29/2020 to 01/16/2021 for atrial fibrillation/flutter with RVR and acute on chronic combined CHF. Echo showed newly reduced EF of 30% with moderate RV dysfunction felt to be possibly tachycardia induced. She was loaded with IV Amiodarone and underwent successful DCCV on 1/12. She remained in normal sinus rhythm for the remainder of the admission. She was diuresed with IV Lasix. She was discharged on Amiodarone, Toprol-XL, Eliquis, Torsemide,  Spironolactone, and Enalapril. Hospitalization was complicated by hyponatremia (felt to be a combination of SIADH and volume overload), left arm phlebitis, UTI (treated with Ceftriaxone). She was then readmitted from 01/17/2021 to 02/03/2021 with septic shock and found to have MRSA bacteremia. TEE was negative for endocarditis. She underwent PPM removal on 01/24/2021 with placement of temporary pacer. She then had BiV PPM placed on 01/27/2021. She was discharged on IV Vancomycin with plans to continue this until 03/06/2021. She was last seen by Dr. Jalene Mullet on 02/20/2021 at which time she was still very weak and had a recent episode of hypoglycemia. QTc was prolonged so Amiodarone was decreased with plans to stop it on 03/24/2021.   Patient presented to the ED today as a CODE Stroke after she was noticed to have left sided facial droop and right arm weakness. Head CT showed no acute findings. Head/neck CTA was negative for intracranial large vessel occlusion but did show 75% stenosis of proximal left subclavian artery, 80% stenosis of right ICA approximately 2 cm below skull base, multiple areas of 50% stenosis of left common carotid artery, 70% stenosis of proximal left ICAs, and occlusion of the proximal right vertebral artery.  In the ED, EKG showed V-paced rhythm with rate in the 90's. Per device device interrogation, atrial flutter with atrium rate >300 bpm with 3-1. High-sensitivity troponin minimally 24 >> 23. Chest x-ray showed cardiomegaly and chronic interstitial lung disease but no acute findings. WBC 7.7, Hgb 10.9, Plts 248. Na 135, K 4.3, Glucose 209, BUN 21, Cr 0.78. Albumin 3.4. LFTs normal. Ethanol <10. Patient admitted under Hospitalist service. Cardiology consulted to assist  with atrial flutter and CHF. Hospitalist did discuss with Cardiology at [facility] and plan will be to transfer there when a bed is available for possible ablation.  At the time of this evaluation, patient resting comfortably in no acute  distress. Obtained history from patient and daughter Miranda Manning, who is the STEMI coordinator at [employer]. Patient was in her usual state of health this morning. She ate breakfast and then took her morning medications. Shortly after taking her medications, daughter noticed that she was less alert and had facial droop. She moved her to the couch and noticed she had right sided pronator drip and slurred speech. 911 was called. By the time patient arrived to ED, symptoms much improved and now back to baseline. She is severely deconditioned following multiple procedures and hospitalizations over the last 3 months. She has pretty significant dyspnea on exertion and is not very active because of this. No orthopnea or PND. She has had some worsening lower extremity edema recently. She takes Torsemide as needed for weight gain and daughter states she has needed Torsemide 5/7 days this past week. No chest pain. No palpitations. She notes occasional dizziness but no syncope. No recent fevers. Otherwise, in her usual state of health.  Past Medical History:  Diagnosis Date  . Anxiety   . CAD (coronary artery disease)   . Cataract    only in one eye per pt.  . Cerebrovascular disease   . Colon polyp   . Diverticulosis   . Emphysema lung (HCC)    no COPD per pt! - good lung fx, centrilobular emphysema on CT  . LBBB (left bundle branch block)   . Mitral regurgitation    Severe  . Other and unspecified hyperlipidemia   . Personal history of tobacco use, presenting hazards to health   . Pulmonary fibrosis ([place])    per pt, does not have -  seen on abd CT 2015  . Type II or unspecified type diabetes mellitus without mention of complication, not stated as uncontrolled   . Unspecified essential hypertension   . Vertigo    in past    Past Surgical History:  Procedure Laterality Date  . ABDOMINAL HYSTERECTOMY    . ANTERIOR CRUCIATE LIGAMENT REPAIR     After accident/ right knee  . BREAST BIOPSY Left 2008   benign  .  CARDIAC SURGERY    . CAROTID ENDARTERECTOMY     right side/ one stent  . COLONOSCOPY    . PACEMAKER IMPLANT    . [place]   Leg  . VESICOVAGINAL FISTULA CLOSURE W/ TAH  1981     Home Medications:  Prior to Admission medications   Medication Sig Start Date End Date Taking? Authorizing Provider  albuterol (VENTOLIN HFA) 108 (90 Base) MCG/ACT inhaler Inhale 2 puffs into the lungs every 6 (six) hours as needed for wheezing or shortness of breath. 09/21/20  Yes Kasa, Maretta Bees, MD  amiodarone (PACERONE) 200 MG tablet Take 100 mg by mouth in the morning.   Yes [provider]  aspirin 81 MG tablet Take 81 mg by mouth in the morning.   Yes [provider]  atorvastatin (LIPITOR) 40 MG tablet Take 40 mg by mouth at bedtime.   Yes [provider]  Calcium Carbonate (CALCIUM-CARB 600 PO) Take 600 mg by mouth daily.   Yes [provider]  Cholecalciferol (VITAMIN D3) 10 MCG (400 UNIT) CAPS Take 400 Units by mouth daily.   Yes [provider]  clobetasol ointment (TEMOVATE) 1.61 % Apply 1 application topically 2 (two) times daily as needed (for irritation). 09/30/12  Yes [provider]  Cranberry-Vitamin C-Vitamin E 4200-20-3 MG-MG-UNIT CAPS Take 1 capsule by mouth in the morning.   Yes [provider]  ELIQUIS 5 MG TABS tablet Take 5 mg by mouth every 12 (twelve) hours.   Yes [provider]  empagliflozin (JARDIANCE) 10 MG TABS tablet Take 10 mg by mouth daily.   Yes [provider]  enalapril (VASOTEC) 10 MG tablet Take 10 mg by mouth 2 (two) times daily.   Yes [provider]  magnesium oxide (MAG-OX) 400 MG tablet Take 400 mg by mouth 2 (two) times daily.   Yes [provider]  metoprolol succinate (TOPROL-XL) 50 MG 24 hr tablet Take 50 mg by mouth in the morning. Take with or immediately following a meal.   Yes [provider]  mirtazapine (REMERON) 7.5 mg TABS tablet Take 15 mg by  mouth at bedtime.   Yes [provider]  Multiple Vitamins-Minerals (CENTRUM SILVER 50+WOMEN) TABS Take 1 tablet by mouth daily with breakfast.   Yes [provider]  potassium chloride (KLOR-CON) 20 MEQ packet Take 20-40 mEq by mouth See admin instructions. Take 20-40 mEq by mouth up to two times a day, AS DIRECTED, only when taking Torsemide   Yes [provider]  spironolactone (ALDACTONE) 25 MG tablet Take 25 mg by mouth daily.   Yes [provider]  torsemide (DEMADEX) 10 MG tablet Take 20-30 mg by mouth See admin instructions. Take 20-30 mg by mouth once daily, AS DIRECTED- based upon either overnight weight gain or weekly weight gain   Yes [provider]  vancomycin (VANCOCIN) 5000 MG injection Inject 1 g into the vein daily at 6 PM. Last dose is on 03/06/2021 03/01/21 03/06/21 Yes [provider]  vitamin B-12 (CYANOCOBALAMIN) 1000 MCG tablet Take 1,000 mcg by mouth daily.   Yes [provider]  ezetimibe (ZETIA) 10 MG tablet Take 0.5 tablets (5 mg total) by mouth daily. Patient not taking: Reported on 03/03/2021 08/23/15   Minna Merritts, MD  simvastatin (ZOCOR) 40 MG tablet Take 1 tablet (40 mg total) by mouth every evening. Patient not taking: Reported on 03/03/2021 04/09/12 02/07/18  Minna Merritts, MD    Inpatient Medications: Scheduled Meds: .  stroke: mapping our early stages of recovery book   Does not apply Once  . [START ON 03/04/2021] amiodarone  100 mg Oral q AM  . apixaban  5 mg Oral Q12H  . [START ON 03/04/2021] aspirin EC  81 mg Oral q AM  . atorvastatin  40 mg Oral QHS  . [START ON 03/04/2021] empagliflozin  10 mg Oral Daily  . insulin aspart  0-15 Units Subcutaneous TID WC  . magnesium oxide  400 mg Oral BID  . metoprolol tartrate  50 mg Oral BID  . mirtazapine  15 mg Oral QHS  . [START ON 03/04/2021] multivitamin with minerals  1 tablet Oral Q breakfast  . [START ON 03/04/2021] spironolactone  25 mg Oral  Daily  . [START ON 03/04/2021] vitamin B-12  1,000 mcg Oral Daily   Continuous Infusions: . vancomycin HCl     PRN Meds: acetaminophen **OR** acetaminophen (TYLENOL) oral liquid 160 mg/5 mL **OR** acetaminophen, metoprolol tartrate, senna-docusate  Allergies:    Allergies  Allergen Reactions  . Wound Dressing Adhesive Other (See Comments)    Patient has VERY thin skin on her  LOWER EXTREMITIES, use IV3000 and when removing dressings/bandages, use anti-adhesive spray!  . Codeine Rash    Social History:   Social History   Socioeconomic History  . Marital status: Married    Spouse name: Not on file  . Number of children: Not on file  . Years of education: Not on file  . Highest education level: Not on file  Occupational History  . Occupation: Secretary/administrator  Tobacco Use  . Smoking status: Former Smoker    Packs/day: 1.00    Years: 25.00    Pack years: 25.00    Types: Cigarettes    Quit date: 02/25/1979    Years since quitting: 42.0  . Smokeless tobacco: Never Used  Vaping Use  . Vaping Use: Never used  Substance and Sexual Activity  . Alcohol use: No    Alcohol/week: 0.0 standard drinks  . Drug use: No  . Sexual activity: Not on file  Other Topics Concern  . Not on file  Social History Narrative   Married with 3 children   1 daughter, Miranda Manning, is a Marine scientist at [employer]   Husband, Miranda Manning, has had mitral valve replacement   Social Determinants of Health   Financial Resource Strain: Not on file  Food Insecurity: Not on file  Transportation Needs: Not on file  Physical Activity: Not on file  Stress: Not on file  Social Connections: Not on file  Intimate Partner Violence: Not on file    Family History:    Family History  Problem Relation Age of Onset  . Hypertension Mother   . Rheum arthritis Mother   . Heart attack Father 43  . COPD Father   . Allergies Son   . Heart disease Brother        before age 25  . Breast cancer Paternal Aunt    . Colon cancer Neg Hx   . Stomach cancer Neg Hx      ROS:  Please see the history of present illness.  All other ROS reviewed and negative.     Physical Exam/Data:   Vitals:   03/03/21 1300 03/03/21 1315 03/03/21 1330 03/03/21 1700  BP: 115/61 128/66 (!) 71/56 117/68  Pulse: (!) 106 (!) 107 (!) 104 70  Resp: 19 (!) 26 20 17   Temp:      TempSrc:      SpO2: 99% 99% 98% 100%  Weight:      Height:       No intake or output data in the 24 hours ending 03/03/21 1757 Last 3 Weights 03/03/2021 03/03/2021 09/21/2020  Weight (lbs) 119 lb 0.8 oz 119 lb 0.8 oz 130 lb  Weight (kg) 54 kg 54 kg 58.968 kg     Body mass index is 23.25 kg/m.  General: 75 y.o. female resting comfortably in no acute distress. HEENT: Normocephalic and atraumatic. Sclera clear.  Neck: Supple. No carotid bruits. Possible elevated JVD. Heart: Irregular rhythm with normal rate. No murmurs, gallops, or rubs. Radial and distal pedal pulses 2+ and equal bilaterally. Lungs: No increased work of breathing. Clear to ausculation bilaterally. No wheezes, rhonchi, or rales.  Abdomen: Soft, non-distended, and non-tender to palpation. Bowel sounds present. MSK: Normal strength and tone for age. Extremities: Trace to 1+ pitting edema bilaterally edema.    Skin: Warm and dry. Neuro: No focal deficits. Psych: Normal affect. Responds appropriately.  EKG:  The EKG was personally reviewed and demonstrates:  Ventricular paced with underlying atrial flutter.   Telemetry:  Telemetry was personally reviewed and demonstrates:  Ventricular paced rhythm with underlying atrial flutter. Did have about 1 minute run of VT.   Relevant CV Studies: Echo pending.  Laboratory Data:  High Sensitivity Troponin:   Recent Labs  Lab 03/03/21 1110 03/03/21 1310  TROPONINIHS 24* 23*     Chemistry Recent Labs  Lab 03/03/21 1040 03/03/21 1049  NA 135 136  K 4.3 4.3  CL 102 104  CO2 25  --   GLUCOSE 209* 199*  BUN 21 25*  CREATININE  0.78 0.70  CALCIUM 10.2  --   GFRNONAA >60  --   ANIONGAP 8  --     Recent Labs  Lab 03/03/21 1040  PROT 6.2*  ALBUMIN 3.4*  AST 32  ALT 27  ALKPHOS 79  BILITOT 0.6   Hematology Recent Labs  Lab 03/03/21 1040 03/03/21 1049  WBC 7.7  --   RBC 4.21  --   HGB 10.9* 11.2*  HCT 36.5 33.0*  MCV 86.7  --   MCH 25.9*  --   MCHC 29.9*  --   RDW 20.0*  --   PLT 248  --    BNPNo results for input(s): BNP, PROBNP in the last 168 hours.  DDimer No results for input(s): DDIMER in the last 168 hours.   Radiology/Studies:  CT ANGIO HEAD W OR WO CONTRAST  Result Date: 03/03/2021 CLINICAL DATA:  Acute neuro deficit. Right-sided weakness and facial droop EXAM: CT ANGIOGRAPHY HEAD AND NECK TECHNIQUE: Multidetector CT imaging of the head and neck was performed using the standard protocol during bolus administration of intravenous contrast. Multiplanar CT image reconstructions and MIPs were obtained to evaluate the vascular anatomy. Carotid stenosis measurements (when applicable) are obtained utilizing NASCET criteria, using the distal internal carotid diameter as the denominator. CONTRAST:  73mL OMNIPAQUE IOHEXOL 350 MG/ML SOLN COMPARISON:  CT head 03/03/2021 FINDINGS: CTA NECK FINDINGS Aortic arch: Extensive atherosclerotic calcification aortic arch without aneurysm. Atherosclerotic calcification in the proximal great vessels. 75% diameter stenosis proximal left subclavian artery. Right carotid system: Mild atherosclerotic calcification right common carotid artery without stenosis. Postop right carotid endarterectomy which is widely patent. Focal calcification within the right internal carotid artery approximately 2 cm below the skull base. This has an unusual appearance in that there is no surrounding plaque. This appears to be intraluminal in location and is causing significant stenosis. There is minimal opacified lumen adjacent to the calcification. Estimated 80% diameter stenosis. Left carotid  system: Extensive atherosclerotic calcification throughout the left common carotid artery with multiple areas of 50% diameter stenosis. Extensive atherosclerotic calcification left carotid bifurcation and proximal left internal carotid artery. Approximately 60% diameter stenosis distal left common carotid artery and 70% diameter stenosis proximal left internal carotid artery. Vertebral arteries: Left vertebral artery dominant and patent to the basilar. Mild stenosis distal left vertebral artery Occlusion of the proximal right vertebral artery which reconstitutes and is patent to the basilar. Moderate to severe calcific stenosis at the C1 level on the right. Skeleton: Cervical spondylosis.  No acute skeletal abnormality. Other neck: No soft tissue mass.  Prominent jugular vein bilaterally Upper chest: Severe apical emphysema bilaterally. No pneumothorax or infiltrate. Precarinal lymph nodes up to 15 mm in diameter. AP window lymph node 11 mm in diameter. Review of the MIP images confirms the above findings CTA HEAD FINDINGS Anterior circulation: Extensive atherosclerotic disease throughout the cavernous carotid bilaterally with moderate stenosis bilaterally. Anterior and middle cerebral arteries patent bilaterally without large vessel occlusion or  significant stenosis. Posterior circulation: Mild stenosis distal left vertebral artery moderate to severe stenosis distal right vertebral artery. Both vertebral arteries patent to the basilar. Basilar is patent. Superior cerebellar and posterior cerebral arteries patent bilaterally. Fetal origin right posterior cerebral artery. Venous sinuses: Normal venous enhancement Anatomic variants: None Review of the MIP images confirms the above findings IMPRESSION: 1. Negative for intracranial large vessel occlusion 2. Advanced atherosclerotic disease. Atherosclerotic aortic arch. 75% diameter stenosis proximal left subclavian artery 3. Right carotid endarterectomy widely patent.  Focal calcific stenosis distal right internal carotid artery approximately 80% diameter stenosis. This could be an embolized calcific fragment versus atherosclerotic disease. Moderate stenosis right cavernous carotid artery. 4. Extensive atherosclerotic disease throughout the left carotid artery. Approximately 60% diameter stenosis distal left common carotid artery and 70% diameter stenosis proximal left internal carotid artery. Moderate stenosis left cavernous carotid artery. 5. 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 Mild stenosis distal left vertebral artery. 6. Severe apical emphysema. Electronically Signed   By: Franchot Gallo M.D.   On: 03/03/2021 11:25   CT ANGIO NECK W OR WO CONTRAST  Result Date: 03/03/2021 CLINICAL DATA:  Acute neuro deficit. Right-sided weakness and facial droop EXAM: CT ANGIOGRAPHY HEAD AND NECK TECHNIQUE: Multidetector CT imaging of the head and neck was performed using the standard protocol during bolus administration of intravenous contrast. Multiplanar CT image reconstructions and MIPs were obtained to evaluate the vascular anatomy. Carotid stenosis measurements (when applicable) are obtained utilizing NASCET criteria, using the distal internal carotid diameter as the denominator. CONTRAST:  68mL OMNIPAQUE IOHEXOL 350 MG/ML SOLN COMPARISON:  CT head 03/03/2021 FINDINGS: CTA NECK FINDINGS Aortic arch: Extensive atherosclerotic calcification aortic arch without aneurysm. Atherosclerotic calcification in the proximal great vessels. 75% diameter stenosis proximal left subclavian artery. Right carotid system: Mild atherosclerotic calcification right common carotid artery without stenosis. Postop right carotid endarterectomy which is widely patent. Focal calcification within the right internal carotid artery approximately 2 cm below the skull base. This has an unusual appearance in that there is no surrounding plaque. This appears to be  intraluminal in location and is causing significant stenosis. There is minimal opacified lumen adjacent to the calcification. Estimated 80% diameter stenosis. Left carotid system: Extensive atherosclerotic calcification throughout the left common carotid artery with multiple areas of 50% diameter stenosis. Extensive atherosclerotic calcification left carotid bifurcation and proximal left internal carotid artery. Approximately 60% diameter stenosis distal left common carotid artery and 70% diameter stenosis proximal left internal carotid artery. Vertebral arteries: Left vertebral artery dominant and patent to the basilar. Mild stenosis distal left vertebral artery Occlusion of the proximal right vertebral artery which reconstitutes and is patent to the basilar. Moderate to severe calcific stenosis at the C1 level on the right. Skeleton: Cervical spondylosis.  No acute skeletal abnormality. Other neck: No soft tissue mass.  Prominent jugular vein bilaterally Upper chest: Severe apical emphysema bilaterally. No pneumothorax or infiltrate. Precarinal lymph nodes up to 15 mm in diameter. AP window lymph node 11 mm in diameter. Review of the MIP images confirms the above findings CTA HEAD FINDINGS Anterior circulation: Extensive atherosclerotic disease throughout the cavernous carotid bilaterally with moderate stenosis bilaterally. Anterior and middle cerebral arteries patent bilaterally without large vessel occlusion or significant stenosis. Posterior circulation: Mild stenosis distal left vertebral artery moderate to severe stenosis distal right vertebral artery. Both vertebral arteries patent to the basilar. Basilar is patent. Superior cerebellar and posterior cerebral arteries patent bilaterally. Fetal origin right posterior  cerebral artery. Venous sinuses: Normal venous enhancement Anatomic variants: None Review of the MIP images confirms the above findings IMPRESSION: 1. Negative for intracranial large vessel  occlusion 2. Advanced atherosclerotic disease. Atherosclerotic aortic arch. 75% diameter stenosis proximal left subclavian artery 3. Right carotid endarterectomy widely patent. Focal calcific stenosis distal right internal carotid artery approximately 80% diameter stenosis. This could be an embolized calcific fragment versus atherosclerotic disease. Moderate stenosis right cavernous carotid artery. 4. Extensive atherosclerotic disease throughout the left carotid artery. Approximately 60% diameter stenosis distal left common carotid artery and 70% diameter stenosis proximal left internal carotid artery. Moderate stenosis left cavernous carotid artery. 5. 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 Mild stenosis distal left vertebral artery. 6. Severe apical emphysema. Electronically Signed   By: Franchot Gallo M.D.   On: 03/03/2021 11:25   DG Chest Port 1 View  Result Date: 03/03/2021 CLINICAL DATA:  Left-sided facial droop and right arm weakness. History of pulmonary fibrosis and emphysema. EXAM: PORTABLE CHEST 1 VIEW COMPARISON:  September 14, 2014 FINDINGS: A multi lead AICD is present. Multiple sternal wires are seen. Chronic appearing increased interstitial lung markings are noted. There is no evidence of acute infiltrate, pleural effusion or pneumothorax. The cardiac silhouette is moderately enlarged. Marked severity calcification of the aortic arch is seen. No acute osseous abnormalities are seen. IMPRESSION: Cardiomegaly and chronic interstitial lung disease, without evidence of acute or active cardiopulmonary disease. Electronically Signed   By: Virgina Norfolk M.D.   On: 03/03/2021 15:27   CT HEAD CODE STROKE WO CONTRAST  Result Date: 03/03/2021 CLINICAL DATA:  Code stroke. Acute neuro deficit. Right-sided weakness, facial droop EXAM: CT HEAD WITHOUT CONTRAST TECHNIQUE: Contiguous axial images were obtained from the base of the skull through the vertex  without intravenous contrast. COMPARISON:  None. FINDINGS: Brain: No evidence of acute infarction, hemorrhage, hydrocephalus, extra-axial collection or mass lesion/mass effect. Mild white matter hypodensity bilaterally in the frontal lobes, symmetric and appearing chronic. Vascular: Negative for hyperdense vessel. Atherosclerotic calcification in the cavernous carotid bilaterally and in the distal vertebral artery bilaterally. Skull: Negative Sinuses/Orbits: Mild mucosal edema ethmoid sinuses bilaterally otherwise clear. Negative orbit Other: None ASPECTS ([place] Stroke Program Early CT Score) - Ganglionic level infarction (caudate, lentiform nuclei, internal capsule, insula, M1-M3 cortex): 7 - Supraganglionic infarction (M4-M6 cortex): 3 Total score (0-10 with 10 being normal): 10 IMPRESSION: 1. No acute intracranial abnormality 2. ASPECTS is 10 3. Mild chronic microvascular ischemic change in the white matter. 4. Code stroke imaging results were communicated on 03/03/2021 at 10:50 am to provider Bhagat via text page Electronically Signed   By: Franchot Gallo M.D.   On: 03/03/2021 10:51     Assessment and Plan:   Persistent Atrial Flutter Atrial Fibrillation Complete Heart Block s/p BiV PPM - Patient developed post-op atrial fibrillation/flutter following CABG/MVR in 11/2020. Since then she has had DCCV x3 and is on Amiodarone. Presented with TIA. ICD interrogation revealed atrial rate >300 bpm with 3-1 conduction. Patient follows at [facility] for her cardiac care and plan is to transfer her to [facility] when a bed becomes available for possible ablation.  - Potassium 4.3.  - Will check Magnesium. - Will check TSH. - Home Toprol-LX has already been increased to 50mg  twice daily. Agree with this. - Will increase Amiodarone to 200mg  daily. - Continue Eliquis 5mg  twice daily.   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 LVEF dropped to 30% at time of  atrial fibrillation with RVR  diagnosis but improved to 50% on last Echo in 01/2021. Patient takes PRN Torsemide at home for weight gain and has needed Torsemide 5/7 days this past week. - No overt edema on chest x-ray. - Will check BNP.  - Echo pending. - She looks slightly volume overloaded on exam. - Will give one dose of IV Lasix 40mg  daily. - Continue Toprol as above. - Continue home Spironolactone 25mg  daily and Jardiance. - Home ACEi was held on admission. Can restart if BP allows. - Continue to monitor volume status closely.  CAD s/p CABG  - S/p CABG x3 in 11/2020.  - High-sensitivity troponin minimally elevated and flat in the 20s. Not consistent with ACS. Likely secondary to TIA and atrial flutter. - No angina.  - Continue aspirin, beta-blocker, statin.  Severe MR s/p MVR at time of CABG - Repeat echo pending. - Followed at [facility].  TIA - Possible related to severe PVD. Head CT showed no acute intracranial findings. - Continue aspirin and Eliquis. - Continue statin.  Otherwise, per primary team: - Diabetes  - MRSA bacteremia - Dementia  Risk Assessment/Risk Scores:   New York Heart Association (NYHA) Functional Class NYHA Class III. I think severe deconditioning is contributing to this.  CHA2DS2-VASc Score = 9  This indicates a 12.2% annual risk of stroke. The patient's score is based upon: CHF History: Yes HTN History: Yes Diabetes History: Yes Stroke History: Yes Vascular Disease History: Yes Age Score: 2 Gender Score: 1    For questions or updates, please contact [place] Please consult [email or web address] for contact info under    Signed, Darreld Mclean, PA-C  03/03/2021 5:57 PM     Patient seen and examined. Agree with assessment and plan.  Miranda Manning is a very pleasant 75 year old female who has had a difficult course since December 2021.  She is followed at [facility] by Dr. Domingo Cocking.  She has peripheral vascular disease and is status post remote right carotid endarterectomy  approximately 15 years ago and has subclavian disease.  In December 2021 she underwent CABG surgery x3 with a LIMA to the LAD, SVG to OM, and SVG to PD as well as mitral valve annuloplasty and tissue MVR with a 27 mm Epic Biocor for MR.  Her course was complicated by complete heart block requiring pacemaker insertion.  Subsequently she developed episodes of recurrent atrial fibrillation flutter with RVR and an echo Doppler study revealed reduced LV function with EF at approximately 30% felt most likely due to tachycardia induced she initially was loaded with amiodarone and underwent DC cardioversion but unfortunately has had several recurrences of atrial fib/flutter.  Subsequent course was further complicated by development of septic shock with MRSA bacteremia, TEE was negative for endocarditis but she required removal of her permanent pacemaker and subsequently has had repeat pacemaker insertion.  She continues to be on vancomycin therapy via Port-A-Cath.  Today she developed worrisome symptoms of possible stroke with left-sided facial droop and right arm weakness.  No acute intracranial findings were demonstrated.  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  Is presenting to the hospital, her neurologic symptoms have resolved using concern for possible TIA and etiology of today's events.  Is only she is not  having any chest pain.  Her rhythm is showing underlying atrial flutter with intermittent appropriate V pacing.  Presently, she appears comfortable.  She is not having any chest pain or significant shortness of breath.  HEENT is unremarkable.  There is no JVD.  She has left basilar crackles greater than right basilar crackles.  There is no chest wall tenderness.  Rhythm is  irregular rate in the 80s.  I did not appreciate any residual MR.  Abdomen was soft nontender.  There are venous stasis changes to her legs bilaterally with mild edema.  No significant neurologic findings presently.  She has normal affect.  She is anticoagulated on normal dose Eliquis at 5 mg twice a day.  High-sensitivity troponins are 24 and 23, not consistent with ACS.  At present, patient is awaiting for bed at [facility] and will be transferred.  In the interim, would give a dose of IV furosemide 40 mg and increase amiodarone to at least 200 mg daily with her ongoing atrial flutter.  Her beta-blocker regimen has also been increased.  She will be continuing with her antibiotic therapy via Port-A-Cath as prescribed by the hospitalist team.  Most recent ejection fraction was 50% at [facility].  Our cardiology group will see patient until her transfer to [facility], MD, [facility] 03/03/2021 5:59 PM

## 2021-03-04 ENCOUNTER — Inpatient Hospital Stay (HOSPITAL_COMMUNITY): Payer: PPO

## 2021-03-04 DIAGNOSIS — R7881 Bacteremia: Secondary | ICD-10-CM | POA: Diagnosis not present

## 2021-03-04 DIAGNOSIS — I639 Cerebral infarction, unspecified: Secondary | ICD-10-CM

## 2021-03-04 DIAGNOSIS — B9562 Methicillin resistant Staphylococcus aureus infection as the cause of diseases classified elsewhere: Secondary | ICD-10-CM

## 2021-03-04 DIAGNOSIS — Z951 Presence of aortocoronary bypass graft: Secondary | ICD-10-CM

## 2021-03-04 DIAGNOSIS — Z952 Presence of prosthetic heart valve: Secondary | ICD-10-CM

## 2021-03-04 LAB — COMPREHENSIVE METABOLIC PANEL
ALT: 28 U/L (ref 0–44)
AST: 33 U/L (ref 15–41)
Albumin: 3.2 g/dL — ABNORMAL LOW (ref 3.5–5.0)
Alkaline Phosphatase: 71 U/L (ref 38–126)
Anion gap: 9 (ref 5–15)
BUN: 19 mg/dL (ref 8–23)
CO2: 24 mmol/L (ref 22–32)
Calcium: 10.1 mg/dL (ref 8.9–10.3)
Chloride: 101 mmol/L (ref 98–111)
Creatinine, Ser: 0.73 mg/dL (ref 0.44–1.00)
GFR, Estimated: >60 mL/min (ref >60)
Glucose, Bld: 100 mg/dL — ABNORMAL HIGH (ref 70–99)
Potassium: 3.3 mmol/L — ABNORMAL LOW (ref 3.5–5.1)
Sodium: 134 mmol/L — ABNORMAL LOW (ref 135–145)
Total Bilirubin: 1.1 mg/dL (ref 0.3–1.2)
Total Protein: 5.9 g/dL — ABNORMAL LOW (ref 6.5–8.1)

## 2021-03-04 LAB — CBC WITH DIFFERENTIAL/PLATELET
Abs Immature Granulocytes: 0.02 10*3/uL (ref 0.00–0.07)
Basophils Absolute: 0 10*3/uL (ref 0.0–0.1)
Basophils Relative: 0 %
Eosinophils Absolute: 0.1 10*3/uL (ref 0.0–0.5)
Eosinophils Relative: 2 %
HCT: 33.9 % — ABNORMAL LOW (ref 36.0–46.0)
Hemoglobin: 10.6 g/dL — ABNORMAL LOW (ref 12.0–15.0)
Immature Granulocytes: 0 %
Lymphocytes Relative: 13 %
Lymphs Abs: 0.9 10*3/uL (ref 0.7–4.0)
MCH: 26 pg (ref 26.0–34.0)
MCHC: 31.3 g/dL (ref 30.0–36.0)
MCV: 83.3 fL (ref 80.0–100.0)
Monocytes Absolute: 0.7 10*3/uL (ref 0.1–1.0)
Monocytes Relative: 10 %
Neutro Abs: 5.5 10*3/uL (ref 1.7–7.7)
Neutrophils Relative %: 75 %
Platelets: 287 10*3/uL (ref 150–400)
RBC: 4.07 MIL/uL (ref 3.87–5.11)
RDW: 20.4 % — ABNORMAL HIGH (ref 11.5–15.5)
WBC: 7.3 10*3/uL (ref 4.0–10.5)
nRBC: 0 % (ref 0.0–0.2)

## 2021-03-04 LAB — GLUCOSE, CAPILLARY
Glucose-Capillary: 111 mg/dL — ABNORMAL HIGH (ref 70–99)
Glucose-Capillary: 99 mg/dL (ref 70–99)
Glucose-Capillary: 113 mg/dL — ABNORMAL HIGH (ref 70–99)
Glucose-Capillary: 95 mg/dL (ref 70–99)

## 2021-03-04 LAB — ECHOCARDIOGRAM COMPLETE BUBBLE STUDY
AR max vel: 1.75 cm2
AV Area VTI: 1.59 cm2
AV Area mean vel: 1.66 cm2
AV Mean grad: 3.8 mmHg
AV Peak grad: 6.1 mmHg
Ao pk vel: 1.23 m/s
Calc EF: 31.7 %
S' Lateral: 3.8 cm
Single Plane A2C EF: 30.7 %
Single Plane A4C EF: 32.2 %

## 2021-03-04 LAB — LIPID PANEL
Cholesterol: 111 mg/dL (ref 0–200)
HDL: 36 mg/dL — ABNORMAL LOW (ref >40)
LDL Cholesterol: 58 mg/dL (ref 0–99)
Total CHOL/HDL Ratio: 3.1 RATIO
Triglycerides: 83 mg/dL (ref <150)
VLDL: 17 mg/dL (ref 0–40)

## 2021-03-04 LAB — HEMOGLOBIN A1C
Hgb A1c MFr Bld: 6.9 % — ABNORMAL HIGH (ref 4.8–5.6)
Mean Plasma Glucose: 151.33 mg/dL

## 2021-03-04 MED ORDER — APIXABAN 5 MG PO TABS
5.0000 mg | ORAL_TABLET | Freq: Two times a day (BID) | ORAL | Status: DC
  Administered 2021-03-04: 5 mg via ORAL
  Filled 2021-03-04: qty 1

## 2021-03-04 MED ORDER — AMIODARONE HCL 200 MG PO TABS: 200.0000 mg | ORAL_TABLET | Freq: Every morning | ORAL | Status: DC

## 2021-03-04 MED ORDER — POTASSIUM CHLORIDE CRYS ER 20 MEQ PO TBCR
40.0000 meq | EXTENDED_RELEASE_TABLET | Freq: Once | ORAL | Status: AC
  Administered 2021-03-04: 40 meq via ORAL
  Filled 2021-03-04: qty 2

## 2021-03-04 MED ORDER — SODIUM CHLORIDE 0.9% FLUSH
10.0000 mL | Freq: Two times a day (BID) | INTRAVENOUS | Status: DC
  Administered 2021-03-04 (×2): 10 mL

## 2021-03-04 MED ORDER — HEPARIN (PORCINE) 25000 UT/250ML-% IV SOLN: 700.0000 [IU]/h | INTRAVENOUS | Status: DC

## 2021-03-04 MED ORDER — HEPARIN (PORCINE) 25000 UT/250ML-% IV SOLN
700.0000 [IU]/h | INTRAVENOUS | Status: DC
  Filled 2021-03-04: qty 250

## 2021-03-04 MED ORDER — METOPROLOL TARTRATE 50 MG PO TABS: 50.0000 mg | ORAL_TABLET | Freq: Two times a day (BID) | ORAL | Status: DC

## 2021-03-04 NOTE — Progress Notes (Signed)
PROGRESS NOTE    Miranda Manning  JTT:[id number] DOB: 11/11/1946 DOA: 03/03/2021 PCP: Maryland Pink, MD   Chief Complaint  Patient presents with  . Code Stroke    Brief Narrative:  Miranda Manning is Miranda Manning 75 y.o. female with medical history significant of CAD s/p CABG x 3 (11/2020), severe mitral regurgitation status post mitral mitral valve replacement (11/2020), ischemic cardiomyopathy (LVEF 30-35%), paroxysmal Miranda Brickell. fib on Eliquis, third-degree AV block status post pacemaker, MRSA bacteremia on Vancomycin (last day 03/06/21), IIDM,  who presented with TIA symptoms.  Her daughter notes R sided weakness and dysarthria prior to presentation.  Symptoms lasted about 45 minutes.  She's now back to her baseline (though she notes she's had somewhat of Miranda Schoolfield decline in her cognition since the CABG/MVR in 11/2020).   Head CT was negative for acute intracranial abnormality on presentation.  Vallie Teters CTA head/neck was negative for intracranial LVO.  Patient was unable to get an MRI brain due to recent pacemaker placement.  Neurology was consulted and follow.  Echo was performed and was concerning for valve obstruction.  Planning for TEE at this point.  Daughter desires to transfer to [facility] as most of her mother's care is there.    Assessment & Plan:   Active Problems:   TIA (transient ischemic attack)  TIA -CT head without acute abnormality -CTA head/neck with advanced atherosclerotic disease, atherosclerotic aortic arch.  75% diameter stenosis proximal L subclavian artery.  R carotid endarterectomy widely patent.  Focal calcific stenosis distal R internal carotid artery approximately 80% diametere stenosis.  This could be embolized calcific fragment vs atherosclerotic disease.  Moderate stenosis right cavernous carotid artery.  Extensive atherosclerotic disease throught L carotid artery.  Approximately 6% diameter stenosis distal L common carotid artery and 70% diameter stenosis proximal L internal carotid artery.   Moderate stenosis L cavernous carotid artery.  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  Mild stenosis distal L vertebral arteryl -carotid US with right carotid with 1-39% stenosis , non hemodynamically significant plaque <50% in CCA.  Left carotid with 40-59% stenosis in left ICA, non hemodynamically significant plaque <50% in CCA.  Antegrade flow in R vertebral artery (biphasic waveform).  Right subclavian artery stenotic.  L subclavian flow disturbed.  - echo with findings concerning for valve obstruction (see report -difficult with tachycardia), EF 45%, RVSF mildly reduced (see report) - continue aspirin.  Transition to heparin gtt.  - LDL 58, A1c 6.9  -Unable to perform MRI due to Miranda Manning new insert temporary CRT (old PPM was removed 5 weeks ago due to persistent MRSA bacteremia) -PT OT, speech evaluation  Miranda Manning flutter, chronic, rate uncontrolled - cardiology consult, appreciate recs - ICD interrogation reviewed atrial rate 300 bpm with 3 to 1 conduction  - ? Plan for ablation, though in the setting of her recent TIA, will discuss whether this needs to be postponed - continue metoprolol and amiodarone per cardiology  MRSA bacteremia -Continue vancomycin, last dose will be 3/14 -no fevers, signs/symptoms infection - follow TEE  IIDM with hyperglycemia -Add sliding scale -jardiance  Chronic systolic CHF/ischemic cardiomyopathy -Appears to be euvolemic, continue torsemide, spironolactone -ace currently on hold  Hypertension holding ace for now - continuing torsemide/spironolactone for now  Called to discuss with cardiology at [facility], awaiting call back.  DVT prophylaxis: heparin Code Status: full  Family Communication: daughter over phone Disposition:   Status is: Inpatient  Remains inpatient appropriate because:Inpatient level of care appropriate due  to severity of illness   Dispo: The patient is from: Home              Anticipated  d/c is to: pending              Patient currently is not medically stable to d/c.   Difficult to place patient No   Consultants:   Neurology  cardiology  Procedures:  Echo IMPRESSIONS    1. The mitral valve has been repaired/replaced. Trivial mitral valve  regurgitation. The mean mitral valve gradient is 7.5 mmHg with average  heart rate of 97 bpm. There is Miranda Villella 27 mm St. Jude bioprosthetic valve  present in the mitral position. Procedure  Date: 11/2020. E velocity 2.3 m/s, PHT 70 msec, TVI ratio 2.3, EOA 1.33  cm2. There is also Miranda Manning mobile, likely post-op transected chord seen in LV.  In the setting of stroke, consider TEE to further evaluate mitral  prosthesis. Elevated E velocity and mean  gradient may represent valve obstruction however in setting of tachycardia  challenging to discern.  2. Left ventricular ejection fraction, by estimation, is 45%. The left  ventricle has mildly decreased function. The left ventricle demonstrates  global hypokinesis. There is mild left ventricular hypertrophy. Left  ventricular diastolic function could not  be evaluated.  3. Right ventricular systolic function is mildly reduced. The right  ventricular size is mildly enlarged. There is mildly elevated pulmonary  artery systolic pressure. The estimated right ventricular systolic  pressure is 94.7 mmHg.  4. Left atrial size was mild to moderately dilated.  5. The aortic valve is abnormal. There is moderate calcification of the  aortic valve. Aortic valve regurgitation is trivial. Mild aortic valve  sclerosis is present, with no evidence of aortic valve stenosis.  6. The inferior vena cava is normal in size with <50% respiratory  variability, suggesting right atrial pressure of 8 mmHg.  7. Agitated saline contrast bubble study was negative, with no evidence  of any interatrial shunt.   Carotid US Summary:  Right Carotid: Velocities in the right ICA are consistent with Aristotle Lieb 1-39%   stenosis.         Non-hemodynamically significant plaque <50% noted in the  CCA.   Left Carotid: Velocities in the left ICA are consistent with Victorious Kundinger 40-59%  stenosis.        Non-hemodynamically significant plaque <50% noted in the  CCA.   Vertebrals: Right vertebral artery demonstrates antegrade flow. Bunny Ear        (biphasic) waveform throughout.  Subclavians: Right subclavian artery was stenotic. Left subclavian artery  flow        was disturbed.   Antimicrobials:  Anti-infectives (From admission, onward)   Start     Dose/Rate Route Frequency Ordered Stop   03/03/21 1800  vancomycin (VANCOREADY) IVPB 1000 mg/200 mL       Note to Pharmacy: Last dose is on 03/06/2021     1,000 mg 200 mL/hr over 60 Minutes Intravenous Daily-1800 03/03/21 1401 03/06/21 1800     Subjective: No new complaints Doesn't remember what happened  Objective: Vitals:   03/04/21 0922 03/04/21 0933 03/04/21 1202 03/04/21 1557  BP: 104/76  131/86 129/60  Pulse:  83 81 69  Resp:   18 18  Temp:   97.7 F (36.5 C) 97.9 F (36.6 C)  TempSrc:   Axillary Oral  SpO2:   96% 93%  Weight:      Height:        Intake/Output  Summary (Last 24 hours) at 03/04/2021 1701 Last data filed at 03/04/2021 0930 Gross per 24 hour  Intake 699 ml  Output --  Net 699 ml   Filed Weights   03/03/21 1000 03/03/21 1043  Weight: 54 kg 54 kg    Examination:  General exam: Appears calm and comfortable  Respiratory system: Clear to auscultation. Respiratory effort normal. Cardiovascular system: S1 & S2 heard, RRR. Gastrointestinal system: Abdomen is nondistended, soft and nontender. Central nervous system: Alert and oriented. CN 2-12 intact.  Symmetric strength. Extremities: no LEE Skin: No rashes, lesions or ulcers Psychiatry: Judgement and insight appear normal. Mood & affect appropriate.     Data Reviewed: I have personally reviewed following labs and imaging studies  CBC: Recent  Labs  Lab 03/03/21 1040 03/03/21 1049 03/04/21 1243  WBC 7.7  --  7.3  NEUTROABS 6.1  --  5.5  HGB 10.9* 11.2* 10.6*  HCT 36.5 33.0* 33.9*  MCV 86.7  --  83.3  PLT 248  --  852    Basic Metabolic Panel: Recent Labs  Lab 03/03/21 1040 03/03/21 1049 03/03/21 1830 03/04/21 1243  NA 135 136  --  134*  K 4.3 4.3  --  3.3*  CL 102 104  --  101  CO2 25  --   --  24  GLUCOSE 209* 199*  --  100*  BUN 21 25*  --  19  CREATININE 0.78 0.70  --  0.73  CALCIUM 10.2  --   --  10.1  MG  --   --  2.1  --     GFR: Estimated Creatinine Clearance: 43 mL/min (by C-G formula based on SCr of 0.73 mg/dL).  Liver Function Tests: Recent Labs  Lab 03/03/21 1040 03/04/21 1243  AST 32 33  ALT 27 28  ALKPHOS 79 71  BILITOT 0.6 1.1  PROT 6.2* 5.9*  ALBUMIN 3.4* 3.2*    CBG: Recent Labs  Lab 03/03/21 1641 03/03/21 2116 03/04/21 0630 03/04/21 1203 03/04/21 1645  GLUCAP 122* 89 111* 99 113*     Recent Results (from the past 240 hour(s))  Resp Panel by RT-PCR (Flu Elijan Googe&B, Covid) Nasopharyngeal Swab     Status: None   Collection Time: 03/03/21  2:07 PM   Specimen: Nasopharyngeal Swab; Nasopharyngeal(NP) swabs in vial transport medium  Result Value Ref Range Status   SARS Coronavirus 2 by RT PCR NEGATIVE NEGATIVE Final    Comment: (NOTE) SARS-CoV-2 target nucleic acids are NOT DETECTED.  The SARS-CoV-2 RNA is generally detectable in upper respiratory specimens during the acute phase of infection. The lowest concentration of SARS-CoV-2 viral copies this assay can detect is 138 copies/mL. Miranda Manning negative result does not preclude SARS-Cov-2 infection and should not be used as the sole basis for treatment or other patient management decisions. Miranda Manning negative result may occur with  improper specimen collection/handling, submission of specimen other than nasopharyngeal swab, presence of viral mutation(s) within the areas targeted by this assay, and inadequate number of viral copies(<138  copies/mL). Miranda Manning negative result must be combined with clinical observations, patient history, and epidemiological information. The expected result is Negative.  Fact Sheet for Patients:  EntrepreneurPulse.com.au  Fact Sheet for Healthcare Providers:  IncredibleEmployment.be  This test is no t yet approved or cleared by the [place] FDA and  has been authorized for detection and/or diagnosis of SARS-CoV-2 by FDA under an Emergency Use Authorization (EUA). This EUA will remain  in effect (meaning this test can be used)  for the duration of the COVID-19 declaration under Section 564(b)(1) of the Act, 21 U.S.C.section 360bbb-3(b)(1), unless the authorization is terminated  or revoked sooner.       Influenza Sibbie Flammia by PCR NEGATIVE NEGATIVE Final   Influenza B by PCR NEGATIVE NEGATIVE Final    Comment: (NOTE) The Xpert Xpress SARS-CoV-2/FLU/RSV plus assay is intended as an aid in the diagnosis of influenza from Nasopharyngeal swab specimens and should not be used as Tyren Dugar sole basis for treatment. Nasal washings and aspirates are unacceptable for Xpert Xpress SARS-CoV-2/FLU/RSV testing.  Fact Sheet for Patients: EntrepreneurPulse.com.au  Fact Sheet for Healthcare Providers: IncredibleEmployment.be  This test is not yet approved or cleared by the [place] FDA and has been authorized for detection and/or diagnosis of SARS-CoV-2 by FDA under an Emergency Use Authorization (EUA). This EUA will remain in effect (meaning this test can be used) for the duration of the COVID-19 declaration under Section 564(b)(1) of the Act, 21 U.S.C. section 360bbb-3(b)(1), unless the authorization is terminated or revoked.  Performed at [facility], [place] [address] [place], [place] [address]          Radiology Studies: CT ANGIO HEAD W OR WO CONTRAST  Result Date: 03/03/2021 CLINICAL DATA:  Acute neuro deficit.  Right-sided weakness and facial droop EXAM: CT ANGIOGRAPHY HEAD AND NECK TECHNIQUE: Multidetector CT imaging of the head and neck was performed using the standard protocol during bolus administration of intravenous contrast. Multiplanar CT image reconstructions and MIPs were obtained to evaluate the vascular anatomy. Carotid stenosis measurements (when applicable) are obtained utilizing NASCET criteria, using the distal internal carotid diameter as the denominator. CONTRAST:  27mL OMNIPAQUE IOHEXOL 350 MG/ML SOLN COMPARISON:  CT head 03/03/2021 FINDINGS: CTA NECK FINDINGS Aortic arch: Extensive atherosclerotic calcification aortic arch without aneurysm. Atherosclerotic calcification in the proximal great vessels. 75% diameter stenosis proximal left subclavian artery. Right carotid system: Mild atherosclerotic calcification right common carotid artery without stenosis. Postop right carotid endarterectomy which is widely patent. Focal calcification within the right internal carotid artery approximately 2 cm below the skull base. This has an unusual appearance in that there is no surrounding plaque. This appears to be intraluminal in location and is causing significant stenosis. There is minimal opacified lumen adjacent to the calcification. Estimated 80% diameter stenosis. Left carotid system: Extensive atherosclerotic calcification throughout the left common carotid artery with multiple areas of 50% diameter stenosis. Extensive atherosclerotic calcification left carotid bifurcation and proximal left internal carotid artery. Approximately 60% diameter stenosis distal left common carotid artery and 70% diameter stenosis proximal left internal carotid artery. Vertebral arteries: Left vertebral artery dominant and patent to the basilar. Mild stenosis distal left vertebral artery Occlusion of the proximal right vertebral artery which reconstitutes and is patent to the basilar. Moderate to severe calcific stenosis at the  C1 level on the right. Skeleton: Cervical spondylosis.  No acute skeletal abnormality. Other neck: No soft tissue mass.  Prominent jugular vein bilaterally Upper chest: Severe apical emphysema bilaterally. No pneumothorax or infiltrate. Precarinal lymph nodes up to 15 mm in diameter. AP window lymph node 11 mm in diameter. Review of the MIP images confirms the above findings CTA HEAD FINDINGS Anterior circulation: Extensive atherosclerotic disease throughout the cavernous carotid bilaterally with moderate stenosis bilaterally. Anterior and middle cerebral arteries patent bilaterally without large vessel occlusion or significant stenosis. Posterior circulation: Mild stenosis distal left vertebral artery moderate to severe stenosis distal right vertebral artery. Both vertebral arteries patent to the basilar. Basilar is patent.  Superior cerebellar and posterior cerebral arteries patent bilaterally. Fetal origin right posterior cerebral artery. Venous sinuses: Normal venous enhancement Anatomic variants: None Review of the MIP images confirms the above findings IMPRESSION: 1. Negative for intracranial large vessel occlusion 2. Advanced atherosclerotic disease. Atherosclerotic aortic arch. 75% diameter stenosis proximal left subclavian artery 3. Right carotid endarterectomy widely patent. Focal calcific stenosis distal right internal carotid artery approximately 80% diameter stenosis. This could be an embolized calcific fragment versus atherosclerotic disease. Moderate stenosis right cavernous carotid artery. 4. Extensive atherosclerotic disease throughout the left carotid artery. Approximately 60% diameter stenosis distal left common carotid artery and 70% diameter stenosis proximal left internal carotid artery. Moderate stenosis left cavernous carotid artery. 5. 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 Mild stenosis distal left vertebral artery. 6. Severe apical  emphysema. Electronically Signed   By: Franchot Gallo M.D.   On: 03/03/2021 11:25   CT HEAD WO CONTRAST  Result Date: 03/03/2021 CLINICAL DATA:  Left facial droop EXAM: CT HEAD WITHOUT CONTRAST TECHNIQUE: Contiguous axial images were obtained from the base of the skull through the vertex without intravenous contrast. COMPARISON:  03/03/2021 FINDINGS: Brain: There is no mass, hemorrhage or extra-axial collection. The size and configuration of the ventricles and extra-axial CSF spaces are normal. There is hypoattenuation of the white matter, most commonly indicating chronic small vessel disease. Vascular: No abnormal hyperdensity of the major intracranial arteries or dural venous sinuses. No intracranial atherosclerosis. Skull: The visualized skull base, calvarium and extracranial soft tissues are normal. Sinuses/Orbits: No fluid levels or advanced mucosal thickening of the visualized paranasal sinuses. No mastoid or middle ear effusion. The orbits are normal. IMPRESSION: Chronic small vessel disease without acute intracranial abnormality. Electronically Signed   By: Ulyses Jarred M.D.   On: 03/03/2021 20:08   CT ANGIO NECK W OR WO CONTRAST  Result Date: 03/03/2021 CLINICAL DATA:  Acute neuro deficit. Right-sided weakness and facial droop EXAM: CT ANGIOGRAPHY HEAD AND NECK TECHNIQUE: Multidetector CT imaging of the head and neck was performed using the standard protocol during bolus administration of intravenous contrast. Multiplanar CT image reconstructions and MIPs were obtained to evaluate the vascular anatomy. Carotid stenosis measurements (when applicable) are obtained utilizing NASCET criteria, using the distal internal carotid diameter as the denominator. CONTRAST:  76mL OMNIPAQUE IOHEXOL 350 MG/ML SOLN COMPARISON:  CT head 03/03/2021 FINDINGS: CTA NECK FINDINGS Aortic arch: Extensive atherosclerotic calcification aortic arch without aneurysm. Atherosclerotic calcification in the proximal great vessels.  75% diameter stenosis proximal left subclavian artery. Right carotid system: Mild atherosclerotic calcification right common carotid artery without stenosis. Postop right carotid endarterectomy which is widely patent. Focal calcification within the right internal carotid artery approximately 2 cm below the skull base. This has an unusual appearance in that there is no surrounding plaque. This appears to be intraluminal in location and is causing significant stenosis. There is minimal opacified lumen adjacent to the calcification. Estimated 80% diameter stenosis. Left carotid system: Extensive atherosclerotic calcification throughout the left common carotid artery with multiple areas of 50% diameter stenosis. Extensive atherosclerotic calcification left carotid bifurcation and proximal left internal carotid artery. Approximately 60% diameter stenosis distal left common carotid artery and 70% diameter stenosis proximal left internal carotid artery. Vertebral arteries: Left vertebral artery dominant and patent to the basilar. Mild stenosis distal left vertebral artery Occlusion of the proximal right vertebral artery which reconstitutes and is patent to the basilar. Moderate to severe calcific stenosis at the C1 level on the  right. Skeleton: Cervical spondylosis.  No acute skeletal abnormality. Other neck: No soft tissue mass.  Prominent jugular vein bilaterally Upper chest: Severe apical emphysema bilaterally. No pneumothorax or infiltrate. Precarinal lymph nodes up to 15 mm in diameter. AP window lymph node 11 mm in diameter. Review of the MIP images confirms the above findings CTA HEAD FINDINGS Anterior circulation: Extensive atherosclerotic disease throughout the cavernous carotid bilaterally with moderate stenosis bilaterally. Anterior and middle cerebral arteries patent bilaterally without large vessel occlusion or significant stenosis. Posterior circulation: Mild stenosis distal left vertebral artery moderate to  severe stenosis distal right vertebral artery. Both vertebral arteries patent to the basilar. Basilar is patent. Superior cerebellar and posterior cerebral arteries patent bilaterally. Fetal origin right posterior cerebral artery. Venous sinuses: Normal venous enhancement Anatomic variants: None Review of the MIP images confirms the above findings IMPRESSION: 1. Negative for intracranial large vessel occlusion 2. Advanced atherosclerotic disease. Atherosclerotic aortic arch. 75% diameter stenosis proximal left subclavian artery 3. Right carotid endarterectomy widely patent. Focal calcific stenosis distal right internal carotid artery approximately 80% diameter stenosis. This could be an embolized calcific fragment versus atherosclerotic disease. Moderate stenosis right cavernous carotid artery. 4. Extensive atherosclerotic disease throughout the left carotid artery. Approximately 60% diameter stenosis distal left common carotid artery and 70% diameter stenosis proximal left internal carotid artery. Moderate stenosis left cavernous carotid artery. 5. 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 Mild stenosis distal left vertebral artery. 6. Severe apical emphysema. Electronically Signed   By: Franchot Gallo M.D.   On: 03/03/2021 11:25   DG Chest Port 1 View  Result Date: 03/03/2021 CLINICAL DATA:  Left-sided facial droop and right arm weakness. History of pulmonary fibrosis and emphysema. EXAM: PORTABLE CHEST 1 VIEW COMPARISON:  September 14, 2014 FINDINGS: Lenoria Narine multi lead AICD is present. Multiple sternal wires are seen. Chronic appearing increased interstitial lung markings are noted. There is no evidence of acute infiltrate, pleural effusion or pneumothorax. The cardiac silhouette is moderately enlarged. Marked severity calcification of the aortic arch is seen. No acute osseous abnormalities are seen. IMPRESSION: Cardiomegaly and chronic interstitial lung disease,  without evidence of acute or active cardiopulmonary disease. Electronically Signed   By: Virgina Norfolk M.D.   On: 03/03/2021 15:27   ECHOCARDIOGRAM COMPLETE BUBBLE STUDY  Result Date: 03/04/2021    ECHOCARDIOGRAM REPORT   Patient Name:   RAMLA HASE Date of Exam: 03/03/2021 Medical Rec #:  [id number]    Height:       60.0 in Accession #:    [id number]   Weight:       119.0 lb Date of Birth:  01-19-1946     BSA:          1.497 m Patient Age:    71 years     BP:           71/56 mmHg Patient Gender: F            HR:           107 bpm. Exam Location:  Inpatient Procedure: 2D Echo, Cardiac Doppler, Color Doppler and Saline Contrast Bubble            Study                               MODIFIED REPORT: This report was modified by Cherlynn Kaiser MD on 03/04/2021 due to revision.  Indications:  Stroke  History:         Patient has prior history of Echocardiogram examinations, most                  recent 06/01/2014. CAD, Prior CABG and Pacemaker, Mitral Valve                  Disease; Risk Factors:Former Smoker, Hypertension, Diabetes and                  Dyslipidemia. S/P mitral valve replacement.                   Mitral Valve: 27 mm St. Jude bioprosthetic valve valve is                  present in the mitral position. Procedure Date: 11/2020.  Sonographer:     Clayton Lefort RDCS (AE) Referring Phys:  [id number] Lorenza Chick Diagnosing Phys: Cherlynn Kaiser MD IMPRESSIONS  1. The mitral valve has been repaired/replaced. Trivial mitral valve regurgitation. The mean mitral valve gradient is 7.5 mmHg with average heart rate of 97 bpm. There is Tobey Lippard 27 mm St. Jude bioprosthetic valve present in the mitral position. Procedure Date: 11/2020. E velocity 2.3 m/s, PHT 70 msec, TVI ratio 2.3, EOA 1.33 cm2. There is also Miranda Manning mobile, likely post-op transected chord seen in LV. In the setting of stroke, consider TEE to further evaluate mitral prosthesis. Elevated E velocity and mean gradient may represent valve obstruction however in  setting of tachycardia challenging to discern.  2. Left ventricular ejection fraction, by estimation, is 45%. The left ventricle has mildly decreased function. The left ventricle demonstrates global hypokinesis. There is mild left ventricular hypertrophy. Left ventricular diastolic function could not  be evaluated.  3. Right ventricular systolic function is mildly reduced. The right ventricular size is mildly enlarged. There is mildly elevated pulmonary artery systolic pressure. The estimated right ventricular systolic pressure is 17.0 mmHg.  4. Left atrial size was mild to moderately dilated.  5. The aortic valve is abnormal. There is moderate calcification of the aortic valve. Aortic valve regurgitation is trivial. Mild aortic valve sclerosis is present, with no evidence of aortic valve stenosis.  6. The inferior vena cava is normal in size with <50% respiratory variability, suggesting right atrial pressure of 8 mmHg.  7. Agitated saline contrast bubble study was negative, with no evidence of any interatrial shunt. FINDINGS  Left Ventricle: Left ventricular ejection fraction, by estimation, is 45%. The left ventricle has mildly decreased function. The left ventricle demonstrates global hypokinesis. The left ventricular internal cavity size was normal in size. There is mild left ventricular hypertrophy. Abnormal (paradoxical) septal motion consistent with post-operative status and abnormal (paradoxical) septal motion, consistent with RV pacemaker. Left ventricular diastolic function could not be evaluated due to mitral valve replacement. Left ventricular diastolic function could not be evaluated. Right Ventricle: The right ventricular size is mildly enlarged. No increase in right ventricular wall thickness. Right ventricular systolic function is mildly reduced. There is mildly elevated pulmonary artery systolic pressure. The tricuspid regurgitant  velocity is 2.99 m/s, and with an assumed right atrial pressure of 8  mmHg, the estimated right ventricular systolic pressure is 01.7 mmHg. Left Atrium: Left atrial size was mild to moderately dilated. Right Atrium: Right atrial size was normal in size. Pericardium: There is no evidence of pericardial effusion. Mitral Valve: The mitral valve has been repaired/replaced. Trivial mitral valve regurgitation. There is Miranda Manning 27 mm  [employer] Jude bioprosthetic valve present in the mitral position. Procedure Date: 11/2020. 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 The mean mitral valve gradient is 7.5 mmHg with average heart rate of 97 bpm. Tricuspid Valve: The tricuspid valve is normal in structure. Tricuspid valve regurgitation is mild. Aortic Valve: The aortic valve is abnormal. There is moderate calcification of the aortic valve. Aortic valve regurgitation is trivial. Mild aortic valve sclerosis is present, with no evidence of aortic valve stenosis. Aortic valve mean gradient measures  3.8 mmHg. Aortic valve peak gradient measures 6.1 mmHg. Aortic valve area, by VTI measures 1.59 cm. Pulmonic Valve: The pulmonic valve was normal in structure. Pulmonic valve regurgitation is trivial. Aorta: The aortic root is normal in size and structure. Venous: The inferior vena cava is normal in size with less than 50% respiratory variability, suggesting right atrial pressure of 8 mmHg. IAS/Shunts: No atrial level shunt detected by color flow Doppler. Agitated saline contrast was given intravenously to evaluate for intracardiac shunting. Agitated saline contrast bubble study was negative, with no evidence of any interatrial shunt.  LEFT VENTRICLE PLAX 2D LVIDd:         4.10 cm LVIDs:         3.80 cm LV PW:         1.20 cm LV IVS:        1.30 cm LVOT diam:     2.00 cm LV SV:         33 LV SV Index:   22 LVOT Area:     3.14 cm  LV Volumes (MOD) LV vol d, MOD A2C: 89.5 ml LV vol d, MOD A4C: 107.0 ml LV vol s, MOD A2C: 62.0 ml LV vol s, MOD A4C: 72.5 ml LV SV MOD  A2C:     27.5 ml LV SV MOD A4C:     107.0 ml LV SV MOD BP:      31.0 ml RIGHT VENTRICLE            IVC RV Basal diam:  3.70 cm    IVC diam: 2.00 cm RV Mid diam:    3.10 cm RV S prime:     6.12 cm/s TAPSE (M-mode): 0.6 cm LEFT ATRIUM             Index       RIGHT ATRIUM           Index LA diam:        5.10 cm 3.41 cm/m  RA Area:     13.40 cm LA Vol (A2C):   59.8 ml 39.91 ml/m RA Volume:   34.10 ml  22.77 ml/m LA Vol (A4C):   69.2 ml 46.22 ml/m LA Biplane Vol: 57.5 ml 38.40 ml/m  AORTIC VALVE AV Area (Vmax):    1.75 cm AV Area (Vmean):   1.66 cm AV Area (VTI):     1.59 cm AV Vmax:           123.00 cm/s AV Vmean:          89.175 cm/s AV VTI:            0.210 m AV Peak Grad:      6.1 mmHg AV Mean Grad:      3.8 mmHg LVOT Vmax:         68.38 cm/s LVOT Vmean:        47.100 cm/s LVOT VTI:          0.107 m  LVOT/AV VTI ratio: 0.51  AORTA Ao Root diam: 3.00 cm Ao Asc diam:  3.30 cm MITRAL VALVE           TRICUSPID VALVE MV Mean grad: 7.5 mmHg TR Peak grad:   35.8 mmHg                        TR Vmax:        299.00 cm/s                         SHUNTS                        Systemic VTI:  0.11 m                        Systemic Diam: 2.00 cm Cherlynn Kaiser MD Electronically signed by Cherlynn Kaiser MD Signature Date/Time: 03/03/2021/10:24:28 PM    Final (Updated)    CT HEAD CODE STROKE WO CONTRAST  Result Date: 03/03/2021 CLINICAL DATA:  Code stroke. Acute neuro deficit. Right-sided weakness, facial droop EXAM: CT HEAD WITHOUT CONTRAST TECHNIQUE: Contiguous axial images were obtained from the base of the skull through the vertex without intravenous contrast. COMPARISON:  None. FINDINGS: Brain: No evidence of acute infarction, hemorrhage, hydrocephalus, extra-axial collection or mass lesion/mass effect. Mild white matter hypodensity bilaterally in the frontal lobes, symmetric and appearing chronic. Vascular: Negative for hyperdense vessel. Atherosclerotic calcification in the cavernous carotid bilaterally and in the  distal vertebral artery bilaterally. Skull: Negative Sinuses/Orbits: Mild mucosal edema ethmoid sinuses bilaterally otherwise clear. Negative orbit Other: None ASPECTS ([place] Stroke Program Early CT Score) - Ganglionic level infarction (caudate, lentiform nuclei, internal capsule, insula, M1-M3 cortex): 7 - Supraganglionic infarction (M4-M6 cortex): 3 Total score (0-10 with 10 being normal): 10 IMPRESSION: 1. No acute intracranial abnormality 2. ASPECTS is 10 3. Mild chronic microvascular ischemic change in the white matter. 4. Code stroke imaging results were communicated on 03/03/2021 at 10:50 am to provider Bhagat via text page Electronically Signed   By: Franchot Gallo M.D.   On: 03/03/2021 10:51   VAS US CAROTID  Result Date: 03/04/2021 Carotid Arterial Duplex Study Indications:       TIA, CVA and Carotid artery disease. Risk Factors:      Hypertension, hyperlipidemia, Diabetes, past history of                    smoking, coronary artery disease. Comparison Study:  Prev 11/2019 Post CEA R 1-39 L 40-59 Performing Technologist: Miranda Manning RVT  Examination Guidelines: Miranda Manning complete evaluation includes B-mode imaging, spectral Doppler, color Doppler, and power Doppler as needed of all accessible portions of each vessel. Bilateral testing is considered an integral part of Miranda Manning complete examination. Limited examinations for reoccurring indications may be performed as noted.  Right Carotid Findings: +----------+--------+--------+--------+-------------------------+--------+           PSV cm/sEDV cm/sStenosisPlaque Description       Comments +----------+--------+--------+--------+-------------------------+--------+ CCA Prox  152     23                                                +----------+--------+--------+--------+-------------------------+--------+ CCA Distal95      14      <50%  heterogenous and calcific         +----------+--------+--------+--------+-------------------------+--------+  ICA Prox  67      22      1-39%   heterogenous             Post CEA +----------+--------+--------+--------+-------------------------+--------+ ICA Distal73      25                                                +----------+--------+--------+--------+-------------------------+--------+ ECA       196     22                                                +----------+--------+--------+--------+-------------------------+--------+ +----------+--------+-------+--------+-------------------+           PSV cm/sEDV cmsDescribeArm Pressure (mmHG) +----------+--------+-------+--------+-------------------+ [id number]            Stenotic                    +----------+--------+-------+--------+-------------------+ +---------+--------+--+--------+-+ VertebralPSV cm/s34EDV cm/s7 +---------+--------+--+--------+-+ Angling up under Jaw towards intracranial segment of ICA there is Cintya Daughety large irregular piece of calcific plaque that does not appear to cause Jennessy Sandridge hemodynamic change with velocity. Left Carotid Findings: +----------+--------+--------+--------+-------------------------+--------+           PSV cm/sEDV cm/sStenosisPlaque Description       Comments +----------+--------+--------+--------+-------------------------+--------+ CCA Prox  132     36                                                +----------+--------+--------+--------+-------------------------+--------+ CCA Distal183     32      <50%    calcific and heterogenous         +----------+--------+--------+--------+-------------------------+--------+ ICA Prox  137     34      40-59%  calcific                          +----------+--------+--------+--------+-------------------------+--------+ ICA Distal67      19                                                +----------+--------+--------+--------+-------------------------+--------+ ECA       79      12                                                 +----------+--------+--------+--------+-------------------------+--------+ +----------+--------+--------+-----------------------------+-------------------+           PSV cm/sEDV cm/sDescribe                     Arm Pressure (mmHG) +----------+--------+--------+-----------------------------+-------------------+ [id number]     43      CTA shows proximal off arch  severe stenosis                                  +----------+--------+--------+-----------------------------+-------------------+ +---------+--------+--------+------------------+ VertebralPSV cm/sEDV cm/sBunny ear waveform +---------+--------+--------+------------------+   Summary: Right Carotid: Velocities in the right ICA are consistent with Harpreet Signore 1-39% stenosis.                Non-hemodynamically significant plaque <50% noted in the CCA. Left Carotid: Velocities in the left ICA are consistent with [place] 40-59% stenosis.               Non-hemodynamically significant plaque <50% noted in the CCA. Vertebrals:  Right vertebral artery demonstrates antegrade flow. Bunny Ear              (biphasic) waveform throughout. Subclavians: Right subclavian artery was stenotic. Left subclavian artery flow              was disturbed. *See table(s) above for measurements and observations.  Electronically signed by Antony Contras MD on 03/04/2021 at 12:57:57 PM.    Final         Scheduled Meds: . amiodarone  200 mg Oral q AM  . aspirin EC  81 mg Oral q AM  . atorvastatin  40 mg Oral QHS  . Chlorhexidine Gluconate Cloth  6 each Topical Daily  . empagliflozin  10 mg Oral Daily  . insulin aspart  0-15 Units Subcutaneous TID WC  . magnesium oxide  400 mg Oral BID  . metoprolol tartrate  50 mg Oral BID  . mirtazapine  15 mg Oral QHS  . multivitamin with minerals  1 tablet Oral Q breakfast  . sodium chloride flush  10 mL Intracatheter Q12H  . spironolactone  25 mg Oral Daily  . vitamin B-12  1,000 mcg  Oral Daily   Continuous Infusions: . [START ON 03/05/2021] heparin    . vancomycin HCl 1,000 mg (03/03/21 1851)     LOS: 1 day    Time spent: over 19 min    Fayrene Helper, MD [facility]   To contact the attending provider between 7A-7P or the covering provider during after hours 7P-7A, please log into the web site [email or web address] and access using universal [place] password for that web site. If you do not have the password, please call the hospital operator.  03/04/2021, 5:01 PM

## 2021-03-04 NOTE — Evaluation (Signed)
Physical Therapy Evaluation Patient Details Name: Miranda Manning MRN: [id number] DOB: 10-Nov-1946 Today's Date: 03/04/2021   History of Present Illness  75 y/o female presents to ED with chief complaint of R sided weakness, slurred speech, and R sided facial droop. Symptoms noted to be resolved in ED. CTA shows distal R ICA 80% stenosis and proximal L ICA 70% stenosis. Suggestive of TIA related to severe PVD per CTA. 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 Patient presents with generalized weakness, decreased activity tolerance, and impaired balance. Patient ambulated 58' with min guard and RW with 2/4 DOE, cues required for RW proximity throughout. Patient will benefit from skilled PT services during acute stay to address listed deficits. Recommend HHPT following discharge to maximize functional mobility and safety in the home.     Follow Up Recommendations Home health PT    Equipment Recommendations  None recommended by PT (patient owns necessary DME)    Recommendations for Other Services       Precautions / Restrictions Precautions Precautions: Fall Restrictions Weight Bearing Restrictions: No      Mobility  Bed Mobility Overal bed mobility: Needs Assistance Bed Mobility: Supine to Sit;Sit to Supine     Supine to sit: Supervision Sit to supine: Supervision   General bed mobility comments: supervision for safety, no physical assist required    Transfers Overall transfer level: Needs assistance Equipment used: Rolling [place]ell Fouse (2 wheeled) Transfers: Sit to/from Stand Sit to Stand: Min guard         General transfer comment: min guard for safety, cues for hand placement  Ambulation/Gait Ambulation/Gait assistance: Min guard Gait Distance (Feet): 75 Feet Assistive device: Rolling Markeshia Giebel (2  wheeled) Gait Pattern/deviations: Step-through pattern;Decreased stride length;Trunk flexed;Wide base of support Gait velocity: decreased   General Gait Details: Min guard for safety. Cues required for RW proximity as patient tends to push RW too far out in front which encourages trunk flexion. No overt LOB noted  Stairs            Wheelchair Mobility    Modified Rankin (Stroke Patients Only)       Balance Overall balance assessment: Mild deficits observed, not formally tested                                           Pertinent Vitals/Pain Pain Assessment: No/denies pain    Home Living Family/patient expects to be discharged to:: Private residence Living Arrangements: Children Available Help at Discharge: Family;Available 24 hours/day Type of Home: House Home Access: Stairs to enter   [employer] of Steps: 3 Home Layout: One level Home Equipment: [place] Yoshida - 2 wheels;Dinah Lupa - 4 wheels;Shower seat - built in      Prior Function Level of Independence: Needs assistance   Gait / Transfers Assistance Needed: ambulates independently with RW  ADL's / Homemaking Assistance Needed: daughter assists with bathing to avoid pacemaker site with water. patient able to complete bathing tasks independently        Hand Dominance        Extremity/Trunk Assessment   Upper Extremity Assessment Upper Extremity Assessment: Overall WFL for tasks assessed    Lower Extremity Assessment Lower Extremity Assessment: Generalized weakness (B LE grossly 4/5 with B hip  flexion 3+/5)    Cervical / Trunk Assessment Cervical / Trunk Assessment: Kyphotic  Communication   Communication: No difficulties  Cognition Arousal/Alertness: Awake/alert Behavior During Therapy: WFL for tasks assessed/performed Overall Cognitive Status: History of cognitive impairments - at baseline                                 General Comments: hx of dementia at  baseline. Daughter reports dementia mainly affects short term memory. A&Ox4      General Comments      Exercises     Assessment/Plan    PT Assessment Patient needs continued PT services  PT Problem List Decreased strength;Decreased activity tolerance;Decreased balance;Decreased mobility       PT Treatment Interventions DME instruction;Gait training;Stair training;Functional mobility training;Therapeutic activities;Therapeutic exercise;Balance training;Patient/family education    PT Goals (Current goals can be found in the Care Plan section)  Acute Rehab PT Goals Patient Stated Goal: to go home PT Goal Formulation: With patient/family Time For Goal Achievement: 03/18/21 Potential to Achieve Goals: Good    Frequency Min 3X/week   Barriers to discharge        Co-evaluation               AM-PAC PT "6 Clicks" Mobility  Outcome Measure Help needed turning from your back to your side while in a flat bed without using bedrails?: A Little Help needed moving from lying on your back to sitting on the side of a flat bed without using bedrails?: A Little Help needed moving to and from a bed to a chair (including a wheelchair)?: A Little Help needed standing up from a chair using your arms (e.g., wheelchair or bedside chair)?: A Little Help needed to walk in hospital room?: A Little Help needed climbing 3-5 steps with a railing? : A Little 6 Click Score: 18    End of Session Equipment Utilized During Treatment: Gait belt Activity Tolerance: Patient tolerated treatment well Patient left: in bed;with call bell/phone within reach;with bed alarm set;with family/visitor present Nurse Communication: Mobility status PT Visit Diagnosis: Unsteadiness on feet (R26.81);Muscle weakness (generalized) (M62.81);Difficulty in walking, not elsewhere classified (R26.2)    Time: 3343-5686 PT Time Calculation (min) (ACUTE ONLY): 26 min   Charges:   PT Evaluation $PT Eval Low Complexity: 1  Low          Nyquan Selbe A. Gilford Rile PT, DPT Acute Rehabilitation Services Pager [phone] Office [phone]   Linna Hoff 03/04/2021, 4:31 PM

## 2021-03-04 NOTE — Progress Notes (Signed)
Carotid US completed    Please see CV Proc for preliminary results.   Vasilisa Vore, RVT  

## 2021-03-04 NOTE — Progress Notes (Addendum)
STROKE TEAM PROGRESS NOTE   INTERVAL HISTORY No acute events overnight.  She reports that she is doing well today. She reports no headache or weakness. She has no complaints.  Neuro exam is improved from admission, showing no signs of aphasia and no Right sided weakness. Will obtain Carotid US due to findings of CTA Head and Neck. Per Hospitalist plan is to transfer to [facility] once bed becomes available for further Cardiac care (ablation).  Hospitalists are Primary, Cardiology is following  Vitals:   03/04/21 0356 03/04/21 0737 03/04/21 0922 03/04/21 0933  BP: (!) 122/53 102/63 104/76   Pulse: 69 79  83  Resp: 17 18    Temp: 98 F (36.7 C) 98.2 F (36.8 C)    TempSrc:  Oral    SpO2: 91% 93%    Weight:      Height:       CBC:  Recent Labs  Lab 03/03/21 1040 03/03/21 1049  WBC 7.7  --   NEUTROABS 6.1  --   HGB 10.9* 11.2*  HCT 36.5 33.0*  MCV 86.7  --   PLT 248  --    Basic Metabolic Panel:  Recent Labs  Lab 03/03/21 1040 03/03/21 1049 03/03/21 1830  NA 135 136  --   K 4.3 4.3  --   CL 102 104  --   CO2 25  --   --   GLUCOSE 209* 199*  --   BUN 21 25*  --   CREATININE 0.78 0.70  --   CALCIUM 10.2  --   --   MG  --   --  2.1   Lipid Panel:  Recent Labs  Lab 03/04/21 0307  CHOL 111  TRIG 83  HDL 36*  CHOLHDL 3.1  VLDL 17  LDLCALC 58   HgbA1c:  Recent Labs  Lab 03/04/21 0307  HGBA1C 6.9*   Urine Drug Screen: No results for input(s): LABOPIA, COCAINSCRNUR, LABBENZ, AMPHETMU, THCU, LABBARB in the last 168 hours.  Alcohol Level  Recent Labs  Lab 03/03/21 1040  ETH <10    IMAGING past 24 hours CT ANGIO HEAD W OR WO CONTRAST  Result Date: 03/03/2021 CLINICAL DATA:  Acute neuro deficit. Right-sided weakness and facial droop EXAM: CT ANGIOGRAPHY HEAD AND NECK TECHNIQUE: Multidetector CT imaging of the head and neck was performed using the standard protocol during bolus administration of intravenous contrast. Multiplanar CT image reconstructions and  MIPs were obtained to evaluate the vascular anatomy. Carotid stenosis measurements (when applicable) are obtained utilizing NASCET criteria, using the distal internal carotid diameter as the denominator. CONTRAST:  25mL OMNIPAQUE IOHEXOL 350 MG/ML SOLN COMPARISON:  CT head 03/03/2021 FINDINGS: CTA NECK FINDINGS Aortic arch: Extensive atherosclerotic calcification aortic arch without aneurysm. Atherosclerotic calcification in the proximal great vessels. 75% diameter stenosis proximal left subclavian artery. Right carotid system: Mild atherosclerotic calcification right common carotid artery without stenosis. Postop right carotid endarterectomy which is widely patent. Focal calcification within the right internal carotid artery approximately 2 cm below the skull base. This has an unusual appearance in that there is no surrounding plaque. This appears to be intraluminal in location and is causing significant stenosis. There is minimal opacified lumen adjacent to the calcification. Estimated 80% diameter stenosis. Left carotid system: Extensive atherosclerotic calcification throughout the left common carotid artery with multiple areas of 50% diameter stenosis. Extensive atherosclerotic calcification left carotid bifurcation and proximal left internal carotid artery. Approximately 60% diameter stenosis distal left common carotid artery and 70% diameter stenosis  proximal left internal carotid artery. Vertebral arteries: Left vertebral artery dominant and patent to the basilar. Mild stenosis distal left vertebral artery Occlusion of the proximal right vertebral artery which reconstitutes and is patent to the basilar. Moderate to severe calcific stenosis at the C1 level on the right. Skeleton: Cervical spondylosis.  No acute skeletal abnormality. Other neck: No soft tissue mass.  Prominent jugular vein bilaterally Upper chest: Severe apical emphysema bilaterally. No pneumothorax or infiltrate. Precarinal lymph nodes up to  15 mm in diameter. AP window lymph node 11 mm in diameter. Review of the MIP images confirms the above findings CTA HEAD FINDINGS Anterior circulation: Extensive atherosclerotic disease throughout the cavernous carotid bilaterally with moderate stenosis bilaterally. Anterior and middle cerebral arteries patent bilaterally without large vessel occlusion or significant stenosis. Posterior circulation: Mild stenosis distal left vertebral artery moderate to severe stenosis distal right vertebral artery. Both vertebral arteries patent to the basilar. Basilar is patent. Superior cerebellar and posterior cerebral arteries patent bilaterally. Fetal origin right posterior cerebral artery. Venous sinuses: Normal venous enhancement Anatomic variants: None Review of the MIP images confirms the above findings IMPRESSION: 1. Negative for intracranial large vessel occlusion 2. Advanced atherosclerotic disease. Atherosclerotic aortic arch. 75% diameter stenosis proximal left subclavian artery 3. Right carotid endarterectomy widely patent. Focal calcific stenosis distal right internal carotid artery approximately 80% diameter stenosis. This could be an embolized calcific fragment versus atherosclerotic disease. Moderate stenosis right cavernous carotid artery. 4. Extensive atherosclerotic disease throughout the left carotid artery. Approximately 60% diameter stenosis distal left common carotid artery and 70% diameter stenosis proximal left internal carotid artery. Moderate stenosis left cavernous carotid artery. 5. 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 Mild stenosis distal left vertebral artery. 6. Severe apical emphysema. Electronically Signed   By: Franchot Gallo M.D.   On: 03/03/2021 11:25   CT HEAD WO CONTRAST  Result Date: 03/03/2021 CLINICAL DATA:  Left facial droop EXAM: CT HEAD WITHOUT CONTRAST TECHNIQUE: Contiguous axial images were obtained from the base of the skull  through the vertex without intravenous contrast. COMPARISON:  03/03/2021 FINDINGS: Brain: There is no mass, hemorrhage or extra-axial collection. The size and configuration of the ventricles and extra-axial CSF spaces are normal. There is hypoattenuation of the white matter, most commonly indicating chronic small vessel disease. Vascular: No abnormal hyperdensity of the major intracranial arteries or dural venous sinuses. No intracranial atherosclerosis. Skull: The visualized skull base, calvarium and extracranial soft tissues are normal. Sinuses/Orbits: No fluid levels or advanced mucosal thickening of the visualized paranasal sinuses. No mastoid or middle ear effusion. The orbits are normal. IMPRESSION: Chronic small vessel disease without acute intracranial abnormality. Electronically Signed   By: Ulyses Jarred M.D.   On: 03/03/2021 20:08   CT ANGIO NECK W OR WO CONTRAST  Result Date: 03/03/2021 CLINICAL DATA:  Acute neuro deficit. Right-sided weakness and facial droop EXAM: CT ANGIOGRAPHY HEAD AND NECK TECHNIQUE: Multidetector CT imaging of the head and neck was performed using the standard protocol during bolus administration of intravenous contrast. Multiplanar CT image reconstructions and MIPs were obtained to evaluate the vascular anatomy. Carotid stenosis measurements (when applicable) are obtained utilizing NASCET criteria, using the distal internal carotid diameter as the denominator. CONTRAST:  18mL OMNIPAQUE IOHEXOL 350 MG/ML SOLN COMPARISON:  CT head 03/03/2021 FINDINGS: CTA NECK FINDINGS Aortic arch: Extensive atherosclerotic calcification aortic arch without aneurysm. Atherosclerotic calcification in the proximal great vessels. 75% diameter stenosis proximal left subclavian artery. Right carotid system:  Mild atherosclerotic calcification right common carotid artery without stenosis. Postop right carotid endarterectomy which is widely patent. Focal calcification within the right internal carotid  artery approximately 2 cm below the skull base. This has an unusual appearance in that there is no surrounding plaque. This appears to be intraluminal in location and is causing significant stenosis. There is minimal opacified lumen adjacent to the calcification. Estimated 80% diameter stenosis. Left carotid system: Extensive atherosclerotic calcification throughout the left common carotid artery with multiple areas of 50% diameter stenosis. Extensive atherosclerotic calcification left carotid bifurcation and proximal left internal carotid artery. Approximately 60% diameter stenosis distal left common carotid artery and 70% diameter stenosis proximal left internal carotid artery. Vertebral arteries: Left vertebral artery dominant and patent to the basilar. Mild stenosis distal left vertebral artery Occlusion of the proximal right vertebral artery which reconstitutes and is patent to the basilar. Moderate to severe calcific stenosis at the C1 level on the right. Skeleton: Cervical spondylosis.  No acute skeletal abnormality. Other neck: No soft tissue mass.  Prominent jugular vein bilaterally Upper chest: Severe apical emphysema bilaterally. No pneumothorax or infiltrate. Precarinal lymph nodes up to 15 mm in diameter. AP window lymph node 11 mm in diameter. Review of the MIP images confirms the above findings CTA HEAD FINDINGS Anterior circulation: Extensive atherosclerotic disease throughout the cavernous carotid bilaterally with moderate stenosis bilaterally. Anterior and middle cerebral arteries patent bilaterally without large vessel occlusion or significant stenosis. Posterior circulation: Mild stenosis distal left vertebral artery moderate to severe stenosis distal right vertebral artery. Both vertebral arteries patent to the basilar. Basilar is patent. Superior cerebellar and posterior cerebral arteries patent bilaterally. Fetal origin right posterior cerebral artery. Venous sinuses: Normal venous enhancement  Anatomic variants: None Review of the MIP images confirms the above findings IMPRESSION: 1. Negative for intracranial large vessel occlusion 2. Advanced atherosclerotic disease. Atherosclerotic aortic arch. 75% diameter stenosis proximal left subclavian artery 3. Right carotid endarterectomy widely patent. Focal calcific stenosis distal right internal carotid artery approximately 80% diameter stenosis. This could be an embolized calcific fragment versus atherosclerotic disease. Moderate stenosis right cavernous carotid artery. 4. Extensive atherosclerotic disease throughout the left carotid artery. Approximately 60% diameter stenosis distal left common carotid artery and 70% diameter stenosis proximal left internal carotid artery. Moderate stenosis left cavernous carotid artery. 5. 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 Mild stenosis distal left vertebral artery. 6. Severe apical emphysema. Electronically Signed   By: Franchot Gallo M.D.   On: 03/03/2021 11:25   DG Chest Port 1 View  Result Date: 03/03/2021 CLINICAL DATA:  Left-sided facial droop and right arm weakness. History of pulmonary fibrosis and emphysema. EXAM: PORTABLE CHEST 1 VIEW COMPARISON:  September 14, 2014 FINDINGS: A multi lead AICD is present. Multiple sternal wires are seen. Chronic appearing increased interstitial lung markings are noted. There is no evidence of acute infiltrate, pleural effusion or pneumothorax. The cardiac silhouette is moderately enlarged. Marked severity calcification of the aortic arch is seen. No acute osseous abnormalities are seen. IMPRESSION: Cardiomegaly and chronic interstitial lung disease, without evidence of acute or active cardiopulmonary disease. Electronically Signed   By: Virgina Norfolk M.D.   On: 03/03/2021 15:27   ECHOCARDIOGRAM COMPLETE BUBBLE STUDY  Result Date: 03/04/2021    ECHOCARDIOGRAM REPORT   Patient Name:   Miranda Manning Date of Exam:  03/03/2021 Medical Rec #:  [id number]    Height:       60.0 in Accession #:  [id number]   Weight:       119.0 lb Date of Birth:  13-May-1946     BSA:          1.497 m Patient Age:    35 years     BP:           71/56 mmHg Patient Gender: F            HR:           107 bpm. Exam Location:  Inpatient Procedure: 2D Echo, Cardiac Doppler, Color Doppler and Saline Contrast Bubble            Study                               MODIFIED REPORT: This report was modified by Cherlynn Kaiser MD on 03/04/2021 due to revision.  Indications:     Stroke  History:         Patient has prior history of Echocardiogram examinations, most                  recent 06/01/2014. CAD, Prior CABG and Pacemaker, Mitral Valve                  Disease; Risk Factors:Former Smoker, Hypertension, Diabetes and                  Dyslipidemia. S/P mitral valve replacement.                   Mitral Valve: 27 mm St. Jude bioprosthetic valve valve is                  present in the mitral position. Procedure Date: 11/2020.  Sonographer:     Clayton Lefort RDCS (AE) Referring Phys:  [id number] Lorenza Chick Diagnosing Phys: Cherlynn Kaiser MD IMPRESSIONS  1. The mitral valve has been repaired/replaced. Trivial mitral valve regurgitation. The mean mitral valve gradient is 7.5 mmHg with average heart rate of 97 bpm. There is a 27 mm St. Jude bioprosthetic valve present in the mitral position. Procedure Date: 11/2020. 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 E velocity 2.3 m/s, PHT 250 msec, TVI ratio 2.3, EOA 1.33 cm2. There is also a     mobile, likely post-op transected chord seen in LV. In the setting of stroke, consider TEE to further evaluate mitral prosthesis. Calculations may be impacted by rhythm, HR and low stroke volume.  2. Left ventricular ejection fraction, by estimation, is 45%. The left ventricle has mildly decreased function. The left ventricle demonstrates global hypokinesis. There is mild left  ventricular hypertrophy. Left ventricular diastolic function could not  be evaluated.  3. Right ventricular systolic function is mildly reduced. The right ventricular size is mildly enlarged. There is mildly elevated pulmonary artery systolic pressure. The estimated right ventricular systolic pressure is 25.0 mmHg.  4. Left atrial size was mild to moderately dilated.  5. The aortic valve is abnormal. There is moderate calcification of the aortic valve. Aortic valve regurgitation is trivial. Mild aortic valve sclerosis is present, with no evidence of aortic valve stenosis.  6. The inferior vena cava is normal in size with <50% respiratory variability, suggesting right atrial pressure of 8 mmHg.  7. Agitated saline contrast bubble study was negative, with no evidence of any interatrial shunt. FINDINGS  Left Ventricle: Left ventricular ejection fraction, by estimation, is  45%. The left ventricle has mildly decreased function. The left ventricle demonstrates global hypokinesis. The left ventricular internal cavity size was normal in size. There is mild left ventricular hypertrophy. Abnormal (paradoxical) septal motion consistent with post-operative status and abnormal (paradoxical) septal motion, consistent with RV pacemaker. Left ventricular diastolic function could not be evaluated due to mitral valve replacement. Left ventricular diastolic function could not be evaluated. Right Ventricle: The right ventricular size is mildly enlarged. No increase in right ventricular wall thickness. Right ventricular systolic function is mildly reduced. There is mildly elevated pulmonary artery systolic pressure. The tricuspid regurgitant  velocity is 2.99 m/s, and with an assumed right atrial pressure of 8 mmHg, the estimated right ventricular systolic pressure is 93.7 mmHg. Left Atrium: Left atrial size was mild to moderately dilated. Right Atrium: Right atrial size was normal in size. Pericardium: There is no evidence of  pericardial effusion. Mitral Valve: The mitral valve has been repaired/replaced. Trivial mitral valve regurgitation. There is a 27 mm St. Jude bioprosthetic valve present in the mitral position. Procedure Date: 11/2020. 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 The mean mitral valve gradient is 7.5 mmHg with average heart rate of 97 bpm. Tricuspid Valve: The tricuspid valve is normal in structure. Tricuspid valve regurgitation is mild. Aortic Valve: The aortic valve is abnormal. There is moderate calcification of the aortic valve. Aortic valve regurgitation is trivial. Mild aortic valve sclerosis is present, with no evidence of aortic valve stenosis. Aortic valve mean gradient measures  3.8 mmHg. Aortic valve peak gradient measures 6.1 mmHg. Aortic valve area, by VTI measures 1.59 cm. Pulmonic Valve: The pulmonic valve was normal in structure. Pulmonic valve regurgitation is trivial. Aorta: The aortic root is normal in size and structure. Venous: The inferior vena cava is normal in size with less than 50% respiratory variability, suggesting right atrial pressure of 8 mmHg. IAS/Shunts: No atrial level shunt detected by color flow Doppler. Agitated saline contrast was given intravenously to evaluate for intracardiac shunting. Agitated saline contrast bubble study was negative, with no evidence of any interatrial shunt.  LEFT VENTRICLE PLAX 2D LVIDd:         4.10 cm LVIDs:         3.80 cm LV PW:         1.20 cm LV IVS:        1.30 cm LVOT diam:     2.00 cm LV SV:         33 LV SV Index:   22 LVOT Area:     3.14 cm  LV Volumes (MOD) LV vol d, MOD A2C: 89.5 ml LV vol d, MOD A4C: 107.0 ml LV vol s, MOD A2C: 62.0 ml LV vol s, MOD A4C: 72.5 ml LV SV MOD A2C:     27.5 ml LV SV MOD A4C:     107.0 ml LV SV MOD BP:      31.0 ml RIGHT VENTRICLE            IVC RV Basal diam:  3.70 cm    IVC diam: 2.00 cm RV Mid diam:    3.10 cm RV S prime:     6.12 cm/s TAPSE (M-mode): 0.6 cm  LEFT ATRIUM             Index       RIGHT ATRIUM           Index LA diam:        5.10  cm 3.41 cm/m  RA Area:     13.40 cm LA Vol (A2C):   59.8 ml 39.91 ml/m RA Volume:   34.10 ml  22.77 ml/m LA Vol (A4C):   69.2 ml 46.22 ml/m LA Biplane Vol: 57.5 ml 38.40 ml/m  AORTIC VALVE AV Area (Vmax):    1.75 cm AV Area (Vmean):   1.66 cm AV Area (VTI):     1.59 cm AV Vmax:           123.00 cm/s AV Vmean:          89.175 cm/s AV VTI:            0.210 m AV Peak Grad:      6.1 mmHg AV Mean Grad:      3.8 mmHg LVOT Vmax:         68.38 cm/s LVOT Vmean:        47.100 cm/s LVOT VTI:          0.107 m LVOT/AV VTI ratio: 0.51  AORTA Ao Root diam: 3.00 cm Ao Asc diam:  3.30 cm MITRAL VALVE           TRICUSPID VALVE MV Mean grad: 7.5 mmHg TR Peak grad:   35.8 mmHg                        TR Vmax:        299.00 cm/s                         SHUNTS                        Systemic VTI:  0.11 m                        Systemic Diam: 2.00 cm Cherlynn Kaiser MD Electronically signed by Cherlynn Kaiser MD Signature Date/Time: 03/03/2021/10:24:28 PM    Final (Updated)    CT HEAD CODE STROKE WO CONTRAST  Result Date: 03/03/2021 CLINICAL DATA:  Code stroke. Acute neuro deficit. Right-sided weakness, facial droop EXAM: CT HEAD WITHOUT CONTRAST TECHNIQUE: Contiguous axial images were obtained from the base of the skull through the vertex without intravenous contrast. COMPARISON:  None. FINDINGS: Brain: No evidence of acute infarction, hemorrhage, hydrocephalus, extra-axial collection or mass lesion/mass effect. Mild white matter hypodensity bilaterally in the frontal lobes, symmetric and appearing chronic. Vascular: Negative for hyperdense vessel. Atherosclerotic calcification in the cavernous carotid bilaterally and in the distal vertebral artery bilaterally. Skull: Negative Sinuses/Orbits: Mild mucosal edema ethmoid sinuses bilaterally otherwise clear. Negative orbit Other: None ASPECTS ([place] Stroke Program Early CT Score) - Ganglionic  level infarction (caudate, lentiform nuclei, internal capsule, insula, M1-M3 cortex): 7 - Supraganglionic infarction (M4-M6 cortex): 3 Total score (0-10 with 10 being normal): 10 IMPRESSION: 1. No acute intracranial abnormality 2. ASPECTS is 10 3. Mild chronic microvascular ischemic change in the white matter. 4. Code stroke imaging results were communicated on 03/03/2021 at 10:50 am to provider Bhagat via text page Electronically Signed   By: Franchot Gallo M.D.   On: 03/03/2021 10:51    PHYSICAL EXAM Blood pressure 104/76, pulse 83, temperature 98.2 F (36.8 C), temperature source Oral, resp. rate 18, height 5' (1.524 m), weight 54 kg, SpO2 93 %.  General: alert and awake, elderly caucasian female, no apparent distress  Lungs: Symmetrical Chest rise, no labored breathing  Cardio: Irregular Rate  with Regular Rhythm  Abdomen: Soft, non-tender  Neuro: Alert, oriented, thought content appropriate.  Speech fluent without evidence of aphasia.  Able to follow all commands without difficulty. Cranial Nerves: II:  Visual fields grossly normal, pupils equal, round, reactive to light and accommodation III,IV, VI: ptosis not present, extra-ocular motions intact bilaterally V,VII: smile symmetric, facial light touch sensation normal bilaterally VIII: hearing normal bilaterally IX,X: uvula rises symmetrically XI: bilateral shoulder shrug XII: midline tongue extension without atrophy or fasciculations  Motor: Right : Upper extremity   4+/5    Left:     Upper extremity   4+/5  Lower extremity   4+/5    Lower extremity   4+/5 Tone and bulk:normal tone throughout; no atrophy noted Sensory: light touch intact throughout, bilaterally Cerebellar: normal finger-to-nose Gait: deferred    ASSESSMENT/PLAN Ms. TONDALAYA PERREN is a 75 y.o. female with history of hypertension, hyperlipidemia, type 2 diabetes, former smoking (25 pack years, quit 1989), coronary artery disease status post CABG x3 (December  2021), severe mitral valve regurgitation s/p mitral valve replacement (December 2021), left subclavian artery stenosis presenting with Aphasia and Right sided weakness. Patient had been in her normal recent baseline and eating some breakfast. Daughter reported that patient began to have some shaking and bobbing of her head as if she was falling asleep.  She was assisted to the couch and noted to be weaker on the right side with the right facial droop and some difficulty speaking (slurred speech as well as apparent aphasia).  Her blood sugar had been checked recently before this episode and was 161 on her home glucometer.  The patient's symptoms improved briefly when she was laid down on the couch but then returned.  EMS was activated and daughter expressed a strong preference for the patient to be transported to [facility] over [facility] (daughter is STEMI response coordinator nurse at [employer]).  And route to [facility] the patient's symptoms were rapidly improving.   She reports feeling fine today. She is back to baseline. Will continue heparin for now but get TEE to look at the above listed valve floor dysfunction versus clot.  Plan is to transfer her to [facility] for Ablation procedure but this may have to be postponed given recent TIA.  Discussed with cardiology: And they will reach out to the [facility] cardiology to make a definite plan.   TIA:  Code Stroke CT head No acute abnormality. Small vessel disease. Atrophy. ASPECTS 10.  CTA head & neck - Negative for intracranial large vessel occlusion Advanced atherosclerotic disease. Atherosclerotic aortic arch. 75% diameter stenosis proximal left subclavian artery. Right carotid endarterectomy widely patent. Focal calcific stenosis distal right internal carotid artery approximately 80% diameter stenosis. This could be an embolized calcific fragment versus atherosclerotic disease. Moderate stenosis right cavernous carotid artery. Extensive atherosclerotic disease  throughout the left carotid artery. Approximately 60% diameter stenosis distal left common carotid artery and 70% diameter stenosis proximal left internal carotid artery. Moderate stenosis left cavernous carotid artery. 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 Mild stenosis distal left vertebral artery. Severe apical emphysema.  Follow Up CT - Chronic small vessel disease without acute intracranial abnormality.  Carotid Doppler - Right Carotid: Velocities in the right ICA are consistent with a 1-39% stenosis. Non-hemodynamically significant plaque <50% noted in the CCA.   Left Carotid: Velocities in the left ICA are consistent with a 40-59% stenosis. Non-hemodynamically significant plaque <50% noted in the CCA. Vertebrals:  Right vertebral artery demonstrates antegrade flow. [place] (biphasic) waveform throughout. Subclavians: Right subclavian artery was stenotic.   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   2D Echo with Bubble - EF: 45%. Global Hypokinesis, mild   LDL 58  HgbA1c 6.9  VTE prophylaxis - SCD's    Diet   Diet heart healthy/carb modified Room service appropriate? Yes; Fluid consistency: Thin     aspirin 81 mg daily and Eliquis (apixaban) daily prior to admission, now on aspirin 81 mg daily and Eliquis (apixaban) daily.  Therapy recommendations:  Pending  Disposition:  Transfer to [facility]  Hypertension  Home meds:  Amiodarone, Enalapril, Metoprolol succinate, Spironolactone, Torsemide   Stable . Long-term BP goal normotensive  Hyperlipidemia  Home meds: Simvastatin, Ezetimbe, not resumed in hospital  LDL 58, goal < 70  Add Atorvastatin  Continue statin at discharge  Diabetes type II Controlled  Home meds:  Empagliflozin   HgbA1c 6.9, goal < 7.0  CBGs Recent Labs    03/03/21 1641 03/03/21 2116 03/04/21 0630  GLUCAP 122* 89 111*      SSI  Other Stroke Risk Factors  Advanced Age >/= 57    Former Cigarette smoker  Coronary artery disease  CHF   Other Active Problems  Alzheimer's Dementia  LBBB, PVD, A fib - being followed by Cardiology  Hospital day # 1  Fatima Sanger MD Resident  I have personally obtained history,examined this patient, reviewed notes, independently viewed imaging studies, participated in medical decision making and plan of care.ROS completed by me personally and pertinent positives fully documented  I have made any additions or clarifications directly to the above note. Agree with note above.  Patient presented with transient aphasia and right-sided weakness possibly left hemispheric TIA.  He has A. 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  Recommend continue IV heparin for now and hold off on starting Eliquis till TEE is obtained and will is started more closely.  Discussed with Dr. Margaretann Loveless cardiologist who will reach out to [facility] cardiology to decide if ablation is going to be postponed due to patient's TIA in that case counseling 20 would not be necessary.  Discussed with Dr. Florene Glen.  Greater than 50% time during the 35-minute visit was spent in TIA fibrillation discussion about  Antony Contras, [place] Pager: [phone] 03/04/2021 2:45 PM  To contact Stroke Continuity provider, please refer to [email or web address] After hours, contact General Neurology

## 2021-03-04 NOTE — Progress Notes (Signed)
Progress Note  Patient Name: Miranda Manning Date of Encounter: 03/04/2021  Primary Cardiologist: No primary care provider on file.   Subjective   Feels back to baseline, wants to go home.  Inpatient Medications    Scheduled Meds: . amiodarone  200 mg Oral q AM  . aspirin EC  81 mg Oral q AM  . atorvastatin  40 mg Oral QHS  . Chlorhexidine Gluconate Cloth  6 each Topical Daily  . empagliflozin  10 mg Oral Daily  . insulin aspart  0-15 Units Subcutaneous TID WC  . magnesium oxide  400 mg Oral BID  . metoprolol tartrate  50 mg Oral BID  . mirtazapine  15 mg Oral QHS  . multivitamin with minerals  1 tablet Oral Q breakfast  . spironolactone  25 mg Oral Daily  . vitamin B-12  1,000 mcg Oral Daily   Continuous Infusions: . vancomycin HCl 1,000 mg (03/03/21 1851)   PRN Meds: acetaminophen **OR** acetaminophen (TYLENOL) oral liquid 160 mg/5 mL **OR** acetaminophen, metoprolol tartrate, senna-docusate   Vital Signs    Vitals:   03/04/21 0356 03/04/21 0737 03/04/21 0922 03/04/21 0933  BP: (!) 122/53 102/63 104/76   Pulse: 69 79  83  Resp: 17 18    Temp: 98 F (36.7 C) 98.2 F (36.8 C)    TempSrc:  Oral    SpO2: 91% 93%    Weight:      Height:        Intake/Output Summary (Last 24 hours) at 03/04/2021 1128 Last data filed at 03/04/2021 0900 Gross per 24 hour  Intake 459 ml  Output --  Net 459 ml   Filed Weights   03/03/21 1000 03/03/21 1043  Weight: 54 kg 54 kg    Telemetry    Ventricular paced rhythm, AF- Personally Reviewed  ECG    V paced, AF- Personally Reviewed  Physical Exam   GEN: No acute distress.   Neck: No JVD Cardiac: regular rhythm, normal rate, no murmurs, rubs, or gallops.  Respiratory: Clear to auscultation bilaterally. GI: Soft, nontender, non-distended  MS: No edema; No deformity. Neuro:   Moves all extremities, no gross speech or motor deficits. Psych: Normal affect   Labs    Chemistry Recent Labs  Lab 03/03/21 1040  03/03/21 1049  NA 135 136  K 4.3 4.3  CL 102 104  CO2 25  --   GLUCOSE 209* 199*  BUN 21 25*  CREATININE 0.78 0.70  CALCIUM 10.2  --   PROT 6.2*  --   ALBUMIN 3.4*  --   AST 32  --   ALT 27  --   ALKPHOS 79  --   BILITOT 0.6  --   GFRNONAA >60  --   ANIONGAP 8  --      Hematology Recent Labs  Lab 03/03/21 1040 03/03/21 1049  WBC 7.7  --   RBC 4.21  --   HGB 10.9* 11.2*  HCT 36.5 33.0*  MCV 86.7  --   MCH 25.9*  --   MCHC 29.9*  --   RDW 20.0*  --   PLT 248  --     Cardiac EnzymesNo results for input(s): TROPONINI in the last 168 hours. No results for input(s): TROPIPOC in the last 168 hours.   BNPNo results for input(s): BNP, PROBNP in the last 168 hours.   DDimer No results for input(s): DDIMER in the last 168 hours.   Radiology    CT ANGIO  HEAD W OR WO CONTRAST  Result Date: 03/03/2021 CLINICAL DATA:  Acute neuro deficit. Right-sided weakness and facial droop EXAM: CT ANGIOGRAPHY HEAD AND NECK TECHNIQUE: Multidetector CT imaging of the head and neck was performed using the standard protocol during bolus administration of intravenous contrast. Multiplanar CT image reconstructions and MIPs were obtained to evaluate the vascular anatomy. Carotid stenosis measurements (when applicable) are obtained utilizing NASCET criteria, using the distal internal carotid diameter as the denominator. CONTRAST:  10mL OMNIPAQUE IOHEXOL 350 MG/ML SOLN COMPARISON:  CT head 03/03/2021 FINDINGS: CTA NECK FINDINGS Aortic arch: Extensive atherosclerotic calcification aortic arch without aneurysm. Atherosclerotic calcification in the proximal great vessels. 75% diameter stenosis proximal left subclavian artery. Right carotid system: Mild atherosclerotic calcification right common carotid artery without stenosis. Postop right carotid endarterectomy which is widely patent. Focal calcification within the right internal carotid artery approximately 2 cm below the skull base. This has an unusual  appearance in that there is no surrounding plaque. This appears to be intraluminal in location and is causing significant stenosis. There is minimal opacified lumen adjacent to the calcification. Estimated 80% diameter stenosis. Left carotid system: Extensive atherosclerotic calcification throughout the left common carotid artery with multiple areas of 50% diameter stenosis. Extensive atherosclerotic calcification left carotid bifurcation and proximal left internal carotid artery. Approximately 60% diameter stenosis distal left common carotid artery and 70% diameter stenosis proximal left internal carotid artery. Vertebral arteries: Left vertebral artery dominant and patent to the basilar. Mild stenosis distal left vertebral artery Occlusion of the proximal right vertebral artery which reconstitutes and is patent to the basilar. Moderate to severe calcific stenosis at the C1 level on the right. Skeleton: Cervical spondylosis.  No acute skeletal abnormality. Other neck: No soft tissue mass.  Prominent jugular vein bilaterally Upper chest: Severe apical emphysema bilaterally. No pneumothorax or infiltrate. Precarinal lymph nodes up to 15 mm in diameter. AP window lymph node 11 mm in diameter. Review of the MIP images confirms the above findings CTA HEAD FINDINGS Anterior circulation: Extensive atherosclerotic disease throughout the cavernous carotid bilaterally with moderate stenosis bilaterally. Anterior and middle cerebral arteries patent bilaterally without large vessel occlusion or significant stenosis. Posterior circulation: Mild stenosis distal left vertebral artery moderate to severe stenosis distal right vertebral artery. Both vertebral arteries patent to the basilar. Basilar is patent. Superior cerebellar and posterior cerebral arteries patent bilaterally. Fetal origin right posterior cerebral artery. Venous sinuses: Normal venous enhancement Anatomic variants: None Review of the MIP images confirms the above  findings IMPRESSION: 1. Negative for intracranial large vessel occlusion 2. Advanced atherosclerotic disease. Atherosclerotic aortic arch. 75% diameter stenosis proximal left subclavian artery 3. Right carotid endarterectomy widely patent. Focal calcific stenosis distal right internal carotid artery approximately 80% diameter stenosis. This could be an embolized calcific fragment versus atherosclerotic disease. Moderate stenosis right cavernous carotid artery. 4. Extensive atherosclerotic disease throughout the left carotid artery. Approximately 60% diameter stenosis distal left common carotid artery and 70% diameter stenosis proximal left internal carotid artery. Moderate stenosis left cavernous carotid artery. 5. 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 Mild stenosis distal left vertebral artery. 6. Severe apical emphysema. Electronically Signed   By: Franchot Gallo M.D.   On: 03/03/2021 11:25   CT HEAD WO CONTRAST  Result Date: 03/03/2021 CLINICAL DATA:  Left facial droop EXAM: CT HEAD WITHOUT CONTRAST TECHNIQUE: Contiguous axial images were obtained from the base of the skull through the vertex without intravenous contrast. COMPARISON:  03/03/2021 FINDINGS: Brain:  There is no mass, hemorrhage or extra-axial collection. The size and configuration of the ventricles and extra-axial CSF spaces are normal. There is hypoattenuation of the white matter, most commonly indicating chronic small vessel disease. Vascular: No abnormal hyperdensity of the major intracranial arteries or dural venous sinuses. No intracranial atherosclerosis. Skull: The visualized skull base, calvarium and extracranial soft tissues are normal. Sinuses/Orbits: No fluid levels or advanced mucosal thickening of the visualized paranasal sinuses. No mastoid or middle ear effusion. The orbits are normal. IMPRESSION: Chronic small vessel disease without acute intracranial abnormality. Electronically  Signed   By: Ulyses Jarred M.D.   On: 03/03/2021 20:08   CT ANGIO NECK W OR WO CONTRAST  Result Date: 03/03/2021 CLINICAL DATA:  Acute neuro deficit. Right-sided weakness and facial droop EXAM: CT ANGIOGRAPHY HEAD AND NECK TECHNIQUE: Multidetector CT imaging of the head and neck was performed using the standard protocol during bolus administration of intravenous contrast. Multiplanar CT image reconstructions and MIPs were obtained to evaluate the vascular anatomy. Carotid stenosis measurements (when applicable) are obtained utilizing NASCET criteria, using the distal internal carotid diameter as the denominator. CONTRAST:  63mL OMNIPAQUE IOHEXOL 350 MG/ML SOLN COMPARISON:  CT head 03/03/2021 FINDINGS: CTA NECK FINDINGS Aortic arch: Extensive atherosclerotic calcification aortic arch without aneurysm. Atherosclerotic calcification in the proximal great vessels. 75% diameter stenosis proximal left subclavian artery. Right carotid system: Mild atherosclerotic calcification right common carotid artery without stenosis. Postop right carotid endarterectomy which is widely patent. Focal calcification within the right internal carotid artery approximately 2 cm below the skull base. This has an unusual appearance in that there is no surrounding plaque. This appears to be intraluminal in location and is causing significant stenosis. There is minimal opacified lumen adjacent to the calcification. Estimated 80% diameter stenosis. Left carotid system: Extensive atherosclerotic calcification throughout the left common carotid artery with multiple areas of 50% diameter stenosis. Extensive atherosclerotic calcification left carotid bifurcation and proximal left internal carotid artery. Approximately 60% diameter stenosis distal left common carotid artery and 70% diameter stenosis proximal left internal carotid artery. Vertebral arteries: Left vertebral artery dominant and patent to the basilar. Mild stenosis distal left  vertebral artery Occlusion of the proximal right vertebral artery which reconstitutes and is patent to the basilar. Moderate to severe calcific stenosis at the C1 level on the right. Skeleton: Cervical spondylosis.  No acute skeletal abnormality. Other neck: No soft tissue mass.  Prominent jugular vein bilaterally Upper chest: Severe apical emphysema bilaterally. No pneumothorax or infiltrate. Precarinal lymph nodes up to 15 mm in diameter. AP window lymph node 11 mm in diameter. Review of the MIP images confirms the above findings CTA HEAD FINDINGS Anterior circulation: Extensive atherosclerotic disease throughout the cavernous carotid bilaterally with moderate stenosis bilaterally. Anterior and middle cerebral arteries patent bilaterally without large vessel occlusion or significant stenosis. Posterior circulation: Mild stenosis distal left vertebral artery moderate to severe stenosis distal right vertebral artery. Both vertebral arteries patent to the basilar. Basilar is patent. Superior cerebellar and posterior cerebral arteries patent bilaterally. Fetal origin right posterior cerebral artery. Venous sinuses: Normal venous enhancement Anatomic variants: None Review of the MIP images confirms the above findings IMPRESSION: 1. Negative for intracranial large vessel occlusion 2. Advanced atherosclerotic disease. Atherosclerotic aortic arch. 75% diameter stenosis proximal left subclavian artery 3. Right carotid endarterectomy widely patent. Focal calcific stenosis distal right internal carotid artery approximately 80% diameter stenosis. This could be an embolized calcific fragment versus atherosclerotic disease. Moderate stenosis right cavernous carotid artery. 4. Extensive  atherosclerotic disease throughout the left carotid artery. Approximately 60% diameter stenosis distal left common carotid artery and 70% diameter stenosis proximal left internal carotid artery. Moderate stenosis left cavernous carotid artery. 5.  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 Mild stenosis distal left vertebral artery. 6. Severe apical emphysema. Electronically Signed   By: Franchot Gallo M.D.   On: 03/03/2021 11:25   DG Chest Port 1 View  Result Date: 03/03/2021 CLINICAL DATA:  Left-sided facial droop and right arm weakness. History of pulmonary fibrosis and emphysema. EXAM: PORTABLE CHEST 1 VIEW COMPARISON:  September 14, 2014 FINDINGS: A multi lead AICD is present. Multiple sternal wires are seen. Chronic appearing increased interstitial lung markings are noted. There is no evidence of acute infiltrate, pleural effusion or pneumothorax. The cardiac silhouette is moderately enlarged. Marked severity calcification of the aortic arch is seen. No acute osseous abnormalities are seen. IMPRESSION: Cardiomegaly and chronic interstitial lung disease, without evidence of acute or active cardiopulmonary disease. Electronically Signed   By: Virgina Norfolk M.D.   On: 03/03/2021 15:27   ECHOCARDIOGRAM COMPLETE BUBBLE STUDY  Result Date: 03/04/2021    ECHOCARDIOGRAM REPORT   Patient Name:   CESIAH WESTLEY Date of Exam: 03/03/2021 Medical Rec #:  [id number]    Height:       60.0 in Accession #:    [id number]   Weight:       119.0 lb Date of Birth:  1946-05-16     BSA:          1.497 m Patient Age:    60 years     BP:           71/56 mmHg Patient Gender: F            HR:           107 bpm. Exam Location:  Inpatient Procedure: 2D Echo, Cardiac Doppler, Color Doppler and Saline Contrast Bubble            Study                               MODIFIED REPORT: This report was modified by Cherlynn Kaiser MD on 03/04/2021 due to revision.  Indications:     Stroke  History:         Patient has prior history of Echocardiogram examinations, most                  recent 06/01/2014. CAD, Prior CABG and Pacemaker, Mitral Valve                  Disease; Risk Factors:Former Smoker, Hypertension, Diabetes and                   Dyslipidemia. S/P mitral valve replacement.                   Mitral Valve: 27 mm St. Jude bioprosthetic valve valve is                  present in the mitral position. Procedure Date: 11/2020.  Sonographer:     Clayton Lefort RDCS (AE) Referring Phys:  [id number] Lorenza Chick Diagnosing Phys: Cherlynn Kaiser MD IMPRESSIONS  1. The mitral valve has been repaired/replaced. Trivial mitral valve regurgitation. The mean mitral valve gradient is 7.5 mmHg with average heart rate of 97 bpm. There is a 27 mm  St. Jude bioprosthetic valve present in the mitral position. Procedure Date: 11/2020. 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 E velocity 2.3 m/s, PHT 250 msec, TVI ratio 2.3, EOA 1.33 cm2. There is also a     mobile, likely post-op transected chord seen in LV. In the setting of stroke, consider TEE to further evaluate mitral prosthesis. Calculations may be impacted by rhythm, HR and low stroke volume.  2. Left ventricular ejection fraction, by estimation, is 45%. The left ventricle has mildly decreased function. The left ventricle demonstrates global hypokinesis. There is mild left ventricular hypertrophy. Left ventricular diastolic function could not  be evaluated.  3. Right ventricular systolic function is mildly reduced. The right ventricular size is mildly enlarged. There is mildly elevated pulmonary artery systolic pressure. The estimated right ventricular systolic pressure is 30.8 mmHg.  4. Left atrial size was mild to moderately dilated.  5. The aortic valve is abnormal. There is moderate calcification of the aortic valve. Aortic valve regurgitation is trivial. Mild aortic valve sclerosis is present, with no evidence of aortic valve stenosis.  6. The inferior vena cava is normal in size with <50% respiratory variability, suggesting right atrial pressure of 8 mmHg.  7. Agitated saline contrast bubble study was negative, with no evidence of any interatrial shunt.  FINDINGS  Left Ventricle: Left ventricular ejection fraction, by estimation, is 45%. The left ventricle has mildly decreased function. The left ventricle demonstrates global hypokinesis. The left ventricular internal cavity size was normal in size. There is mild left ventricular hypertrophy. Abnormal (paradoxical) septal motion consistent with post-operative status and abnormal (paradoxical) septal motion, consistent with RV pacemaker. Left ventricular diastolic function could not be evaluated due to mitral valve replacement. Left ventricular diastolic function could not be evaluated. Right Ventricle: The right ventricular size is mildly enlarged. No increase in right ventricular wall thickness. Right ventricular systolic function is mildly reduced. There is mildly elevated pulmonary artery systolic pressure. The tricuspid regurgitant  velocity is 2.99 m/s, and with an assumed right atrial pressure of 8 mmHg, the estimated right ventricular systolic pressure is 65.7 mmHg. Left Atrium: Left atrial size was mild to moderately dilated. Right Atrium: Right atrial size was normal in size. Pericardium: There is no evidence of pericardial effusion. Mitral Valve: The mitral valve has been repaired/replaced. Trivial mitral valve regurgitation. There is a 27 mm St. Jude bioprosthetic valve present in the mitral position. Procedure Date: 11/2020. 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 The mean mitral valve gradient is 7.5 mmHg with average heart rate of 97 bpm. Tricuspid Valve: The tricuspid valve is normal in structure. Tricuspid valve regurgitation is mild. Aortic Valve: The aortic valve is abnormal. There is moderate calcification of the aortic valve. Aortic valve regurgitation is trivial. Mild aortic valve sclerosis is present, with no evidence of aortic valve stenosis. Aortic valve mean gradient measures  3.8 mmHg. Aortic valve peak gradient measures 6.1 mmHg. Aortic  valve area, by VTI measures 1.59 cm. Pulmonic Valve: The pulmonic valve was normal in structure. Pulmonic valve regurgitation is trivial. Aorta: The aortic root is normal in size and structure. Venous: The inferior vena cava is normal in size with less than 50% respiratory variability, suggesting right atrial pressure of 8 mmHg. IAS/Shunts: No atrial level shunt detected by color flow Doppler. Agitated saline contrast was given intravenously to evaluate for intracardiac shunting. Agitated saline contrast bubble study was negative, with no evidence of any interatrial shunt.  LEFT VENTRICLE PLAX 2D LVIDd:         4.10 cm LVIDs:         3.80 cm LV PW:         1.20 cm LV IVS:        1.30 cm LVOT diam:     2.00 cm LV SV:         33 LV SV Index:   22 LVOT Area:     3.14 cm  LV Volumes (MOD) LV vol d, MOD A2C: 89.5 ml LV vol d, MOD A4C: 107.0 ml LV vol s, MOD A2C: 62.0 ml LV vol s, MOD A4C: 72.5 ml LV SV MOD A2C:     27.5 ml LV SV MOD A4C:     107.0 ml LV SV MOD BP:      31.0 ml RIGHT VENTRICLE            IVC RV Basal diam:  3.70 cm    IVC diam: 2.00 cm RV Mid diam:    3.10 cm RV S prime:     6.12 cm/s TAPSE (M-mode): 0.6 cm LEFT ATRIUM             Index       RIGHT ATRIUM           Index LA diam:        5.10 cm 3.41 cm/m  RA Area:     13.40 cm LA Vol (A2C):   59.8 ml 39.91 ml/m RA Volume:   34.10 ml  22.77 ml/m LA Vol (A4C):   69.2 ml 46.22 ml/m LA Biplane Vol: 57.5 ml 38.40 ml/m  AORTIC VALVE AV Area (Vmax):    1.75 cm AV Area (Vmean):   1.66 cm AV Area (VTI):     1.59 cm AV Vmax:           123.00 cm/s AV Vmean:          89.175 cm/s AV VTI:            0.210 m AV Peak Grad:      6.1 mmHg AV Mean Grad:      3.8 mmHg LVOT Vmax:         68.38 cm/s LVOT Vmean:        47.100 cm/s LVOT VTI:          0.107 m LVOT/AV VTI ratio: 0.51  AORTA Ao Root diam: 3.00 cm Ao Asc diam:  3.30 cm MITRAL VALVE           TRICUSPID VALVE MV Mean grad: 7.5 mmHg TR Peak grad:   35.8 mmHg                        TR Vmax:        299.00 cm/s                          SHUNTS                        Systemic VTI:  0.11 m                        Systemic Diam: 2.00 cm Cherlynn Kaiser MD Electronically signed by Cherlynn Kaiser MD Signature Date/Time: 03/03/2021/10:24:28 PM    Final (Updated)    CT HEAD CODE STROKE WO CONTRAST  Result Date: 03/03/2021 CLINICAL DATA:  Code stroke. Acute neuro deficit. Right-sided weakness, facial droop EXAM: CT HEAD WITHOUT CONTRAST TECHNIQUE: Contiguous axial images were obtained from the base of the skull through the vertex without intravenous contrast. COMPARISON:  None. FINDINGS: Brain: No evidence of acute infarction, hemorrhage, hydrocephalus, extra-axial collection or mass lesion/mass effect. Mild white matter hypodensity bilaterally in the frontal lobes, symmetric and appearing chronic. Vascular: Negative for hyperdense vessel. Atherosclerotic calcification in the cavernous carotid bilaterally and in the distal vertebral artery bilaterally. Skull: Negative Sinuses/Orbits: Mild mucosal edema ethmoid sinuses bilaterally otherwise clear. Negative orbit Other: None ASPECTS ([place] Stroke Program Early CT Score) - Ganglionic level infarction (caudate, lentiform nuclei, internal capsule, insula, M1-M3 cortex): 7 - Supraganglionic infarction (M4-M6 cortex): 3 Total score (0-10 with 10 being normal): 10 IMPRESSION: 1. No acute intracranial abnormality 2. ASPECTS is 10 3. Mild chronic microvascular ischemic change in the white matter. 4. Code stroke imaging results were communicated on 03/03/2021 at 10:50 am to provider Bhagat via text page Electronically Signed   By: Franchot Gallo M.D.   On: 03/03/2021 10:51    Cardiac Studies      1. The mitral valve has been repaired/replaced. Trivial mitral valve  regurgitation. The mean mitral valve gradient is 7.5 mmHg with average  heart rate of 97 bpm. There is a 27 mm St. Jude bioprosthetic valve  present in the mitral position. Procedure  Date: 11/2020. Echo findings are  consistent with probable prosthetic valve  obstruction the mitral prosthesis. E velocity 2.3 m/s, PHT 250 msec, TVI  ratio 2.3, EOA 1.33 cm2. There is also a      mobile, likely post-op transected chord seen in LV. In the setting of  stroke, consider TEE to further evaluate mitral prosthesis. Calculations  may be impacted by rhythm, HR and low stroke volume.   2. Left ventricular ejection fraction, by estimation, is 45%. The left  ventricle has mildly decreased function. The left ventricle demonstrates  global hypokinesis. There is mild left ventricular hypertrophy. Left  ventricular diastolic function could not   be evaluated.   3. Right ventricular systolic function is mildly reduced. The right  ventricular size is mildly enlarged. There is mildly elevated pulmonary  artery systolic pressure. The estimated right ventricular systolic  pressure is 93.2 mmHg.   4. Left atrial size was mild to moderately dilated.   5. The aortic valve is abnormal. There is moderate calcification of the  aortic valve. Aortic valve regurgitation is trivial. Mild aortic valve  sclerosis is present, with no evidence of aortic valve stenosis.   6. The inferior vena cava is normal in size with <50% respiratory  variability, suggesting right atrial pressure of 8 mmHg.   7. Agitated saline contrast bubble study was negative, with no evidence  of any interatrial shunt.    Patient Profile     TABRINA ESTY is a 75 y.o. 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  Assessment & Plan   Active Problems:   TIA (transient ischemic attack)  Persistent Atrial Flutter Atrial Fibrillation Complete Heart Block s/p BiV PPM - Patient developed post-op atrial fibrillation/flutter following CABG/MVR in 11/2020. Since then she has had DCCV x3 and is on Amiodarone. Presented with TIA. ICD interrogation revealed atrial rate >300 bpm with 3-1 conduction. Patient follows at [facility] for her cardiac care and plan is to transfer her to [facility] when a bed becomes available for possible ablation.  - metoprolol tartrate has been increased to 50mg  twice daily. Takes toprol XL at home.  - Will increase Amiodarone to 200mg  daily. - Eliquis 5mg  twice daily on hold per neurology.    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 LVEF dropped to 30% at time of atrial fibrillation with RVR diagnosis but improved to 50% on last Echo in 01/2021. Patient takes PRN Torsemide at home for weight gain and has needed Torsemide 5/7 days this past week. - received one dose of IV Lasix 40mg  daily. - Continue home Spironolactone 25mg  daily and Jardiance. - Home ACEi was held on admission. Can restart if BP allows. - Continue to monitor volume status closely.   CAD s/p CABG  - S/p CABG x3 in 11/2020.  - High-sensitivity troponin minimally elevated and flat in the 20s. Not consistent with ACS. Likely secondary to TIA and atrial flutter. - No angina.  - Continue aspirin, beta-blocker, statin.   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 -Concern on echocardiogram for possible prosthetic valve obstruction based on calculations.  Challenging in setting of tachycardia and irregular rhythm to get best sense of valve function, however E velocity was elevated, and pressure half-time prolonged.  TVI ratio and EOA impacted by low stroke volume but also abnormal.  In setting of stroke would want to  exclude mitral valve prosthesis abnormalities such as thrombus contribute to stroke as well as picture mitral valve obstruction.  Recommended TEE if patient remains at [facility].  If transferred to, defer management to their cardiology team.  We will coordinate TEE this weekend or early next week.  Patient seemed clear on plan and understands different options.  She is in agreement.  I have spoken to Dr. Fayrene Helper regarding her care and he plans to contact [facility] as well as the patient's daughter to formalize plans.   TIA - Possible related to severe PVD. Head CT showed no acute intracranial findings. - Continue aspirin and Eliquis. - Continue statin.   Otherwise, per primary team: - Diabetes  - MRSA bacteremia - Dementia       For questions or updates, please contact [place] Please consult [email or web address] for contact info under        Signed, Elouise Munroe, MD  03/04/2021, 11:28 AM

## 2021-03-04 NOTE — Discharge Instructions (Signed)

## 2021-03-04 NOTE — Discharge Summary (Signed)
Physician Discharge Summary  Miranda Manning BHA:[id number] DOB: 1946/09/06 DOA: 03/03/2021  PCP: Miranda Pink, MD  Admit date: 03/03/2021 Discharge date: 03/04/2021  Time spent: 40 minutes  Recommendations for [facility] Follow-up:  1. Abnormal TTE here, cardiology recommending TEE in the setting of stroke to further evaluate mitral prosthesis (elevated e velocity and mean gradient concerning for possible valve obstruction) 2. Consult neurology at [facility] on arrival for TIA  3. Eliquis currently on hold, patient on heparin gtt - follow results of TEE prior to restarting eliquis (may need to change anticoagulation regimen based on TEE results - if pt developed thrombus on eliquis) 4. Persistent atrial flutter/fib - plan was for transfer to [facility] to consider ablation - defer to [facility] cardiology in the setting of her TIA (?postpone in setting of TIA) 5. Enalapril 10 mg currently on hold for BP, torsemide currently on hold for BP - metop increaed to 50 mg BID, continuing spironolactone 6. See below for additional details   Discharge Diagnoses:  Active Problems:   TIA (transient ischemic attack)   MRSA bacteremia   S/P CABG x 3   H/O mitral valve replacement   Discharge Condition: stable  Diet recommendation: heart healthy/diabetic   Filed Weights   03/03/21 1000 03/03/21 1043  Weight: 54 kg 54 kg    History of present illness:  Miranda Manning 75 y.o.femalewith medical history significant ofCAD s/p CABG x 3 (11/2020), severe mitral regurgitation status post mitral mitral valve replacement (11/2020 - at time of CABG),ischemic cardiomyopathy(LVEF 30-35%), paroxysmal Miranda Manning. fib on Eliquis, third-degree AV block status post pacemaker, MRSA bacteremiaon Vancomycin (last day 03/06/21), IIDM, who presented with TIA symptoms.  Her daughter notes R sided weakness and dysarthria prior to presentation.  Symptoms lasted about 45 minutes.  She's now back to her baseline (though she notes she's had somewhat  of Miranda Manning decline in her cognition since the CABG/MVR in 11/2020).   Head CT was negative for acute intracranial abnormality on presentation.  Miranda Manning CTA head/neck was negative for intracranial LVO.  Patient was unable to get an MRI brain due to recent temporary pacemaker placement.  Neurology was consulted and is following.  Echo was performed and was notable for abnormalities that could represent possible valve obstruction.  Planning for TEE at this point.  Daughter desires to transfer to [facility] as most of her mother's care is there.  She's been started on heparin gtt in the setting of the possible valve obstruction until this can be evaluated further with TEE.  Initially plan was for possible ablation as well in transfer, will defer this to Cardiology at [facility] in setting of her recent TIA.    See below for additional details     Hospital Course:  TIA - concern for L hemispheric TIA -CT head without acute abnormality -CTA head/neck with advanced atherosclerotic disease, atherosclerotic aortic arch.  75% diameter stenosis proximal L subclavian artery.  R carotid endarterectomy widely patent.  Focal calcific stenosis distal R internal carotid artery approximately 80% diametere stenosis.  This could be embolized calcific fragment vs atherosclerotic disease.  Moderate stenosis right cavernous carotid artery.  Extensive atherosclerotic disease throught L carotid artery.  Approximately 6% diameter stenosis distal L common carotid artery and 70% diameter stenosis proximal L internal carotid artery.  Moderate stenosis L cavernous carotid artery.  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  Mild stenosis distal L vertebral arteryl -carotid US with right carotid with 1-39% stenosis ,  non hemodynamically significant plaque <50% in CCA.  Left carotid with 40-59% stenosis in left ICA, non hemodynamically significant plaque <50% in CCA.  Antegrade flow in R vertebral artery (biphasic  waveform).  Right subclavian artery stenotic.  L subclavian flow disturbed.  - see echo report below - per cardiology, concern for possible prosthetic valve obstruction based on calculations.  In setting of stroke, recommended excluding mitral valve prosthesis abnormalities such as thrombus -> recommended TEE.    - continue aspirin.  Transition to heparin gtt from eliquis given abnormalities on echo above (final anticoagulation plan will depend on TEE results).  - LDL 58, A1c 6.9 -> started on atorvastatin  -Unable to perform MRI due to Miranda Manning new insert temporaryCRT (old PPM was removed 5 weeks ago due to persistentMRSA bacteremia) -PT OT, speech evaluation  Severe Mitral Regurgitation s/p MVR Echo 3/11 abnormal with "mobile, likely post op transected cord seen in LV.  In setting of stroke, consider TEE to further evaluate mitral prosthesis.  Elevated E velocity and mean gradiet may represent valve obstruction, however in setting of tachycardia, challenging to discern" Cards recommended TEE  Miranda Manning flutter, chronic, rateuncontrolled - cardiology consult, appreciate recs - ICD interrogation reviewed atrial rate 300 bpm with 3 to 1 conduction  - ? Plan for ablation, though in the setting of her recent TIA, will discuss whether this needs to be postponed (will defer to [facility]) - continue metoprolol and amiodarone per cardiology  MRSA bacteremia -Continue vancomycin,last dose will be 3/14 -no fevers, signs/symptoms infection - follow TEE  CAD s/p CABG x3 - stable, follow on aspirin, statin, beta blocker  IIDMwith hyperglycemia -Addsliding scale -jardiance  Chronic systolic CHF/ischemic cardiomyopathy -Appears to be euvolemic, spironolactone -ace currently on hold, holding torsemide -continue beta blocker -echo with EF 45% -cardiology c/s, appreciat recs  Hypertension holding ace for now - continuing torsemide/spironolactone for now  Procedures: Echo IMPRESSIONS     1. The mitral valve has been repaired/replaced. Trivial mitral valve  regurgitation. The mean mitral valve gradient is 7.5 mmHg with average  heart rate of 97 bpm. There is Miranda Manning 27 mm St. Jude bioprosthetic valve  present in the mitral position. Procedure  Date: 11/2020. E velocity 2.3 m/s, PHT 70 msec, TVI ratio 2.3, EOA 1.33  cm2. There is also Miranda Manning mobile, likely post-op transected chord seen in LV.  In the setting of stroke, consider TEE to further evaluate mitral  prosthesis. Elevated E velocity and mean  gradient may represent valve obstruction however in setting of tachycardia  challenging to discern.  2. Left ventricular ejection fraction, by estimation, is 45%. The left  ventricle has mildly decreased function. The left ventricle demonstrates  global hypokinesis. There is mild left ventricular hypertrophy. Left  ventricular diastolic function could not  be evaluated.  3. Right ventricular systolic function is mildly reduced. The right  ventricular size is mildly enlarged. There is mildly elevated pulmonary  artery systolic pressure. The estimated right ventricular systolic  pressure is 16.1 mmHg.  4. Left atrial size was mild to moderately dilated.  5. The aortic valve is abnormal. There is moderate calcification of the  aortic valve. Aortic valve regurgitation is trivial. Mild aortic valve  sclerosis is present, with no evidence of aortic valve stenosis.  6. The inferior vena cava is normal in size with <50% respiratory  variability, suggesting right atrial pressure of 8 mmHg.  7. Agitated saline contrast bubble study was negative, with no evidence  of any interatrial shunt.  Carotid US Summary:  Right Carotid: Velocities in the right ICA are consistent with Devinn Voshell 1-39%  stenosis.         Non-hemodynamically significant plaque <50% noted in the  CCA.   Left Carotid: Velocities in the left ICA are consistent with Miranda Manning 40-59%  stenosis.         Non-hemodynamically significant plaque <50% noted in the  CCA.   Vertebrals: Right vertebral artery demonstrates antegrade flow. Miranda Manning        (biphasic) waveform throughout.  Subclavians: Right subclavian artery was stenotic. Left subclavian artery  flow        was disturbed.   *See table(s) above for measurements and observations.   Consultations:  Cardiology - discussed transfer today with Dr. Purvis Sheffield  neurology  Discharge Exam: Vitals:   03/04/21 1202 03/04/21 1557  BP: 131/86 129/60  Pulse: 81 69  Resp: 18 18  Temp: 97.7 F (36.5 C) 97.9 F (36.6 C)  SpO2: 96% 93%    See todays progress note  Discharge Instructions   Discharge Instructions    Call MD for:  difficulty breathing, headache or visual disturbances   Complete by: As directed    Call MD for:  extreme fatigue   Complete by: As directed    Call MD for:  hives   Complete by: As directed    Call MD for:  persistant dizziness or light-headedness   Complete by: As directed    Call MD for:  persistant nausea and vomiting   Complete by: As directed    Call MD for:  redness, tenderness, or signs of infection (pain, swelling, redness, odor or green/yellow discharge around incision site)   Complete by: As directed    Call MD for:  severe uncontrolled pain   Complete by: As directed    Call MD for:  temperature >100.4   Complete by: As directed    Diet - low sodium heart healthy   Complete by: As directed    Discharge instructions   Complete by: As directed    You are being transferred to [facility] for continued care.    You're being transferred for possible TEE and ablation.    You were found to have Onofre Gains TIA.  Your echo showed Miranda Manning possible mitral valve obstruction.   We're planning to transfer to [facility] for further cardiology and neurology follow up given your extensive care there.   Increase activity slowly   Complete by: As directed      Allergies as of 03/04/2021      Reactions    Wound Dressing Adhesive Other (See Comments)   Patient has VERY thin skin on her LOWER EXTREMITIES, use IV3000 and when removing dressings/bandages, use anti-adhesive spray!   Codeine Rash      Medication List    STOP taking these medications   Eliquis 5 MG Tabs tablet Generic drug: apixaban   enalapril 10 MG tablet Commonly known as: VASOTEC   ezetimibe 10 MG tablet Commonly known as: ZETIA   metoprolol succinate 50 MG 24 hr tablet Commonly known as: TOPROL-XL   simvastatin 40 MG tablet Commonly known as: Zocor   torsemide 10 MG tablet Commonly known as: DEMADEX     TAKE these medications   albuterol 108 (90 Base) MCG/ACT inhaler Commonly known as: VENTOLIN HFA Inhale 2 puffs into the lungs every 6 (six) hours as needed for wheezing or shortness of breath.   amiodarone 200 MG tablet Commonly known as: PACERONE Take 1  tablet (200 mg total) by mouth in the morning. Start taking on: March 05, 2021 What changed: how much to take   aspirin 81 MG tablet Take 81 mg by mouth in the morning.   atorvastatin 40 MG tablet Commonly known as: LIPITOR Take 40 mg by mouth at bedtime.   CALCIUM-CARB 600 PO Take 600 mg by mouth daily.   Centrum Silver 50+Women Tabs Take 1 tablet by mouth daily with breakfast.   clobetasol ointment 0.05 % Commonly known as: TEMOVATE Apply 1 application topically 2 (two) times daily as needed (for irritation).   Cranberry-Vitamin C-Vitamin E 4200-20-3 MG-MG-UNIT Caps Take 1 capsule by mouth in the morning.   empagliflozin 10 MG Tabs tablet Commonly known as: JARDIANCE Take 10 mg by mouth daily.   heparin 25000 UT/250ML infusion Inject 700 Units/hr into the vein continuous. Start taking on: March 05, 2021   magnesium oxide 400 MG tablet Commonly known as: MAG-OX Take 400 mg by mouth 2 (two) times daily.   metoprolol tartrate 50 MG tablet Commonly known as: LOPRESSOR Take 1 tablet (50 mg total) by mouth 2 (two) times daily.    mirtazapine 7.5 mg Tabs tablet Commonly known as: REMERON Take 15 mg by mouth at bedtime.   potassium chloride 20 MEQ packet Commonly known as: KLOR-CON Take 20-40 mEq by mouth See admin instructions. Take 20-40 mEq by mouth up to two times Mickeal Daws day, AS DIRECTED, only when taking Torsemide   spironolactone 25 MG tablet Commonly known as: ALDACTONE Take 25 mg by mouth daily.   vancomycin 5000 MG injection Commonly known as: VANCOCIN Inject 1 g into the vein daily at 6 PM. Last dose is on 03/06/2021   vitamin B-12 1000 MCG tablet Commonly known as: CYANOCOBALAMIN Take 1,000 mcg by mouth daily.   Vitamin D3 10 MCG (400 UNIT) Caps Take 400 Units by mouth daily.      Allergies  Allergen Reactions  . Wound Dressing Adhesive Other (See Comments)    Patient has VERY thin skin on her LOWER EXTREMITIES, use IV3000 and when removing dressings/bandages, use anti-adhesive spray!  . Codeine Rash      The results of significant diagnostics from this hospitalization (including imaging, microbiology, ancillary and laboratory) are listed below for reference.    Significant Diagnostic Studies: CT ANGIO HEAD W OR WO CONTRAST  Result Date: 03/03/2021 CLINICAL DATA:  Acute neuro deficit. Right-sided weakness and facial droop EXAM: CT ANGIOGRAPHY HEAD AND NECK TECHNIQUE: Multidetector CT imaging of the head and neck was performed using the standard protocol during bolus administration of intravenous contrast. Multiplanar CT image reconstructions and MIPs were obtained to evaluate the vascular anatomy. Carotid stenosis measurements (when applicable) are obtained utilizing NASCET criteria, using the distal internal carotid diameter as the denominator. CONTRAST:  3mL OMNIPAQUE IOHEXOL 350 MG/ML SOLN COMPARISON:  CT head 03/03/2021 FINDINGS: CTA NECK FINDINGS Aortic arch: Extensive atherosclerotic calcification aortic arch without aneurysm. Atherosclerotic calcification in the proximal great vessels. 75%  diameter stenosis proximal left subclavian artery. Right carotid system: Mild atherosclerotic calcification right common carotid artery without stenosis. Postop right carotid endarterectomy which is widely patent. Focal calcification within the right internal carotid artery approximately 2 cm below the skull base. This has an unusual appearance in that there is no surrounding plaque. This appears to be intraluminal in location and is causing significant stenosis. There is minimal opacified lumen adjacent to the calcification. Estimated 80% diameter stenosis. Left carotid system: Extensive atherosclerotic calcification throughout the left common carotid artery  with multiple areas of 50% diameter stenosis. Extensive atherosclerotic calcification left carotid bifurcation and proximal left internal carotid artery. Approximately 60% diameter stenosis distal left common carotid artery and 70% diameter stenosis proximal left internal carotid artery. Vertebral arteries: Left vertebral artery dominant and patent to the basilar. Mild stenosis distal left vertebral artery Occlusion of the proximal right vertebral artery which reconstitutes and is patent to the basilar. Moderate to severe calcific stenosis at the C1 level on the right. Skeleton: Cervical spondylosis.  No acute skeletal abnormality. Other neck: No soft tissue mass.  Prominent jugular vein bilaterally Upper chest: Severe apical emphysema bilaterally. No pneumothorax or infiltrate. Precarinal lymph nodes up to 15 mm in diameter. AP window lymph node 11 mm in diameter. Review of the MIP images confirms the above findings CTA HEAD FINDINGS Anterior circulation: Extensive atherosclerotic disease throughout the cavernous carotid bilaterally with moderate stenosis bilaterally. Anterior and middle cerebral arteries patent bilaterally without large vessel occlusion or significant stenosis. Posterior circulation: Mild stenosis distal left vertebral artery moderate to  severe stenosis distal right vertebral artery. Both vertebral arteries patent to the basilar. Basilar is patent. Superior cerebellar and posterior cerebral arteries patent bilaterally. Fetal origin right posterior cerebral artery. Venous sinuses: Normal venous enhancement Anatomic variants: None Review of the MIP images confirms the above findings IMPRESSION: 1. Negative for intracranial large vessel occlusion 2. Advanced atherosclerotic disease. Atherosclerotic aortic arch. 75% diameter stenosis proximal left subclavian artery 3. Right carotid endarterectomy widely patent. Focal calcific stenosis distal right internal carotid artery approximately 80% diameter stenosis. This could be an embolized calcific fragment versus atherosclerotic disease. Moderate stenosis right cavernous carotid artery. 4. Extensive atherosclerotic disease throughout the left carotid artery. Approximately 60% diameter stenosis distal left common carotid artery and 70% diameter stenosis proximal left internal carotid artery. Moderate stenosis left cavernous carotid artery. 5. 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 Mild stenosis distal left vertebral artery. 6. Severe apical emphysema. Electronically Signed   By: Miranda Manning M.D.   On: 03/03/2021 11:25   CT HEAD WO CONTRAST  Result Date: 03/03/2021 CLINICAL DATA:  Left facial droop EXAM: CT HEAD WITHOUT CONTRAST TECHNIQUE: Contiguous axial images were obtained from the base of the skull through the vertex without intravenous contrast. COMPARISON:  03/03/2021 FINDINGS: Brain: There is no mass, hemorrhage or extra-axial collection. The size and configuration of the ventricles and extra-axial CSF spaces are normal. There is hypoattenuation of the white matter, most commonly indicating chronic small vessel disease. Vascular: No abnormal hyperdensity of the major intracranial arteries or dural venous sinuses. No intracranial atherosclerosis.  Skull: The visualized skull base, calvarium and extracranial soft tissues are normal. Sinuses/Orbits: No fluid levels or advanced mucosal thickening of the visualized paranasal sinuses. No mastoid or middle Manning effusion. The orbits are normal. IMPRESSION: Chronic small vessel disease without acute intracranial abnormality. Electronically Signed   By: Miranda Manning M.D.   On: 03/03/2021 20:08   CT ANGIO NECK W OR WO CONTRAST  Result Date: 03/03/2021 CLINICAL DATA:  Acute neuro deficit. Right-sided weakness and facial droop EXAM: CT ANGIOGRAPHY HEAD AND NECK TECHNIQUE: Multidetector CT imaging of the head and neck was performed using the standard protocol during bolus administration of intravenous contrast. Multiplanar CT image reconstructions and MIPs were obtained to evaluate the vascular anatomy. Carotid stenosis measurements (when applicable) are obtained utilizing NASCET criteria, using the distal internal carotid diameter as the denominator. CONTRAST:  27mL OMNIPAQUE IOHEXOL 350 MG/ML SOLN COMPARISON:  CT  head 03/03/2021 FINDINGS: CTA NECK FINDINGS Aortic arch: Extensive atherosclerotic calcification aortic arch without aneurysm. Atherosclerotic calcification in the proximal great vessels. 75% diameter stenosis proximal left subclavian artery. Right carotid system: Mild atherosclerotic calcification right common carotid artery without stenosis. Postop right carotid endarterectomy which is widely patent. Focal calcification within the right internal carotid artery approximately 2 cm below the skull base. This has an unusual appearance in that there is no surrounding plaque. This appears to be intraluminal in location and is causing significant stenosis. There is minimal opacified lumen adjacent to the calcification. Estimated 80% diameter stenosis. Left carotid system: Extensive atherosclerotic calcification throughout the left common carotid artery with multiple areas of 50% diameter stenosis. Extensive  atherosclerotic calcification left carotid bifurcation and proximal left internal carotid artery. Approximately 60% diameter stenosis distal left common carotid artery and 70% diameter stenosis proximal left internal carotid artery. Vertebral arteries: Left vertebral artery dominant and patent to the basilar. Mild stenosis distal left vertebral artery Occlusion of the proximal right vertebral artery which reconstitutes and is patent to the basilar. Moderate to severe calcific stenosis at the C1 level on the right. Skeleton: Cervical spondylosis.  No acute skeletal abnormality. Other neck: No soft tissue mass.  Prominent jugular vein bilaterally Upper chest: Severe apical emphysema bilaterally. No pneumothorax or infiltrate. Precarinal lymph nodes up to 15 mm in diameter. AP window lymph node 11 mm in diameter. Review of the MIP images confirms the above findings CTA HEAD FINDINGS Anterior circulation: Extensive atherosclerotic disease throughout the cavernous carotid bilaterally with moderate stenosis bilaterally. Anterior and middle cerebral arteries patent bilaterally without large vessel occlusion or significant stenosis. Posterior circulation: Mild stenosis distal left vertebral artery moderate to severe stenosis distal right vertebral artery. Both vertebral arteries patent to the basilar. Basilar is patent. Superior cerebellar and posterior cerebral arteries patent bilaterally. Fetal origin right posterior cerebral artery. Venous sinuses: Normal venous enhancement Anatomic variants: None Review of the MIP images confirms the above findings IMPRESSION: 1. Negative for intracranial large vessel occlusion 2. Advanced atherosclerotic disease. Atherosclerotic aortic arch. 75% diameter stenosis proximal left subclavian artery 3. Right carotid endarterectomy widely patent. Focal calcific stenosis distal right internal carotid artery approximately 80% diameter stenosis. This could be an embolized calcific fragment  versus atherosclerotic disease. Moderate stenosis right cavernous carotid artery. 4. Extensive atherosclerotic disease throughout the left carotid artery. Approximately 60% diameter stenosis distal left common carotid artery and 70% diameter stenosis proximal left internal carotid artery. Moderate stenosis left cavernous carotid artery. 5. 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 Mild stenosis distal left vertebral artery. 6. Severe apical emphysema. Electronically Signed   By: Miranda Manning M.D.   On: 03/03/2021 11:25   DG Chest Port 1 View  Result Date: 03/03/2021 CLINICAL DATA:  Left-sided facial droop and right arm weakness. History of pulmonary fibrosis and emphysema. EXAM: PORTABLE CHEST 1 VIEW COMPARISON:  September 14, 2014 FINDINGS: Miranda Manning multi lead AICD is present. Multiple sternal wires are seen. Chronic appearing increased interstitial lung markings are noted. There is no evidence of acute infiltrate, pleural effusion or pneumothorax. The cardiac silhouette is moderately enlarged. Marked severity calcification of the aortic arch is seen. No acute osseous abnormalities are seen. IMPRESSION: Cardiomegaly and chronic interstitial lung disease, without evidence of acute or active cardiopulmonary disease. Electronically Signed   By: Virgina Norfolk M.D.   On: 03/03/2021 15:27   ECHOCARDIOGRAM COMPLETE BUBBLE STUDY  Result Date: 03/04/2021    ECHOCARDIOGRAM REPORT  Patient Name:   Miranda Manning Date of Exam: 03/03/2021 Medical Rec #:  [id number]    Height:       60.0 in Accession #:    [id number]   Weight:       119.0 lb Date of Birth:  Jul 10, 1946     BSA:          1.497 m Patient Age:    52 years     BP:           71/56 mmHg Patient Gender: F            HR:           107 bpm. Exam Location:  Inpatient Procedure: 2D Echo, Cardiac Doppler, Color Doppler and Saline Contrast Bubble            Study                               MODIFIED REPORT: This report was  modified by Miranda Kaiser MD on 03/04/2021 due to revision.  Indications:     Stroke  History:         Patient has prior history of Echocardiogram examinations, most                  recent 06/01/2014. CAD, Prior CABG and Pacemaker, Mitral Valve                  Disease; Risk Factors:Former Smoker, Hypertension, Diabetes and                  Dyslipidemia. S/P mitral valve replacement.                   Mitral Valve: 27 mm St. Jude bioprosthetic valve valve is                  present in the mitral position. Procedure Date: 11/2020.  Sonographer:     Clayton Lefort RDCS (AE) Referring Phys:  [id number] Lorenza Chick Diagnosing Phys: Miranda Kaiser MD IMPRESSIONS  1. The mitral valve has been repaired/replaced. Trivial mitral valve regurgitation. The mean mitral valve gradient is 7.5 mmHg with average heart rate of 97 bpm. There is Miranda Manning 27 mm St. Jude bioprosthetic valve present in the mitral position. Procedure Date: 11/2020. E velocity 2.3 m/s, PHT 70 msec, TVI ratio 2.3, EOA 1.33 cm2. There is also Miranda Manning mobile, likely post-op transected chord seen in LV. In the setting of stroke, consider TEE to further evaluate mitral prosthesis. Elevated E velocity and mean gradient may represent valve obstruction however in setting of tachycardia challenging to discern.  2. Left ventricular ejection fraction, by estimation, is 45%. The left ventricle has mildly decreased function. The left ventricle demonstrates global hypokinesis. There is mild left ventricular hypertrophy. Left ventricular diastolic function could not  be evaluated.  3. Right ventricular systolic function is mildly reduced. The right ventricular size is mildly enlarged. There is mildly elevated pulmonary artery systolic pressure. The estimated right ventricular systolic pressure is 90.2 mmHg.  4. Left atrial size was mild to moderately dilated.  5. The aortic valve is abnormal. There is moderate calcification of the aortic valve. Aortic valve regurgitation is trivial.  Mild aortic valve sclerosis is present, with no evidence of aortic valve stenosis.  6. The inferior vena cava is normal in size with <50% respiratory variability, suggesting right atrial pressure of 8 mmHg.  7.  Agitated saline contrast bubble study was negative, with no evidence of any interatrial shunt. FINDINGS  Left Ventricle: Left ventricular ejection fraction, by estimation, is 45%. The left ventricle has mildly decreased function. The left ventricle demonstrates global hypokinesis. The left ventricular internal cavity size was normal in size. There is mild left ventricular hypertrophy. Abnormal (paradoxical) septal motion consistent with post-operative status and abnormal (paradoxical) septal motion, consistent with RV pacemaker. Left ventricular diastolic function could not be evaluated due to mitral valve replacement. Left ventricular diastolic function could not be evaluated. Right Ventricle: The right ventricular size is mildly enlarged. No increase in right ventricular wall thickness. Right ventricular systolic function is mildly reduced. There is mildly elevated pulmonary artery systolic pressure. The tricuspid regurgitant  velocity is 2.99 m/s, and with an assumed right atrial pressure of 8 mmHg, the estimated right ventricular systolic pressure is 15.1 mmHg. Left Atrium: Left atrial size was mild to moderately dilated. Right Atrium: Right atrial size was normal in size. Pericardium: There is no evidence of pericardial effusion. Mitral Valve: The mitral valve has been repaired/replaced. Trivial mitral valve regurgitation. There is Karalina Tift 27 mm St. Jude bioprosthetic valve present in the mitral position. Procedure Date: 11/2020. 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 The mean mitral valve gradient is 7.5 mmHg with average heart rate of 97 bpm. Tricuspid Valve: The tricuspid valve is normal in structure. Tricuspid valve regurgitation is mild. Aortic  Valve: The aortic valve is abnormal. There is moderate calcification of the aortic valve. Aortic valve regurgitation is trivial. Mild aortic valve sclerosis is present, with no evidence of aortic valve stenosis. Aortic valve mean gradient measures  3.8 mmHg. Aortic valve peak gradient measures 6.1 mmHg. Aortic valve area, by VTI measures 1.59 cm. Pulmonic Valve: The pulmonic valve was normal in structure. Pulmonic valve regurgitation is trivial. Aorta: The aortic root is normal in size and structure. Venous: The inferior vena cava is normal in size with less than 50% respiratory variability, suggesting right atrial pressure of 8 mmHg. IAS/Shunts: No atrial level shunt detected by color flow Doppler. Agitated saline contrast was given intravenously to evaluate for intracardiac shunting. Agitated saline contrast bubble study was negative, with no evidence of any interatrial shunt.  LEFT VENTRICLE PLAX 2D LVIDd:         4.10 cm LVIDs:         3.80 cm LV PW:         1.20 cm LV IVS:        1.30 cm LVOT diam:     2.00 cm LV SV:         33 LV SV Index:   22 LVOT Area:     3.14 cm  LV Volumes (MOD) LV vol d, MOD A2C: 89.5 ml LV vol d, MOD A4C: 107.0 ml LV vol s, MOD A2C: 62.0 ml LV vol s, MOD A4C: 72.5 ml LV SV MOD A2C:     27.5 ml LV SV MOD A4C:     107.0 ml LV SV MOD BP:      31.0 ml RIGHT VENTRICLE            IVC RV Basal diam:  3.70 cm    IVC diam: 2.00 cm RV Mid diam:    3.10 cm RV S prime:     6.12 cm/s TAPSE (M-mode): 0.6 cm LEFT ATRIUM             Index  RIGHT ATRIUM           Index LA diam:        5.10 cm 3.41 cm/m  RA Area:     13.40 cm LA Vol (A2C):   59.8 ml 39.91 ml/m RA Volume:   34.10 ml  22.77 ml/m LA Vol (A4C):   69.2 ml 46.22 ml/m LA Biplane Vol: 57.5 ml 38.40 ml/m  AORTIC VALVE AV Area (Vmax):    1.75 cm AV Area (Vmean):   1.66 cm AV Area (VTI):     1.59 cm AV Vmax:           123.00 cm/s AV Vmean:          89.175 cm/s AV VTI:            0.210 m AV Peak Grad:      6.1 mmHg AV Mean Grad:       3.8 mmHg LVOT Vmax:         68.38 cm/s LVOT Vmean:        47.100 cm/s LVOT VTI:          0.107 m LVOT/AV VTI ratio: 0.51  AORTA Ao Root diam: 3.00 cm Ao Asc diam:  3.30 cm MITRAL VALVE           TRICUSPID VALVE MV Mean grad: 7.5 mmHg TR Peak grad:   35.8 mmHg                        TR Vmax:        299.00 cm/s                         SHUNTS                        Systemic VTI:  0.11 m                        Systemic Diam: 2.00 cm Miranda Kaiser MD Electronically signed by Miranda Kaiser MD Signature Date/Time: 03/03/2021/10:24:28 PM    Final (Updated)    CT HEAD CODE STROKE WO CONTRAST  Result Date: 03/03/2021 CLINICAL DATA:  Code stroke. Acute neuro deficit. Right-sided weakness, facial droop EXAM: CT HEAD WITHOUT CONTRAST TECHNIQUE: Contiguous axial images were obtained from the base of the skull through the vertex without intravenous contrast. COMPARISON:  None. FINDINGS: Brain: No evidence of acute infarction, hemorrhage, hydrocephalus, extra-axial collection or mass lesion/mass effect. Mild white matter hypodensity bilaterally in the frontal lobes, symmetric and appearing chronic. Vascular: Negative for hyperdense vessel. Atherosclerotic calcification in the cavernous carotid bilaterally and in the distal vertebral artery bilaterally. Skull: Negative Sinuses/Orbits: Mild mucosal edema ethmoid sinuses bilaterally otherwise clear. Negative orbit Other: None ASPECTS (Dooms Stroke Program Early CT Score) - Ganglionic level infarction (caudate, lentiform nuclei, internal capsule, insula, M1-M3 cortex): 7 - Supraganglionic infarction (M4-M6 cortex): 3 Total score (0-10 with 10 being normal): 10 IMPRESSION: 1. No acute intracranial abnormality 2. ASPECTS is 10 3. Mild chronic microvascular ischemic change in the white matter. 4. Code stroke imaging results were communicated on 03/03/2021 at 10:50 am to provider Bhagat via text page Electronically Signed   By: Miranda Manning M.D.   On: 03/03/2021 10:51   VAS US  CAROTID  Result Date: 03/04/2021 Carotid Arterial Duplex Study Indications:       TIA, CVA and Carotid artery disease. Risk Factors:  Hypertension, hyperlipidemia, Diabetes, past history of                    smoking, coronary artery disease. Comparison Study:  Prev 11/2019 Post CEA R 1-39 L 40-59 Performing Technologist: Vonzell Schlatter RVT  Examination Guidelines: Chrystine Frogge complete evaluation includes B-mode imaging, spectral Doppler, color Doppler, and power Doppler as needed of all accessible portions of each vessel. Bilateral testing is considered an integral part of Jessi Jessop complete examination. Limited examinations for reoccurring indications may be performed as noted.  Right Carotid Findings: +----------+--------+--------+--------+-------------------------+--------+           PSV cm/sEDV cm/sStenosisPlaque Description       Comments +----------+--------+--------+--------+-------------------------+--------+ CCA Prox  152     23                                                +----------+--------+--------+--------+-------------------------+--------+ CCA Distal95      14      <50%    heterogenous and calcific         +----------+--------+--------+--------+-------------------------+--------+ ICA Prox  67      22      1-39%   heterogenous             Post CEA +----------+--------+--------+--------+-------------------------+--------+ ICA Distal73      25                                                +----------+--------+--------+--------+-------------------------+--------+ ECA       196     22                                                +----------+--------+--------+--------+-------------------------+--------+ +----------+--------+-------+--------+-------------------+           PSV cm/sEDV cmsDescribeArm Pressure (mmHG) +----------+--------+-------+--------+-------------------+ [id number]            Stenotic                     +----------+--------+-------+--------+-------------------+ +---------+--------+--+--------+-+ VertebralPSV cm/s34EDV cm/s7 +---------+--------+--+--------+-+ Angling up under Jaw towards intracranial segment of ICA there is Dalani Mette large irregular piece of calcific plaque that does not appear to cause Donaldo Teegarden hemodynamic change with velocity. Left Carotid Findings: +----------+--------+--------+--------+-------------------------+--------+           PSV cm/sEDV cm/sStenosisPlaque Description       Comments +----------+--------+--------+--------+-------------------------+--------+ CCA Prox  132     36                                                +----------+--------+--------+--------+-------------------------+--------+ CCA Distal183     32      <50%    calcific and heterogenous         +----------+--------+--------+--------+-------------------------+--------+ ICA Prox  137     34      40-59%  calcific                          +----------+--------+--------+--------+-------------------------+--------+ ICA Distal67  19                                                +----------+--------+--------+--------+-------------------------+--------+ ECA       79      12                                                +----------+--------+--------+--------+-------------------------+--------+ +----------+--------+--------+-----------------------------+-------------------+           PSV cm/sEDV cm/sDescribe                     Arm Pressure (mmHG) +----------+--------+--------+-----------------------------+-------------------+ [id number]     43      CTA shows proximal off arch                                                severe stenosis                                  +----------+--------+--------+-----------------------------+-------------------+ +---------+--------+--------+------------------+ VertebralPSV cm/sEDV cm/sBunny Manning waveform  +---------+--------+--------+------------------+   Summary: Right Carotid: Velocities in the right ICA are consistent with Cantrell Larouche 1-39% stenosis.                Non-hemodynamically significant plaque <50% noted in the CCA. Left Carotid: Velocities in the left ICA are consistent with Libertie Hausler 40-59% stenosis.               Non-hemodynamically significant plaque <50% noted in the CCA. Vertebrals:  Right vertebral artery demonstrates antegrade flow. Miranda Manning              (biphasic) waveform throughout. Subclavians: Right subclavian artery was stenotic. Left subclavian artery flow              was disturbed. *See table(s) above for measurements and observations.  Electronically signed by Antony Contras MD on 03/04/2021 at 12:57:57 PM.    Final     Microbiology: Recent Results (from the past 240 hour(s))  Resp Panel by RT-PCR (Flu Nasir Bright&B, Covid) Nasopharyngeal Swab     Status: None   Collection Time: 03/03/21  2:07 PM   Specimen: Nasopharyngeal Swab; Nasopharyngeal(NP) swabs in vial transport medium  Result Value Ref Range Status   SARS Coronavirus 2 by RT PCR NEGATIVE NEGATIVE Final    Comment: (NOTE) SARS-CoV-2 target nucleic acids are NOT DETECTED.  The SARS-CoV-2 RNA is generally detectable in upper respiratory specimens during the acute phase of infection. The lowest concentration of SARS-CoV-2 viral copies this assay can detect is 138 copies/mL. Ashad Fawbush negative result does not preclude SARS-Cov-2 infection and should not be used as the sole basis for treatment or other patient management decisions. Akyia Borelli negative result may occur with  improper specimen collection/handling, submission of specimen other than nasopharyngeal swab, presence of viral mutation(s) within the areas targeted by this assay, and inadequate number of viral copies(<138 copies/mL). Khristopher Kapaun negative result must be combined with clinical observations, patient history, and epidemiological information. The expected result is Negative.  Fact Sheet for  Patients:  EntrepreneurPulse.com.au  Fact Sheet  for Healthcare Providers:  IncredibleEmployment.be  This test is no t yet approved or cleared by the [place] and  has been authorized for detection and/or diagnosis of SARS-CoV-2 by FDA under an Emergency Use Authorization (EUA). This EUA will remain  in effect (meaning this test can be used) for the duration of the COVID-19 declaration under Section 564(b)(1) of the Act, 21 U.S.C.section 360bbb-3(b)(1), unless the authorization is terminated  or revoked sooner.       Influenza Jonpaul Lumm by PCR NEGATIVE NEGATIVE Final   Influenza B by PCR NEGATIVE NEGATIVE Final    Comment: (NOTE) The Xpert Xpress SARS-CoV-2/FLU/RSV plus assay is intended as an aid in the diagnosis of influenza from Nasopharyngeal swab specimens and should not be used as Lashe Oliveira sole basis for treatment. Nasal washings and aspirates are unacceptable for Xpert Xpress SARS-CoV-2/FLU/RSV testing.  Fact Sheet for Patients: EntrepreneurPulse.com.au  Fact Sheet for Healthcare Providers: IncredibleEmployment.be  This test is not yet approved or cleared by the [place] FDA and has been authorized for detection and/or diagnosis of SARS-CoV-2 by FDA under an Emergency Use Authorization (EUA). This EUA will remain in effect (meaning this test can be used) for the duration of the COVID-19 declaration under Section 564(b)(1) of the Act, 21 U.S.C. section 360bbb-3(b)(1), unless the authorization is terminated or revoked.  Performed at [facility], [place] [address] [place], [place] [address]      Labs: Basic Metabolic Panel: Recent Labs  Lab 03/03/21 1040 03/03/21 1049 03/03/21 1830 03/04/21 1243  NA 135 136  --  134*  K 4.3 4.3  --  3.3*  CL 102 104  --  101  CO2 25  --   --  24  GLUCOSE 209* 199*  --  100*  BUN 21 25*  --  19  CREATININE 0.78 0.70  --  0.73  CALCIUM 10.2  --   --   10.1  MG  --   --  2.1  --    Liver Function Tests: Recent Labs  Lab 03/03/21 1040 03/04/21 1243  AST 32 33  ALT 27 28  ALKPHOS 79 71  BILITOT 0.6 1.1  PROT 6.2* 5.9*  ALBUMIN 3.4* 3.2*   No results for input(s): LIPASE, AMYLASE in the last 168 hours. No results for input(s): AMMONIA in the last 168 hours. CBC: Recent Labs  Lab 03/03/21 1040 03/03/21 1049 03/04/21 1243  WBC 7.7  --  7.3  NEUTROABS 6.1  --  5.5  HGB 10.9* 11.2* 10.6*  HCT 36.5 33.0* 33.9*  MCV 86.7  --  83.3  PLT 248  --  287   Cardiac Enzymes: No results for input(s): CKTOTAL, CKMB, CKMBINDEX, TROPONINI in the last 168 hours. BNP: BNP (last 3 results) No results for input(s): BNP in the last 8760 hours.  ProBNP (last 3 results) No results for input(s): PROBNP in the last 8760 hours.  CBG: Recent Labs  Lab 03/03/21 1641 03/03/21 2116 03/04/21 0630 03/04/21 1203 03/04/21 1645  GLUCAP 122* 89 111* 99 113*       Signed:  Fayrene Helper MD.  [facility] 03/04/2021, 7:23 PM

## 2021-03-04 NOTE — Progress Notes (Addendum)
Al[place] for heparin Indication: atrial fibrillation  Allergies  Allergen Reactions  . Wound Dressing Adhesive Other (See Comments)    Patient has VERY thin skin on her LOWER EXTREMITIES, use IV3000 and when removing dressings/bandages, use anti-adhesive spray!  . Codeine Rash    Patient Measurements: Height: 5' (152.4 cm) Weight: 54 kg (119 lb 0.8 oz) IBW/kg (Calculated) : 45.5 Heparin Dosing Weight: 54kg  Vital Signs: Temp: 97.7 F (36.5 C) (03/12 1202) Temp Source: Axillary (03/12 1202) BP: 131/86 (03/12 1202) Pulse Rate: 81 (03/12 1202)  Labs: Recent Labs    03/03/21 1040 03/03/21 1049 03/03/21 1110 03/03/21 1310 03/04/21 1243  HGB 10.9* 11.2*  --   --  10.6*  HCT 36.5 33.0*  --   --  33.9*  PLT 248  --   --   --  287  APTT 27  --   --   --   --   LABPROT 17.7*  --   --   --   --   INR 1.5*  --   --   --   --   CREATININE 0.78 0.70  --   --  0.73  TROPONINIHS  --   --  24* 23*  --     Estimated Creatinine Clearance: 43 mL/min (by C-G formula based on SCr of 0.73 mg/dL).   Medical History: Past Medical History:  Diagnosis Date  . Anxiety   . CAD (coronary artery disease)   . Cataract    only in one eye per pt.  . Cerebrovascular disease   . Colon polyp   . Diverticulosis   . Emphysema lung (HCC)    no COPD per pt! - good lung fx, centrilobular emphysema on CT  . LBBB (left bundle branch block)   . Mitral regurgitation    Severe  . Other and unspecified hyperlipidemia   . Personal history of tobacco use, presenting hazards to health   . Pulmonary fibrosis ([place])    per pt, does not have -  seen on abd CT 2015  . Type II or unspecified type diabetes mellitus without mention of complication, not stated as uncontrolled   . Unspecified essential hypertension   . Vertigo    in past     Assessment: 71 yoF with hx AFib on apixaban at home admitted with TIA. CT negative for bleed. Pharmacy asked to begin IV  heparin while apixaban held and further testing continues. Noted that apixaban was given 3/11 pm and this afternoon at 1442 so will delay heparin initiation x12h and check aPTTs.  Goal of Therapy:   Heparin level 0.3-0.5 units/ml aPTT 66-85 seconds Monitor platelets by anticoagulation protocol: Yes   Plan:  -Heparin 700 units/hr no bolus starting at 0330 3/13 (daylight savings time) -Check 8-hr heparin level -Monitor heparin level, CBC, S/Sx bleeding daily  Arrie Senate, PharmD, BCPS, [facility] Clinical Pharmacist [phone] Please check AMION for all [facility] numbers 03/04/2021

## 2021-03-04 NOTE — Progress Notes (Signed)
SLP Cancellation Note  Patient Details Name: LARITA DEREMER MRN: [id number] DOB: May 12, 1946   Cancelled treatment:       Reason Eval/Treat Not Completed: Patient at procedure or test/unavailable  Gabriel Rainwater MA, CCC-SLP   Jennavecia Schwier Meryl 03/04/2021, 10:37 AM

## 2021-03-05 DIAGNOSIS — I255 Ischemic cardiomyopathy: Secondary | ICD-10-CM | POA: Diagnosis not present

## 2021-03-05 DIAGNOSIS — I447 Left bundle-branch block, unspecified: Secondary | ICD-10-CM | POA: Diagnosis not present

## 2021-03-05 DIAGNOSIS — E7800 Pure hypercholesterolemia, unspecified: Secondary | ICD-10-CM | POA: Diagnosis not present

## 2021-03-05 DIAGNOSIS — R627 Adult failure to thrive: Secondary | ICD-10-CM | POA: Diagnosis not present

## 2021-03-05 DIAGNOSIS — F4321 Adjustment disorder with depressed mood: Secondary | ICD-10-CM | POA: Diagnosis not present

## 2021-03-05 DIAGNOSIS — I6523 Occlusion and stenosis of bilateral carotid arteries: Secondary | ICD-10-CM | POA: Diagnosis not present

## 2021-03-05 DIAGNOSIS — I519 Heart disease, unspecified: Secondary | ICD-10-CM | POA: Diagnosis not present

## 2021-03-05 DIAGNOSIS — T826XXA Infection and inflammatory reaction due to cardiac valve prosthesis, initial encounter: Secondary | ICD-10-CM | POA: Diagnosis not present

## 2021-03-05 DIAGNOSIS — R32 Unspecified urinary incontinence: Secondary | ICD-10-CM | POA: Diagnosis not present

## 2021-03-05 DIAGNOSIS — Z789 Other specified health status: Secondary | ICD-10-CM | POA: Diagnosis not present

## 2021-03-05 DIAGNOSIS — I4892 Unspecified atrial flutter: Secondary | ICD-10-CM | POA: Diagnosis not present

## 2021-03-05 DIAGNOSIS — G459 Transient cerebral ischemic attack, unspecified: Secondary | ICD-10-CM | POA: Diagnosis not present

## 2021-03-05 DIAGNOSIS — Z7189 Other specified counseling: Secondary | ICD-10-CM | POA: Diagnosis not present

## 2021-03-05 DIAGNOSIS — F0280 Dementia in other diseases classified elsewhere without behavioral disturbance: Secondary | ICD-10-CM | POA: Diagnosis not present

## 2021-03-05 DIAGNOSIS — I739 Peripheral vascular disease, unspecified: Secondary | ICD-10-CM | POA: Diagnosis not present

## 2021-03-05 DIAGNOSIS — E785 Hyperlipidemia, unspecified: Secondary | ICD-10-CM | POA: Diagnosis not present

## 2021-03-05 DIAGNOSIS — I442 Atrioventricular block, complete: Secondary | ICD-10-CM | POA: Diagnosis not present

## 2021-03-05 DIAGNOSIS — R5381 Other malaise: Secondary | ICD-10-CM | POA: Diagnosis not present

## 2021-03-05 DIAGNOSIS — Z4659 Encounter for fitting and adjustment of other gastrointestinal appliance and device: Secondary | ICD-10-CM | POA: Diagnosis not present

## 2021-03-05 DIAGNOSIS — I6389 Other cerebral infarction: Secondary | ICD-10-CM | POA: Diagnosis not present

## 2021-03-05 DIAGNOSIS — I38 Endocarditis, valve unspecified: Secondary | ICD-10-CM | POA: Diagnosis not present

## 2021-03-05 DIAGNOSIS — A4102 Sepsis due to Methicillin resistant Staphylococcus aureus: Secondary | ICD-10-CM | POA: Diagnosis not present

## 2021-03-05 DIAGNOSIS — J439 Emphysema, unspecified: Secondary | ICD-10-CM | POA: Diagnosis not present

## 2021-03-05 DIAGNOSIS — E876 Hypokalemia: Secondary | ICD-10-CM | POA: Diagnosis not present

## 2021-03-05 DIAGNOSIS — E43 Unspecified severe protein-calorie malnutrition: Secondary | ICD-10-CM | POA: Diagnosis not present

## 2021-03-05 DIAGNOSIS — B9562 Methicillin resistant Staphylococcus aureus infection as the cause of diseases classified elsewhere: Secondary | ICD-10-CM | POA: Diagnosis not present

## 2021-03-05 DIAGNOSIS — E1151 Type 2 diabetes mellitus with diabetic peripheral angiopathy without gangrene: Secondary | ICD-10-CM | POA: Diagnosis not present

## 2021-03-05 DIAGNOSIS — I4819 Other persistent atrial fibrillation: Secondary | ICD-10-CM | POA: Diagnosis not present

## 2021-03-05 DIAGNOSIS — Z792 Long term (current) use of antibiotics: Secondary | ICD-10-CM | POA: Diagnosis not present

## 2021-03-05 DIAGNOSIS — R109 Unspecified abdominal pain: Secondary | ICD-10-CM | POA: Diagnosis not present

## 2021-03-05 DIAGNOSIS — I2510 Atherosclerotic heart disease of native coronary artery without angina pectoris: Secondary | ICD-10-CM | POA: Diagnosis not present

## 2021-03-05 DIAGNOSIS — K5730 Diverticulosis of large intestine without perforation or abscess without bleeding: Secondary | ICD-10-CM | POA: Diagnosis not present

## 2021-03-05 DIAGNOSIS — J9811 Atelectasis: Secondary | ICD-10-CM | POA: Diagnosis not present

## 2021-03-05 DIAGNOSIS — R918 Other nonspecific abnormal finding of lung field: Secondary | ICD-10-CM | POA: Diagnosis not present

## 2021-03-05 DIAGNOSIS — R4189 Other symptoms and signs involving cognitive functions and awareness: Secondary | ICD-10-CM | POA: Diagnosis not present

## 2021-03-05 DIAGNOSIS — I483 Typical atrial flutter: Secondary | ICD-10-CM | POA: Diagnosis not present

## 2021-03-05 DIAGNOSIS — Z45018 Encounter for adjustment and management of other part of cardiac pacemaker: Secondary | ICD-10-CM | POA: Diagnosis not present

## 2021-03-05 DIAGNOSIS — I771 Stricture of artery: Secondary | ICD-10-CM | POA: Diagnosis not present

## 2021-03-05 DIAGNOSIS — Z681 Body mass index (BMI) 19 or less, adult: Secondary | ICD-10-CM | POA: Diagnosis not present

## 2021-03-05 DIAGNOSIS — I5033 Acute on chronic diastolic (congestive) heart failure: Secondary | ICD-10-CM | POA: Diagnosis not present

## 2021-03-05 DIAGNOSIS — R1312 Dysphagia, oropharyngeal phase: Secondary | ICD-10-CM | POA: Diagnosis not present

## 2021-03-05 DIAGNOSIS — I340 Nonrheumatic mitral (valve) insufficiency: Secondary | ICD-10-CM | POA: Diagnosis not present

## 2021-03-05 DIAGNOSIS — Z20822 Contact with and (suspected) exposure to covid-19: Secondary | ICD-10-CM | POA: Diagnosis not present

## 2021-03-05 DIAGNOSIS — I110 Hypertensive heart disease with heart failure: Secondary | ICD-10-CM | POA: Diagnosis not present

## 2021-03-05 DIAGNOSIS — I5023 Acute on chronic systolic (congestive) heart failure: Secondary | ICD-10-CM | POA: Diagnosis not present

## 2021-03-05 DIAGNOSIS — T826XXD Infection and inflammatory reaction due to cardiac valve prosthesis, subsequent encounter: Secondary | ICD-10-CM | POA: Diagnosis not present

## 2021-03-05 DIAGNOSIS — R630 Anorexia: Secondary | ICD-10-CM | POA: Diagnosis not present

## 2021-03-05 DIAGNOSIS — E639 Nutritional deficiency, unspecified: Secondary | ICD-10-CM | POA: Diagnosis not present

## 2021-03-05 DIAGNOSIS — D509 Iron deficiency anemia, unspecified: Secondary | ICD-10-CM | POA: Diagnosis not present

## 2021-03-05 DIAGNOSIS — R64 Cachexia: Secondary | ICD-10-CM | POA: Diagnosis not present

## 2021-03-05 DIAGNOSIS — I69351 Hemiplegia and hemiparesis following cerebral infarction affecting right dominant side: Secondary | ICD-10-CM | POA: Diagnosis not present

## 2021-03-05 DIAGNOSIS — R7881 Bacteremia: Secondary | ICD-10-CM | POA: Diagnosis not present

## 2021-03-05 DIAGNOSIS — E119 Type 2 diabetes mellitus without complications: Secondary | ICD-10-CM | POA: Diagnosis not present

## 2021-03-05 DIAGNOSIS — I708 Atherosclerosis of other arteries: Secondary | ICD-10-CM | POA: Diagnosis not present

## 2021-03-05 DIAGNOSIS — I330 Acute and subacute infective endocarditis: Secondary | ICD-10-CM | POA: Diagnosis not present

## 2021-03-05 DIAGNOSIS — Z7901 Long term (current) use of anticoagulants: Secondary | ICD-10-CM | POA: Diagnosis not present

## 2021-03-05 DIAGNOSIS — Z950 Presence of cardiac pacemaker: Secondary | ICD-10-CM | POA: Diagnosis not present

## 2021-03-05 DIAGNOSIS — Z9889 Other specified postprocedural states: Secondary | ICD-10-CM | POA: Diagnosis not present

## 2021-03-05 DIAGNOSIS — R0602 Shortness of breath: Secondary | ICD-10-CM | POA: Diagnosis not present

## 2021-03-05 DIAGNOSIS — Z452 Encounter for adjustment and management of vascular access device: Secondary | ICD-10-CM | POA: Diagnosis not present

## 2021-03-05 DIAGNOSIS — R6521 Severe sepsis with septic shock: Secondary | ICD-10-CM | POA: Diagnosis not present

## 2021-03-05 DIAGNOSIS — G309 Alzheimer's disease, unspecified: Secondary | ICD-10-CM | POA: Diagnosis not present

## 2021-03-05 DIAGNOSIS — I639 Cerebral infarction, unspecified: Secondary | ICD-10-CM | POA: Diagnosis not present

## 2021-03-05 DIAGNOSIS — Z515 Encounter for palliative care: Secondary | ICD-10-CM | POA: Diagnosis not present

## 2021-03-05 DIAGNOSIS — Z978 Presence of other specified devices: Secondary | ICD-10-CM | POA: Diagnosis not present

## 2021-03-05 DIAGNOSIS — Z951 Presence of aortocoronary bypass graft: Secondary | ICD-10-CM | POA: Diagnosis not present

## 2021-03-05 DIAGNOSIS — L89150 Pressure ulcer of sacral region, unstageable: Secondary | ICD-10-CM | POA: Diagnosis not present

## 2021-03-05 DIAGNOSIS — F05 Delirium due to known physiological condition: Secondary | ICD-10-CM | POA: Diagnosis not present

## 2021-03-05 DIAGNOSIS — E46 Unspecified protein-calorie malnutrition: Secondary | ICD-10-CM | POA: Diagnosis not present

## 2021-03-05 DIAGNOSIS — Z66 Do not resuscitate: Secondary | ICD-10-CM | POA: Diagnosis not present

## 2021-03-05 LAB — CBC
HCT: 31.4 % — ABNORMAL LOW (ref 36.0–46.0)
Hemoglobin: 10 g/dL — ABNORMAL LOW (ref 12.0–15.0)
MCH: 26.6 pg (ref 26.0–34.0)
MCHC: 31.8 g/dL (ref 30.0–36.0)
MCV: 83.5 fL (ref 80.0–100.0)
Platelets: 291 10*3/uL (ref 150–400)
RBC: 3.76 MIL/uL — ABNORMAL LOW (ref 3.87–5.11)
RDW: 20.6 % — ABNORMAL HIGH (ref 11.5–15.5)
WBC: 7.2 10*3/uL (ref 4.0–10.5)
nRBC: 0 % (ref 0.0–0.2)

## 2021-03-05 NOTE — Progress Notes (Signed)
Patient just picked up by [facility] escort staffs at 0500 am today, A&Ox4, no s/s distress, denied of any discomfort, stable, VTs are WNL, given discharge teaching to patient  and handed discharge handout. [name] handed to EMS escort staffs in patient's presence. Given report to patient receiving RN, Mr. Freida Busman of [facility]. Patient is going to admit in room [place]. [name] has been notified.

## 2021-03-06 DIAGNOSIS — Z7901 Long term (current) use of anticoagulants: Secondary | ICD-10-CM | POA: Diagnosis not present

## 2021-03-06 DIAGNOSIS — I5033 Acute on chronic diastolic (congestive) heart failure: Secondary | ICD-10-CM | POA: Diagnosis not present

## 2021-03-06 DIAGNOSIS — I447 Left bundle-branch block, unspecified: Secondary | ICD-10-CM | POA: Diagnosis not present

## 2021-03-06 DIAGNOSIS — I2510 Atherosclerotic heart disease of native coronary artery without angina pectoris: Secondary | ICD-10-CM | POA: Diagnosis not present

## 2021-03-06 DIAGNOSIS — B9562 Methicillin resistant Staphylococcus aureus infection as the cause of diseases classified elsewhere: Secondary | ICD-10-CM | POA: Diagnosis not present

## 2021-03-06 DIAGNOSIS — Z45018 Encounter for adjustment and management of other part of cardiac pacemaker: Secondary | ICD-10-CM | POA: Diagnosis not present

## 2021-03-06 DIAGNOSIS — R7881 Bacteremia: Secondary | ICD-10-CM | POA: Diagnosis not present

## 2021-03-07 DIAGNOSIS — Z950 Presence of cardiac pacemaker: Secondary | ICD-10-CM | POA: Diagnosis not present

## 2021-03-07 DIAGNOSIS — I6523 Occlusion and stenosis of bilateral carotid arteries: Secondary | ICD-10-CM | POA: Diagnosis not present

## 2021-03-07 DIAGNOSIS — I6389 Other cerebral infarction: Secondary | ICD-10-CM | POA: Diagnosis not present

## 2021-03-07 DIAGNOSIS — Z45018 Encounter for adjustment and management of other part of cardiac pacemaker: Secondary | ICD-10-CM | POA: Diagnosis not present

## 2021-03-07 DIAGNOSIS — I447 Left bundle-branch block, unspecified: Secondary | ICD-10-CM | POA: Diagnosis not present

## 2021-03-07 DIAGNOSIS — I69351 Hemiplegia and hemiparesis following cerebral infarction affecting right dominant side: Secondary | ICD-10-CM | POA: Diagnosis not present

## 2021-03-07 DIAGNOSIS — G459 Transient cerebral ischemic attack, unspecified: Secondary | ICD-10-CM | POA: Diagnosis not present

## 2021-03-08 DIAGNOSIS — R7881 Bacteremia: Secondary | ICD-10-CM | POA: Diagnosis not present

## 2021-03-08 DIAGNOSIS — B9562 Methicillin resistant Staphylococcus aureus infection as the cause of diseases classified elsewhere: Secondary | ICD-10-CM | POA: Diagnosis not present

## 2021-03-08 DIAGNOSIS — Z452 Encounter for adjustment and management of vascular access device: Secondary | ICD-10-CM | POA: Diagnosis not present

## 2021-03-09 DIAGNOSIS — R4189 Other symptoms and signs involving cognitive functions and awareness: Secondary | ICD-10-CM | POA: Diagnosis not present

## 2021-03-09 DIAGNOSIS — E43 Unspecified severe protein-calorie malnutrition: Secondary | ICD-10-CM | POA: Diagnosis not present

## 2021-03-09 DIAGNOSIS — R627 Adult failure to thrive: Secondary | ICD-10-CM | POA: Diagnosis not present

## 2021-03-10 DIAGNOSIS — E43 Unspecified severe protein-calorie malnutrition: Secondary | ICD-10-CM | POA: Diagnosis not present

## 2021-03-10 DIAGNOSIS — R4189 Other symptoms and signs involving cognitive functions and awareness: Secondary | ICD-10-CM | POA: Diagnosis not present

## 2021-03-10 DIAGNOSIS — E7800 Pure hypercholesterolemia, unspecified: Secondary | ICD-10-CM | POA: Diagnosis not present

## 2021-03-10 DIAGNOSIS — I255 Ischemic cardiomyopathy: Secondary | ICD-10-CM | POA: Diagnosis not present

## 2021-03-10 DIAGNOSIS — G459 Transient cerebral ischemic attack, unspecified: Secondary | ICD-10-CM | POA: Diagnosis not present

## 2021-03-10 DIAGNOSIS — I4892 Unspecified atrial flutter: Secondary | ICD-10-CM | POA: Diagnosis not present

## 2021-03-10 DIAGNOSIS — R627 Adult failure to thrive: Secondary | ICD-10-CM | POA: Diagnosis not present

## 2021-03-10 DIAGNOSIS — I483 Typical atrial flutter: Secondary | ICD-10-CM | POA: Diagnosis not present

## 2021-03-10 DIAGNOSIS — I639 Cerebral infarction, unspecified: Secondary | ICD-10-CM | POA: Diagnosis not present

## 2021-03-10 DIAGNOSIS — I2510 Atherosclerotic heart disease of native coronary artery without angina pectoris: Secondary | ICD-10-CM | POA: Diagnosis not present

## 2021-03-11 DIAGNOSIS — I739 Peripheral vascular disease, unspecified: Secondary | ICD-10-CM | POA: Diagnosis not present

## 2021-03-11 DIAGNOSIS — I2510 Atherosclerotic heart disease of native coronary artery without angina pectoris: Secondary | ICD-10-CM | POA: Diagnosis not present

## 2021-03-11 DIAGNOSIS — G459 Transient cerebral ischemic attack, unspecified: Secondary | ICD-10-CM | POA: Diagnosis not present

## 2021-03-11 DIAGNOSIS — I447 Left bundle-branch block, unspecified: Secondary | ICD-10-CM | POA: Diagnosis not present

## 2021-03-11 DIAGNOSIS — Z7901 Long term (current) use of anticoagulants: Secondary | ICD-10-CM | POA: Diagnosis not present

## 2021-03-11 DIAGNOSIS — I6523 Occlusion and stenosis of bilateral carotid arteries: Secondary | ICD-10-CM | POA: Diagnosis not present

## 2021-03-11 DIAGNOSIS — I483 Typical atrial flutter: Secondary | ICD-10-CM | POA: Diagnosis not present

## 2021-03-12 DIAGNOSIS — I483 Typical atrial flutter: Secondary | ICD-10-CM | POA: Diagnosis not present

## 2021-03-12 DIAGNOSIS — G459 Transient cerebral ischemic attack, unspecified: Secondary | ICD-10-CM | POA: Diagnosis not present

## 2021-03-12 DIAGNOSIS — I739 Peripheral vascular disease, unspecified: Secondary | ICD-10-CM | POA: Diagnosis not present

## 2021-03-12 DIAGNOSIS — I6523 Occlusion and stenosis of bilateral carotid arteries: Secondary | ICD-10-CM | POA: Diagnosis not present

## 2021-03-12 DIAGNOSIS — I447 Left bundle-branch block, unspecified: Secondary | ICD-10-CM | POA: Diagnosis not present

## 2021-03-12 DIAGNOSIS — I2510 Atherosclerotic heart disease of native coronary artery without angina pectoris: Secondary | ICD-10-CM | POA: Diagnosis not present

## 2021-03-12 DIAGNOSIS — Z7901 Long term (current) use of anticoagulants: Secondary | ICD-10-CM | POA: Diagnosis not present

## 2021-03-13 DIAGNOSIS — I447 Left bundle-branch block, unspecified: Secondary | ICD-10-CM | POA: Diagnosis not present

## 2021-03-13 DIAGNOSIS — I739 Peripheral vascular disease, unspecified: Secondary | ICD-10-CM | POA: Diagnosis not present

## 2021-03-13 DIAGNOSIS — G459 Transient cerebral ischemic attack, unspecified: Secondary | ICD-10-CM | POA: Diagnosis not present

## 2021-03-13 DIAGNOSIS — I2510 Atherosclerotic heart disease of native coronary artery without angina pectoris: Secondary | ICD-10-CM | POA: Diagnosis not present

## 2021-03-13 DIAGNOSIS — I483 Typical atrial flutter: Secondary | ICD-10-CM | POA: Diagnosis not present

## 2021-03-13 DIAGNOSIS — Z7901 Long term (current) use of anticoagulants: Secondary | ICD-10-CM | POA: Diagnosis not present

## 2021-03-13 DIAGNOSIS — I6523 Occlusion and stenosis of bilateral carotid arteries: Secondary | ICD-10-CM | POA: Diagnosis not present

## 2021-03-14 DIAGNOSIS — I483 Typical atrial flutter: Secondary | ICD-10-CM | POA: Diagnosis not present

## 2021-03-14 DIAGNOSIS — I2510 Atherosclerotic heart disease of native coronary artery without angina pectoris: Secondary | ICD-10-CM | POA: Diagnosis not present

## 2021-03-14 DIAGNOSIS — G459 Transient cerebral ischemic attack, unspecified: Secondary | ICD-10-CM | POA: Diagnosis not present

## 2021-03-14 DIAGNOSIS — I447 Left bundle-branch block, unspecified: Secondary | ICD-10-CM | POA: Diagnosis not present

## 2021-03-14 DIAGNOSIS — Z7901 Long term (current) use of anticoagulants: Secondary | ICD-10-CM | POA: Diagnosis not present

## 2021-03-14 DIAGNOSIS — I739 Peripheral vascular disease, unspecified: Secondary | ICD-10-CM | POA: Diagnosis not present

## 2021-03-14 DIAGNOSIS — I6523 Occlusion and stenosis of bilateral carotid arteries: Secondary | ICD-10-CM | POA: Diagnosis not present

## 2021-03-15 DIAGNOSIS — I739 Peripheral vascular disease, unspecified: Secondary | ICD-10-CM | POA: Diagnosis not present

## 2021-03-15 DIAGNOSIS — R109 Unspecified abdominal pain: Secondary | ICD-10-CM | POA: Diagnosis not present

## 2021-03-15 DIAGNOSIS — J439 Emphysema, unspecified: Secondary | ICD-10-CM | POA: Diagnosis not present

## 2021-03-15 DIAGNOSIS — I447 Left bundle-branch block, unspecified: Secondary | ICD-10-CM | POA: Diagnosis not present

## 2021-03-15 DIAGNOSIS — I2510 Atherosclerotic heart disease of native coronary artery without angina pectoris: Secondary | ICD-10-CM | POA: Diagnosis not present

## 2021-03-15 DIAGNOSIS — G459 Transient cerebral ischemic attack, unspecified: Secondary | ICD-10-CM | POA: Diagnosis not present

## 2021-03-15 DIAGNOSIS — I6523 Occlusion and stenosis of bilateral carotid arteries: Secondary | ICD-10-CM | POA: Diagnosis not present

## 2021-03-15 DIAGNOSIS — I483 Typical atrial flutter: Secondary | ICD-10-CM | POA: Diagnosis not present

## 2021-03-15 DIAGNOSIS — Z950 Presence of cardiac pacemaker: Secondary | ICD-10-CM | POA: Diagnosis not present

## 2021-03-15 DIAGNOSIS — Z7901 Long term (current) use of anticoagulants: Secondary | ICD-10-CM | POA: Diagnosis not present

## 2021-03-16 DIAGNOSIS — I483 Typical atrial flutter: Secondary | ICD-10-CM | POA: Diagnosis not present

## 2021-03-16 DIAGNOSIS — R7881 Bacteremia: Secondary | ICD-10-CM | POA: Diagnosis not present

## 2021-03-16 DIAGNOSIS — I739 Peripheral vascular disease, unspecified: Secondary | ICD-10-CM | POA: Diagnosis not present

## 2021-03-16 DIAGNOSIS — I447 Left bundle-branch block, unspecified: Secondary | ICD-10-CM | POA: Diagnosis not present

## 2021-03-16 DIAGNOSIS — Z7901 Long term (current) use of anticoagulants: Secondary | ICD-10-CM | POA: Diagnosis not present

## 2021-03-16 DIAGNOSIS — B9562 Methicillin resistant Staphylococcus aureus infection as the cause of diseases classified elsewhere: Secondary | ICD-10-CM | POA: Diagnosis not present

## 2021-03-16 DIAGNOSIS — I2510 Atherosclerotic heart disease of native coronary artery without angina pectoris: Secondary | ICD-10-CM | POA: Diagnosis not present

## 2021-03-16 DIAGNOSIS — G459 Transient cerebral ischemic attack, unspecified: Secondary | ICD-10-CM | POA: Diagnosis not present

## 2021-03-16 DIAGNOSIS — I6523 Occlusion and stenosis of bilateral carotid arteries: Secondary | ICD-10-CM | POA: Diagnosis not present

## 2021-03-17 DIAGNOSIS — B9562 Methicillin resistant Staphylococcus aureus infection as the cause of diseases classified elsewhere: Secondary | ICD-10-CM | POA: Diagnosis not present

## 2021-03-17 DIAGNOSIS — I483 Typical atrial flutter: Secondary | ICD-10-CM | POA: Diagnosis not present

## 2021-03-17 DIAGNOSIS — Z7901 Long term (current) use of anticoagulants: Secondary | ICD-10-CM | POA: Diagnosis not present

## 2021-03-17 DIAGNOSIS — I739 Peripheral vascular disease, unspecified: Secondary | ICD-10-CM | POA: Diagnosis not present

## 2021-03-17 DIAGNOSIS — I6523 Occlusion and stenosis of bilateral carotid arteries: Secondary | ICD-10-CM | POA: Diagnosis not present

## 2021-03-17 DIAGNOSIS — I2510 Atherosclerotic heart disease of native coronary artery without angina pectoris: Secondary | ICD-10-CM | POA: Diagnosis not present

## 2021-03-17 DIAGNOSIS — R7881 Bacteremia: Secondary | ICD-10-CM | POA: Diagnosis not present

## 2021-03-17 DIAGNOSIS — G459 Transient cerebral ischemic attack, unspecified: Secondary | ICD-10-CM | POA: Diagnosis not present

## 2021-03-17 DIAGNOSIS — I447 Left bundle-branch block, unspecified: Secondary | ICD-10-CM | POA: Diagnosis not present

## 2021-03-18 DIAGNOSIS — G309 Alzheimer's disease, unspecified: Secondary | ICD-10-CM | POA: Diagnosis not present

## 2021-03-18 DIAGNOSIS — I2510 Atherosclerotic heart disease of native coronary artery without angina pectoris: Secondary | ICD-10-CM | POA: Diagnosis not present

## 2021-03-18 DIAGNOSIS — F0280 Dementia in other diseases classified elsewhere without behavioral disturbance: Secondary | ICD-10-CM | POA: Diagnosis not present

## 2021-03-18 DIAGNOSIS — I519 Heart disease, unspecified: Secondary | ICD-10-CM | POA: Diagnosis not present

## 2021-03-18 DIAGNOSIS — R5381 Other malaise: Secondary | ICD-10-CM | POA: Diagnosis not present

## 2021-03-18 DIAGNOSIS — I447 Left bundle-branch block, unspecified: Secondary | ICD-10-CM | POA: Diagnosis not present

## 2021-03-18 DIAGNOSIS — R4189 Other symptoms and signs involving cognitive functions and awareness: Secondary | ICD-10-CM | POA: Diagnosis not present

## 2021-03-18 DIAGNOSIS — I5033 Acute on chronic diastolic (congestive) heart failure: Secondary | ICD-10-CM | POA: Diagnosis not present

## 2021-03-18 DIAGNOSIS — I6523 Occlusion and stenosis of bilateral carotid arteries: Secondary | ICD-10-CM | POA: Diagnosis not present

## 2021-03-19 DIAGNOSIS — F0280 Dementia in other diseases classified elsewhere without behavioral disturbance: Secondary | ICD-10-CM | POA: Diagnosis not present

## 2021-03-19 DIAGNOSIS — R5381 Other malaise: Secondary | ICD-10-CM | POA: Diagnosis not present

## 2021-03-19 DIAGNOSIS — A4102 Sepsis due to Methicillin resistant Staphylococcus aureus: Secondary | ICD-10-CM | POA: Diagnosis not present

## 2021-03-19 DIAGNOSIS — I447 Left bundle-branch block, unspecified: Secondary | ICD-10-CM | POA: Diagnosis not present

## 2021-03-19 DIAGNOSIS — G309 Alzheimer's disease, unspecified: Secondary | ICD-10-CM | POA: Diagnosis not present

## 2021-03-19 DIAGNOSIS — I5033 Acute on chronic diastolic (congestive) heart failure: Secondary | ICD-10-CM | POA: Diagnosis not present

## 2021-03-19 DIAGNOSIS — I38 Endocarditis, valve unspecified: Secondary | ICD-10-CM | POA: Diagnosis not present

## 2021-03-19 DIAGNOSIS — I2510 Atherosclerotic heart disease of native coronary artery without angina pectoris: Secondary | ICD-10-CM | POA: Diagnosis not present

## 2021-03-19 DIAGNOSIS — T826XXA Infection and inflammatory reaction due to cardiac valve prosthesis, initial encounter: Secondary | ICD-10-CM | POA: Diagnosis not present

## 2021-03-19 DIAGNOSIS — R4189 Other symptoms and signs involving cognitive functions and awareness: Secondary | ICD-10-CM | POA: Diagnosis not present

## 2021-03-19 DIAGNOSIS — I519 Heart disease, unspecified: Secondary | ICD-10-CM | POA: Diagnosis not present

## 2021-03-19 DIAGNOSIS — I6523 Occlusion and stenosis of bilateral carotid arteries: Secondary | ICD-10-CM | POA: Diagnosis not present

## 2021-03-20 DIAGNOSIS — Z7189 Other specified counseling: Secondary | ICD-10-CM | POA: Diagnosis not present

## 2021-03-20 DIAGNOSIS — R5381 Other malaise: Secondary | ICD-10-CM | POA: Diagnosis not present

## 2021-03-20 DIAGNOSIS — R4189 Other symptoms and signs involving cognitive functions and awareness: Secondary | ICD-10-CM | POA: Diagnosis not present

## 2021-03-20 DIAGNOSIS — I447 Left bundle-branch block, unspecified: Secondary | ICD-10-CM | POA: Diagnosis not present

## 2021-03-20 DIAGNOSIS — I519 Heart disease, unspecified: Secondary | ICD-10-CM | POA: Diagnosis not present

## 2021-03-20 DIAGNOSIS — I38 Endocarditis, valve unspecified: Secondary | ICD-10-CM | POA: Diagnosis not present

## 2021-03-20 DIAGNOSIS — A4102 Sepsis due to Methicillin resistant Staphylococcus aureus: Secondary | ICD-10-CM | POA: Diagnosis not present

## 2021-03-20 DIAGNOSIS — I2510 Atherosclerotic heart disease of native coronary artery without angina pectoris: Secondary | ICD-10-CM | POA: Diagnosis not present

## 2021-03-20 DIAGNOSIS — G309 Alzheimer's disease, unspecified: Secondary | ICD-10-CM | POA: Diagnosis not present

## 2021-03-20 DIAGNOSIS — F0280 Dementia in other diseases classified elsewhere without behavioral disturbance: Secondary | ICD-10-CM | POA: Diagnosis not present

## 2021-03-20 DIAGNOSIS — I6523 Occlusion and stenosis of bilateral carotid arteries: Secondary | ICD-10-CM | POA: Diagnosis not present

## 2021-03-20 DIAGNOSIS — T826XXA Infection and inflammatory reaction due to cardiac valve prosthesis, initial encounter: Secondary | ICD-10-CM | POA: Diagnosis not present

## 2021-03-20 DIAGNOSIS — I5033 Acute on chronic diastolic (congestive) heart failure: Secondary | ICD-10-CM | POA: Diagnosis not present

## 2021-03-21 DIAGNOSIS — G309 Alzheimer's disease, unspecified: Secondary | ICD-10-CM | POA: Diagnosis not present

## 2021-03-21 DIAGNOSIS — I5033 Acute on chronic diastolic (congestive) heart failure: Secondary | ICD-10-CM | POA: Diagnosis not present

## 2021-03-21 DIAGNOSIS — I447 Left bundle-branch block, unspecified: Secondary | ICD-10-CM | POA: Diagnosis not present

## 2021-03-21 DIAGNOSIS — I6523 Occlusion and stenosis of bilateral carotid arteries: Secondary | ICD-10-CM | POA: Diagnosis not present

## 2021-03-21 DIAGNOSIS — R630 Anorexia: Secondary | ICD-10-CM | POA: Diagnosis not present

## 2021-03-21 DIAGNOSIS — R5381 Other malaise: Secondary | ICD-10-CM | POA: Diagnosis not present

## 2021-03-21 DIAGNOSIS — Z7189 Other specified counseling: Secondary | ICD-10-CM | POA: Diagnosis not present

## 2021-03-21 DIAGNOSIS — I519 Heart disease, unspecified: Secondary | ICD-10-CM | POA: Diagnosis not present

## 2021-03-21 DIAGNOSIS — F0280 Dementia in other diseases classified elsewhere without behavioral disturbance: Secondary | ICD-10-CM | POA: Diagnosis not present

## 2021-03-21 DIAGNOSIS — R4189 Other symptoms and signs involving cognitive functions and awareness: Secondary | ICD-10-CM | POA: Diagnosis not present

## 2021-03-21 DIAGNOSIS — I2510 Atherosclerotic heart disease of native coronary artery without angina pectoris: Secondary | ICD-10-CM | POA: Diagnosis not present

## 2021-03-22 DIAGNOSIS — I447 Left bundle-branch block, unspecified: Secondary | ICD-10-CM | POA: Diagnosis not present

## 2021-03-22 DIAGNOSIS — I2510 Atherosclerotic heart disease of native coronary artery without angina pectoris: Secondary | ICD-10-CM | POA: Diagnosis not present

## 2021-03-22 DIAGNOSIS — G309 Alzheimer's disease, unspecified: Secondary | ICD-10-CM | POA: Diagnosis not present

## 2021-03-22 DIAGNOSIS — I5033 Acute on chronic diastolic (congestive) heart failure: Secondary | ICD-10-CM | POA: Diagnosis not present

## 2021-03-22 DIAGNOSIS — I519 Heart disease, unspecified: Secondary | ICD-10-CM | POA: Diagnosis not present

## 2021-03-22 DIAGNOSIS — F0280 Dementia in other diseases classified elsewhere without behavioral disturbance: Secondary | ICD-10-CM | POA: Diagnosis not present

## 2021-03-22 DIAGNOSIS — R5381 Other malaise: Secondary | ICD-10-CM | POA: Diagnosis not present

## 2021-03-22 DIAGNOSIS — R4189 Other symptoms and signs involving cognitive functions and awareness: Secondary | ICD-10-CM | POA: Diagnosis not present

## 2021-03-22 DIAGNOSIS — I6523 Occlusion and stenosis of bilateral carotid arteries: Secondary | ICD-10-CM | POA: Diagnosis not present

## 2021-03-22 DIAGNOSIS — Z7189 Other specified counseling: Secondary | ICD-10-CM | POA: Diagnosis not present

## 2021-03-23 DIAGNOSIS — I447 Left bundle-branch block, unspecified: Secondary | ICD-10-CM | POA: Diagnosis not present

## 2021-03-23 DIAGNOSIS — T826XXA Infection and inflammatory reaction due to cardiac valve prosthesis, initial encounter: Secondary | ICD-10-CM | POA: Diagnosis not present

## 2021-03-23 DIAGNOSIS — Z7189 Other specified counseling: Secondary | ICD-10-CM | POA: Diagnosis not present

## 2021-03-23 DIAGNOSIS — I2510 Atherosclerotic heart disease of native coronary artery without angina pectoris: Secondary | ICD-10-CM | POA: Diagnosis not present

## 2021-03-23 DIAGNOSIS — F0280 Dementia in other diseases classified elsewhere without behavioral disturbance: Secondary | ICD-10-CM | POA: Diagnosis not present

## 2021-03-23 DIAGNOSIS — A4102 Sepsis due to Methicillin resistant Staphylococcus aureus: Secondary | ICD-10-CM | POA: Diagnosis not present

## 2021-03-23 DIAGNOSIS — R5381 Other malaise: Secondary | ICD-10-CM | POA: Diagnosis not present

## 2021-03-23 DIAGNOSIS — I519 Heart disease, unspecified: Secondary | ICD-10-CM | POA: Diagnosis not present

## 2021-03-23 DIAGNOSIS — G309 Alzheimer's disease, unspecified: Secondary | ICD-10-CM | POA: Diagnosis not present

## 2021-03-23 DIAGNOSIS — I5033 Acute on chronic diastolic (congestive) heart failure: Secondary | ICD-10-CM | POA: Diagnosis not present

## 2021-03-23 DIAGNOSIS — I38 Endocarditis, valve unspecified: Secondary | ICD-10-CM | POA: Diagnosis not present

## 2021-03-23 DIAGNOSIS — I6523 Occlusion and stenosis of bilateral carotid arteries: Secondary | ICD-10-CM | POA: Diagnosis not present

## 2021-03-23 DIAGNOSIS — R4189 Other symptoms and signs involving cognitive functions and awareness: Secondary | ICD-10-CM | POA: Diagnosis not present

## 2021-03-23 DIAGNOSIS — Z792 Long term (current) use of antibiotics: Secondary | ICD-10-CM | POA: Diagnosis not present

## 2021-03-24 DIAGNOSIS — Z7189 Other specified counseling: Secondary | ICD-10-CM | POA: Diagnosis not present

## 2021-03-24 DIAGNOSIS — R5381 Other malaise: Secondary | ICD-10-CM | POA: Diagnosis not present

## 2021-03-24 DIAGNOSIS — I6523 Occlusion and stenosis of bilateral carotid arteries: Secondary | ICD-10-CM | POA: Diagnosis not present

## 2021-03-24 DIAGNOSIS — I2510 Atherosclerotic heart disease of native coronary artery without angina pectoris: Secondary | ICD-10-CM | POA: Diagnosis not present

## 2021-03-24 DIAGNOSIS — R4189 Other symptoms and signs involving cognitive functions and awareness: Secondary | ICD-10-CM | POA: Diagnosis not present

## 2021-03-24 DIAGNOSIS — F0280 Dementia in other diseases classified elsewhere without behavioral disturbance: Secondary | ICD-10-CM | POA: Diagnosis not present

## 2021-03-24 DIAGNOSIS — I447 Left bundle-branch block, unspecified: Secondary | ICD-10-CM | POA: Diagnosis not present

## 2021-03-24 DIAGNOSIS — I519 Heart disease, unspecified: Secondary | ICD-10-CM | POA: Diagnosis not present

## 2021-03-24 DIAGNOSIS — I5033 Acute on chronic diastolic (congestive) heart failure: Secondary | ICD-10-CM | POA: Diagnosis not present

## 2021-03-24 DIAGNOSIS — G309 Alzheimer's disease, unspecified: Secondary | ICD-10-CM | POA: Diagnosis not present

## 2021-03-25 DIAGNOSIS — I447 Left bundle-branch block, unspecified: Secondary | ICD-10-CM | POA: Diagnosis not present

## 2021-03-26 DIAGNOSIS — Z452 Encounter for adjustment and management of vascular access device: Secondary | ICD-10-CM | POA: Diagnosis not present

## 2021-03-26 DIAGNOSIS — I483 Typical atrial flutter: Secondary | ICD-10-CM | POA: Diagnosis not present

## 2021-03-26 DIAGNOSIS — G459 Transient cerebral ischemic attack, unspecified: Secondary | ICD-10-CM | POA: Diagnosis not present

## 2021-03-26 DIAGNOSIS — R7881 Bacteremia: Secondary | ICD-10-CM | POA: Diagnosis not present

## 2021-03-26 DIAGNOSIS — I447 Left bundle-branch block, unspecified: Secondary | ICD-10-CM | POA: Diagnosis not present

## 2021-03-26 DIAGNOSIS — B9562 Methicillin resistant Staphylococcus aureus infection as the cause of diseases classified elsewhere: Secondary | ICD-10-CM | POA: Diagnosis not present

## 2021-03-26 DIAGNOSIS — Z45018 Encounter for adjustment and management of other part of cardiac pacemaker: Secondary | ICD-10-CM | POA: Diagnosis not present

## 2021-03-27 DIAGNOSIS — I38 Endocarditis, valve unspecified: Secondary | ICD-10-CM | POA: Diagnosis not present

## 2021-03-27 DIAGNOSIS — T826XXA Infection and inflammatory reaction due to cardiac valve prosthesis, initial encounter: Secondary | ICD-10-CM | POA: Diagnosis not present

## 2021-03-27 DIAGNOSIS — A4102 Sepsis due to Methicillin resistant Staphylococcus aureus: Secondary | ICD-10-CM | POA: Diagnosis not present

## 2021-03-27 DIAGNOSIS — Z792 Long term (current) use of antibiotics: Secondary | ICD-10-CM | POA: Diagnosis not present

## 2021-03-29 DIAGNOSIS — I447 Left bundle-branch block, unspecified: Secondary | ICD-10-CM | POA: Diagnosis not present

## 2021-04-01 DIAGNOSIS — G459 Transient cerebral ischemic attack, unspecified: Secondary | ICD-10-CM | POA: Diagnosis not present

## 2021-04-01 DIAGNOSIS — I447 Left bundle-branch block, unspecified: Secondary | ICD-10-CM | POA: Diagnosis not present

## 2021-04-01 DIAGNOSIS — B9562 Methicillin resistant Staphylococcus aureus infection as the cause of diseases classified elsewhere: Secondary | ICD-10-CM | POA: Diagnosis not present

## 2021-04-01 DIAGNOSIS — R7881 Bacteremia: Secondary | ICD-10-CM | POA: Diagnosis not present

## 2021-04-02 DIAGNOSIS — R7881 Bacteremia: Secondary | ICD-10-CM | POA: Diagnosis not present

## 2021-04-02 DIAGNOSIS — B9562 Methicillin resistant Staphylococcus aureus infection as the cause of diseases classified elsewhere: Secondary | ICD-10-CM | POA: Diagnosis not present

## 2021-04-02 DIAGNOSIS — I447 Left bundle-branch block, unspecified: Secondary | ICD-10-CM | POA: Diagnosis not present

## 2021-04-02 DIAGNOSIS — G459 Transient cerebral ischemic attack, unspecified: Secondary | ICD-10-CM | POA: Diagnosis not present

## 2021-04-04 DIAGNOSIS — I447 Left bundle-branch block, unspecified: Secondary | ICD-10-CM | POA: Diagnosis not present

## 2021-04-06 DIAGNOSIS — I447 Left bundle-branch block, unspecified: Secondary | ICD-10-CM | POA: Diagnosis not present

## 2021-04-13 DIAGNOSIS — Z4659 Encounter for fitting and adjustment of other gastrointestinal appliance and device: Secondary | ICD-10-CM | POA: Diagnosis not present

## 2021-04-13 DIAGNOSIS — E639 Nutritional deficiency, unspecified: Secondary | ICD-10-CM | POA: Diagnosis not present

## 2021-04-14 DIAGNOSIS — Z951 Presence of aortocoronary bypass graft: Secondary | ICD-10-CM | POA: Diagnosis not present

## 2021-04-14 DIAGNOSIS — Z4659 Encounter for fitting and adjustment of other gastrointestinal appliance and device: Secondary | ICD-10-CM | POA: Diagnosis not present

## 2021-04-14 DIAGNOSIS — K5730 Diverticulosis of large intestine without perforation or abscess without bleeding: Secondary | ICD-10-CM | POA: Diagnosis not present

## 2021-04-14 DIAGNOSIS — Z9889 Other specified postprocedural states: Secondary | ICD-10-CM | POA: Diagnosis not present

## 2021-04-15 DIAGNOSIS — Z4659 Encounter for fitting and adjustment of other gastrointestinal appliance and device: Secondary | ICD-10-CM | POA: Diagnosis not present

## 2021-04-15 DIAGNOSIS — E46 Unspecified protein-calorie malnutrition: Secondary | ICD-10-CM | POA: Diagnosis not present

## 2021-04-16 DIAGNOSIS — T826XXD Infection and inflammatory reaction due to cardiac valve prosthesis, subsequent encounter: Secondary | ICD-10-CM | POA: Diagnosis not present

## 2021-04-16 DIAGNOSIS — I447 Left bundle-branch block, unspecified: Secondary | ICD-10-CM | POA: Diagnosis not present

## 2021-04-16 DIAGNOSIS — I38 Endocarditis, valve unspecified: Secondary | ICD-10-CM | POA: Diagnosis not present

## 2021-04-17 DIAGNOSIS — F05 Delirium due to known physiological condition: Secondary | ICD-10-CM | POA: Diagnosis not present

## 2021-04-17 DIAGNOSIS — Z4659 Encounter for fitting and adjustment of other gastrointestinal appliance and device: Secondary | ICD-10-CM | POA: Diagnosis not present

## 2021-04-17 DIAGNOSIS — J9811 Atelectasis: Secondary | ICD-10-CM | POA: Diagnosis not present

## 2021-04-21 DIAGNOSIS — R918 Other nonspecific abnormal finding of lung field: Secondary | ICD-10-CM | POA: Diagnosis not present

## 2021-04-21 DIAGNOSIS — Z4659 Encounter for fitting and adjustment of other gastrointestinal appliance and device: Secondary | ICD-10-CM | POA: Diagnosis not present

## 2021-04-21 DIAGNOSIS — Z978 Presence of other specified devices: Secondary | ICD-10-CM | POA: Diagnosis not present

## 2021-04-24 DIAGNOSIS — I447 Left bundle-branch block, unspecified: Secondary | ICD-10-CM | POA: Diagnosis not present

## 2021-04-24 DIAGNOSIS — Z45018 Encounter for adjustment and management of other part of cardiac pacemaker: Secondary | ICD-10-CM | POA: Diagnosis not present

## 2021-04-29 DIAGNOSIS — G309 Alzheimer's disease, unspecified: Secondary | ICD-10-CM | POA: Diagnosis not present

## 2021-04-29 DIAGNOSIS — I330 Acute and subacute infective endocarditis: Secondary | ICD-10-CM | POA: Diagnosis not present

## 2021-04-29 DIAGNOSIS — I447 Left bundle-branch block, unspecified: Secondary | ICD-10-CM | POA: Diagnosis not present

## 2021-04-29 DIAGNOSIS — F0280 Dementia in other diseases classified elsewhere without behavioral disturbance: Secondary | ICD-10-CM | POA: Diagnosis not present

## 2021-04-29 DIAGNOSIS — R630 Anorexia: Secondary | ICD-10-CM | POA: Diagnosis not present

## 2021-04-29 DIAGNOSIS — R5381 Other malaise: Secondary | ICD-10-CM | POA: Diagnosis not present

## 2021-04-29 DIAGNOSIS — I5023 Acute on chronic systolic (congestive) heart failure: Secondary | ICD-10-CM | POA: Diagnosis not present

## 2021-04-29 DIAGNOSIS — E119 Type 2 diabetes mellitus without complications: Secondary | ICD-10-CM | POA: Diagnosis not present

## 2021-04-29 DIAGNOSIS — R4189 Other symptoms and signs involving cognitive functions and awareness: Secondary | ICD-10-CM | POA: Diagnosis not present

## 2021-04-30 DIAGNOSIS — G309 Alzheimer's disease, unspecified: Secondary | ICD-10-CM | POA: Diagnosis not present

## 2021-04-30 DIAGNOSIS — I5023 Acute on chronic systolic (congestive) heart failure: Secondary | ICD-10-CM | POA: Diagnosis not present

## 2021-04-30 DIAGNOSIS — R4189 Other symptoms and signs involving cognitive functions and awareness: Secondary | ICD-10-CM | POA: Diagnosis not present

## 2021-04-30 DIAGNOSIS — I330 Acute and subacute infective endocarditis: Secondary | ICD-10-CM | POA: Diagnosis not present

## 2021-04-30 DIAGNOSIS — F0280 Dementia in other diseases classified elsewhere without behavioral disturbance: Secondary | ICD-10-CM | POA: Diagnosis not present

## 2021-04-30 DIAGNOSIS — R630 Anorexia: Secondary | ICD-10-CM | POA: Diagnosis not present

## 2021-04-30 DIAGNOSIS — E119 Type 2 diabetes mellitus without complications: Secondary | ICD-10-CM | POA: Diagnosis not present

## 2021-04-30 DIAGNOSIS — I447 Left bundle-branch block, unspecified: Secondary | ICD-10-CM | POA: Diagnosis not present

## 2021-04-30 DIAGNOSIS — R5381 Other malaise: Secondary | ICD-10-CM | POA: Diagnosis not present

## 2021-05-04 DIAGNOSIS — Z515 Encounter for palliative care: Secondary | ICD-10-CM | POA: Diagnosis not present

## 2021-05-04 DIAGNOSIS — E119 Type 2 diabetes mellitus without complications: Secondary | ICD-10-CM | POA: Diagnosis not present

## 2021-05-04 DIAGNOSIS — R0602 Shortness of breath: Secondary | ICD-10-CM | POA: Diagnosis not present

## 2021-05-04 DIAGNOSIS — I330 Acute and subacute infective endocarditis: Secondary | ICD-10-CM | POA: Diagnosis not present

## 2021-05-04 DIAGNOSIS — R630 Anorexia: Secondary | ICD-10-CM | POA: Diagnosis not present

## 2021-05-04 DIAGNOSIS — I5023 Acute on chronic systolic (congestive) heart failure: Secondary | ICD-10-CM | POA: Diagnosis not present

## 2021-05-04 DIAGNOSIS — R5381 Other malaise: Secondary | ICD-10-CM | POA: Diagnosis not present

## 2021-05-04 DIAGNOSIS — R4189 Other symptoms and signs involving cognitive functions and awareness: Secondary | ICD-10-CM | POA: Diagnosis not present

## 2021-05-04 DIAGNOSIS — R1312 Dysphagia, oropharyngeal phase: Secondary | ICD-10-CM | POA: Diagnosis not present

## 2021-05-04 DIAGNOSIS — F0280 Dementia in other diseases classified elsewhere without behavioral disturbance: Secondary | ICD-10-CM | POA: Diagnosis not present

## 2021-05-04 DIAGNOSIS — G309 Alzheimer's disease, unspecified: Secondary | ICD-10-CM | POA: Diagnosis not present

## 2021-05-04 DIAGNOSIS — I447 Left bundle-branch block, unspecified: Secondary | ICD-10-CM | POA: Diagnosis not present

## 2021-05-04 DIAGNOSIS — Z789 Other specified health status: Secondary | ICD-10-CM | POA: Diagnosis not present

## 2021-05-05 DIAGNOSIS — F0280 Dementia in other diseases classified elsewhere without behavioral disturbance: Secondary | ICD-10-CM | POA: Diagnosis not present

## 2021-05-05 DIAGNOSIS — G309 Alzheimer's disease, unspecified: Secondary | ICD-10-CM | POA: Diagnosis not present

## 2021-05-05 DIAGNOSIS — Z515 Encounter for palliative care: Secondary | ICD-10-CM | POA: Diagnosis not present

## 2021-05-05 DIAGNOSIS — I5023 Acute on chronic systolic (congestive) heart failure: Secondary | ICD-10-CM | POA: Diagnosis not present

## 2021-05-05 DIAGNOSIS — E119 Type 2 diabetes mellitus without complications: Secondary | ICD-10-CM | POA: Diagnosis not present

## 2021-05-05 DIAGNOSIS — R0602 Shortness of breath: Secondary | ICD-10-CM | POA: Diagnosis not present

## 2021-05-05 DIAGNOSIS — Z789 Other specified health status: Secondary | ICD-10-CM | POA: Diagnosis not present

## 2021-05-05 DIAGNOSIS — R630 Anorexia: Secondary | ICD-10-CM | POA: Diagnosis not present

## 2021-05-05 DIAGNOSIS — R4189 Other symptoms and signs involving cognitive functions and awareness: Secondary | ICD-10-CM | POA: Diagnosis not present

## 2021-05-05 DIAGNOSIS — I447 Left bundle-branch block, unspecified: Secondary | ICD-10-CM | POA: Diagnosis not present

## 2021-05-05 DIAGNOSIS — I330 Acute and subacute infective endocarditis: Secondary | ICD-10-CM | POA: Diagnosis not present

## 2021-05-05 DIAGNOSIS — R5381 Other malaise: Secondary | ICD-10-CM | POA: Diagnosis not present

## 2021-05-06 DIAGNOSIS — R1312 Dysphagia, oropharyngeal phase: Secondary | ICD-10-CM | POA: Diagnosis not present

## 2021-05-06 DIAGNOSIS — Z789 Other specified health status: Secondary | ICD-10-CM | POA: Diagnosis not present

## 2021-05-06 DIAGNOSIS — R5381 Other malaise: Secondary | ICD-10-CM | POA: Diagnosis not present

## 2021-05-06 DIAGNOSIS — R0602 Shortness of breath: Secondary | ICD-10-CM | POA: Diagnosis not present

## 2021-05-06 DIAGNOSIS — Z515 Encounter for palliative care: Secondary | ICD-10-CM | POA: Diagnosis not present

## 2021-05-07 DIAGNOSIS — R1312 Dysphagia, oropharyngeal phase: Secondary | ICD-10-CM | POA: Diagnosis not present

## 2021-05-08 DIAGNOSIS — R0602 Shortness of breath: Secondary | ICD-10-CM | POA: Diagnosis not present

## 2021-05-08 DIAGNOSIS — Z515 Encounter for palliative care: Secondary | ICD-10-CM | POA: Diagnosis not present

## 2021-05-08 DIAGNOSIS — Z789 Other specified health status: Secondary | ICD-10-CM | POA: Diagnosis not present

## 2021-05-08 DIAGNOSIS — R5381 Other malaise: Secondary | ICD-10-CM | POA: Diagnosis not present

## 2021-05-09 DIAGNOSIS — R1312 Dysphagia, oropharyngeal phase: Secondary | ICD-10-CM | POA: Diagnosis not present

## 2021-05-24 DEATH — deceased

## 2021-09-19 IMAGING — CT CT HEAD CODE STROKE
4 series · 17 of 47 positions shown, 19 images · non-contrast
Comparison: None.

CLINICAL DATA: Code stroke. Acute neuro deficit. Right-sided
weakness, facial droop

EXAM:
CT HEAD WITHOUT CONTRAST
TECHNIQUE: Contiguous axial images were obtained from the base of the skull
through the vertex without intravenous contrast.

[Series 3: head wo · axial · 0.44mm/px · z∈[+1163,+1288]mm · 7 of 35 slices shown, 9 images]
[im 5/35  brain]
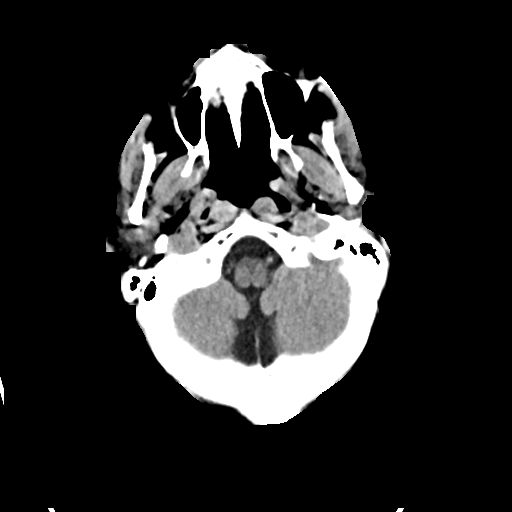
[im 5/35  bone]
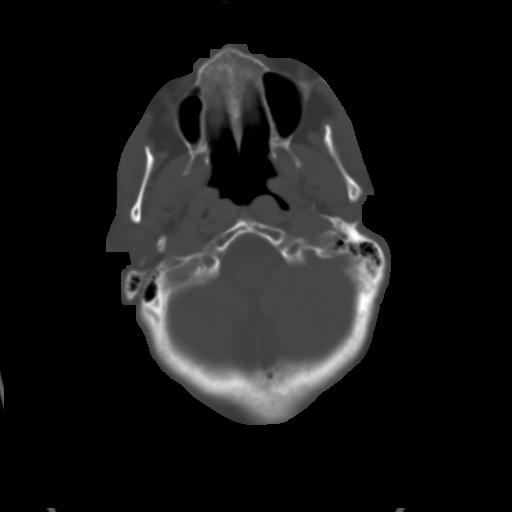
[im 9/35  brain]
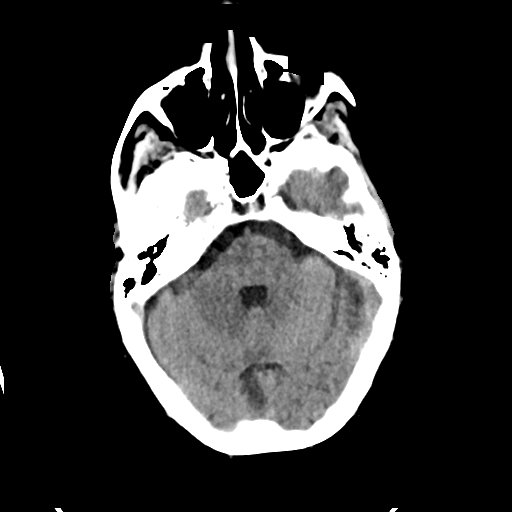
[im 13/35  brain]
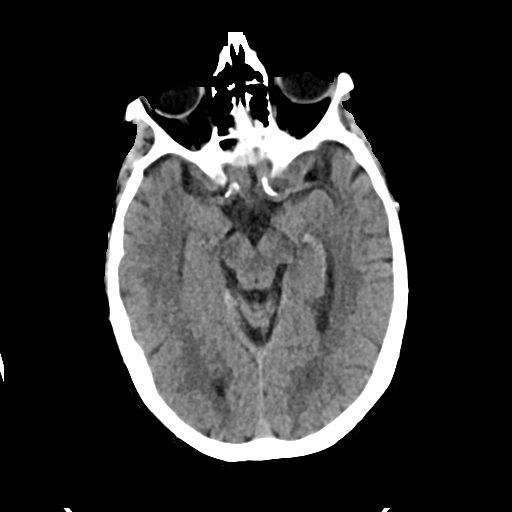
[im 18/35  brain]
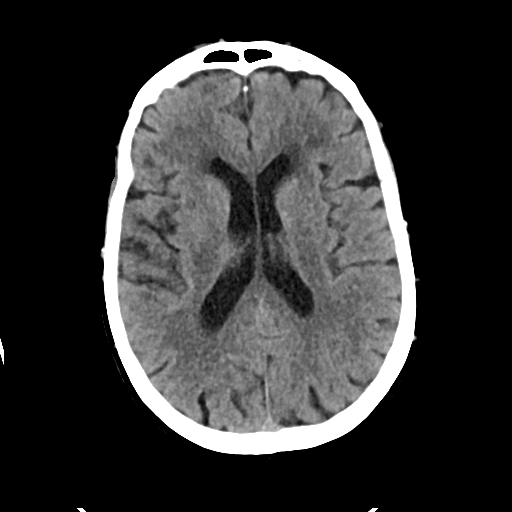
[im 22/35  brain]
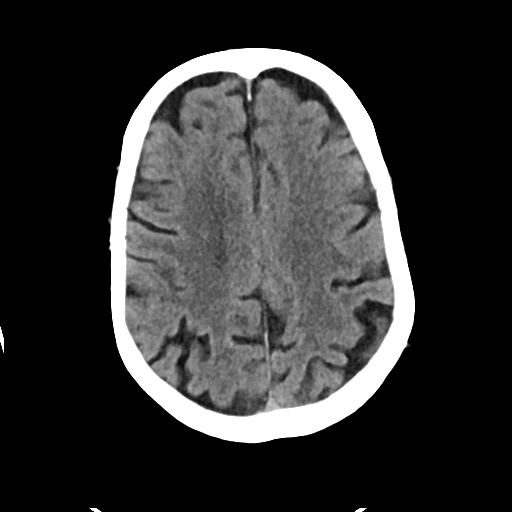
[im 22/35  bone]
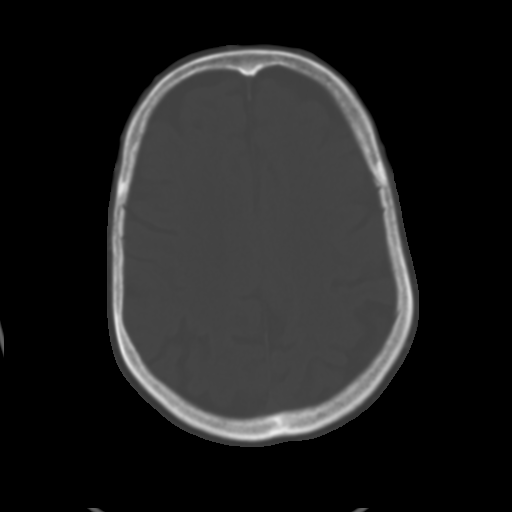
[im 26/35  brain]
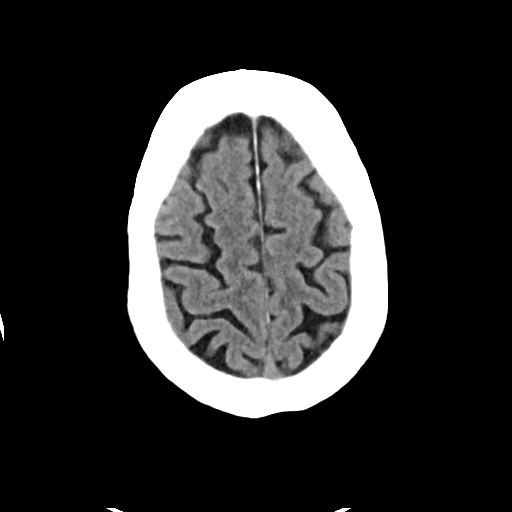
[im 30/35  brain]
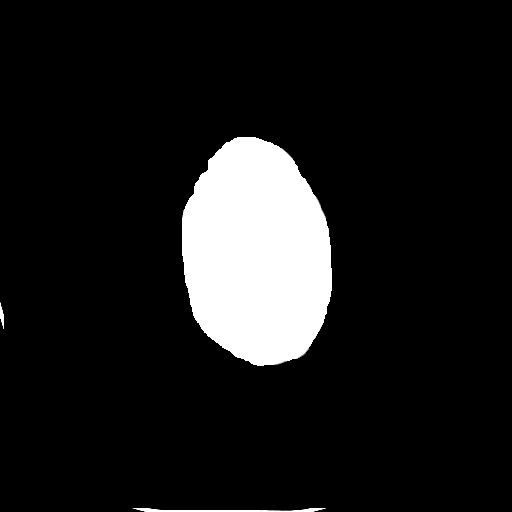

[Series 4: head bone · axial · 0.44mm/px · z∈[+1159,+1219]mm · 4 of 87 slices shown]
[im 9/87  bone]
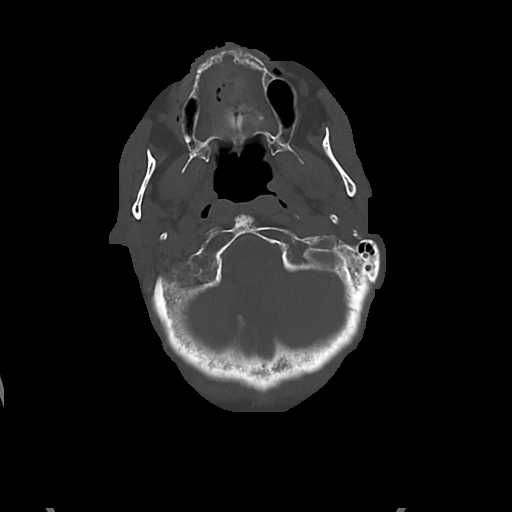
[im 18/87  bone]
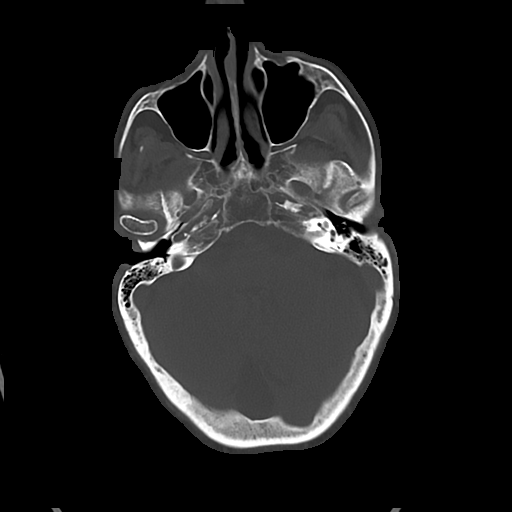
[im 26/87  bone]
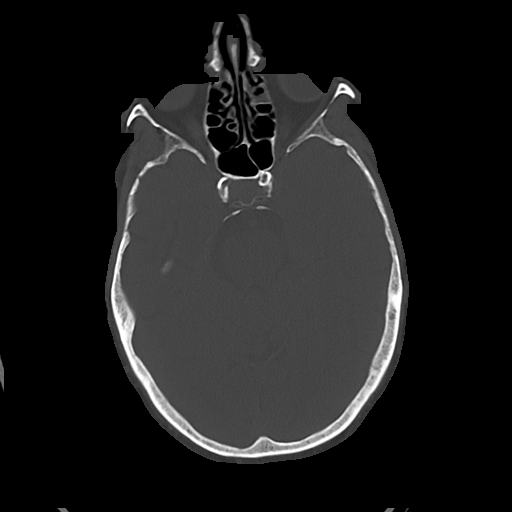
[im 39/87  bone]
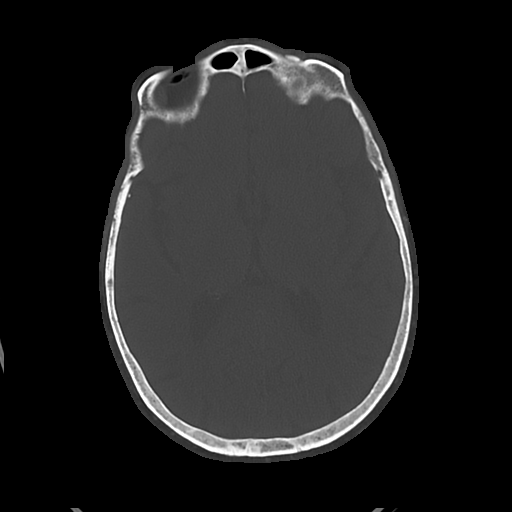

[Series 5: cor soft · coronal · 0.32mm/px · 3 of 68 slices shown]
[im 23/68  brain]
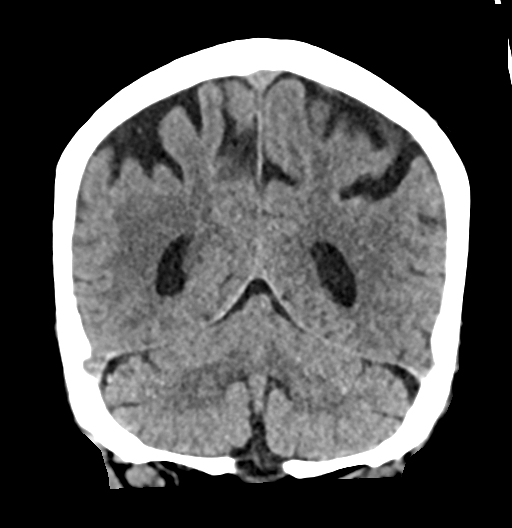
[im 30/68  brain]
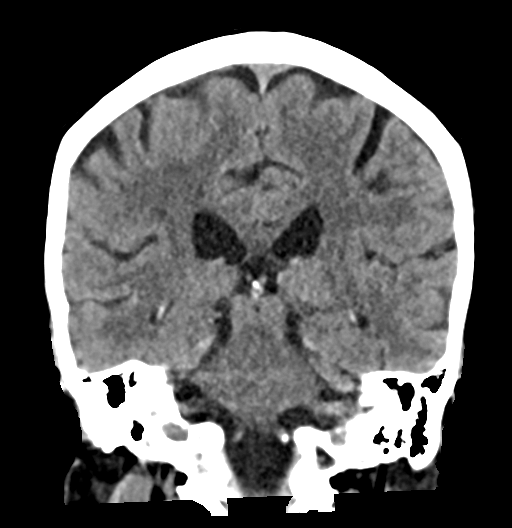
[im 38/68  brain]
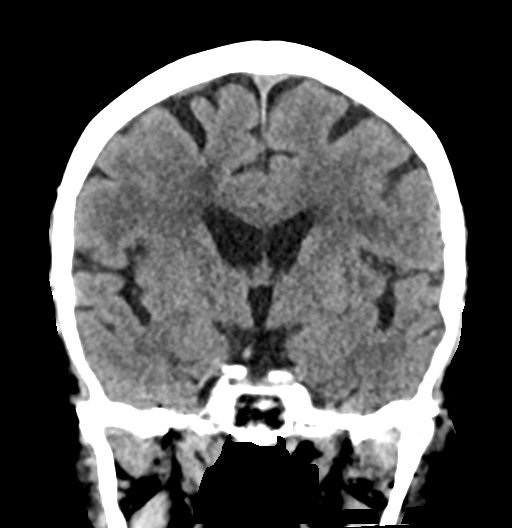

[Series 6: sag soft · sagittal · 0.33mm/px · 3 of 54 slices shown]
[im 18/54  brain]
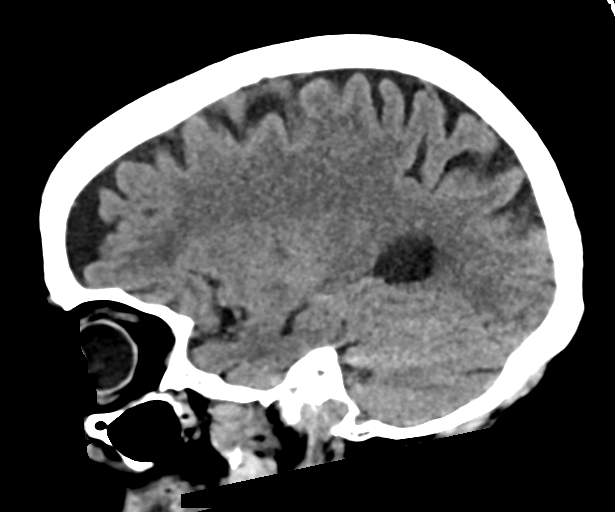
[im 27/54  brain]
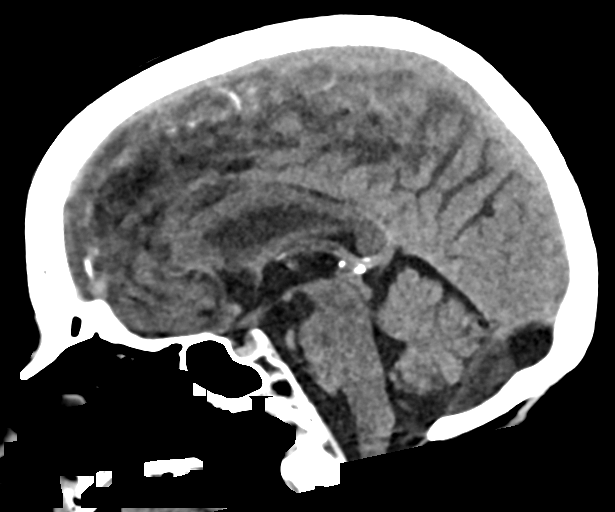
[im 36/54  brain]
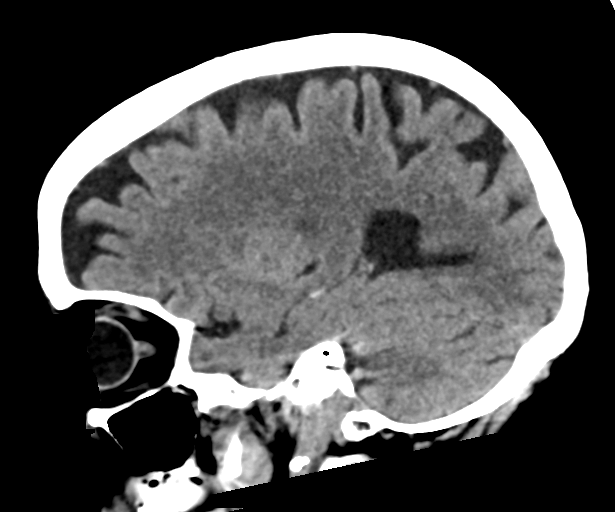

[17 of 47 positions shown; findings below may reference images not displayed]

FINDINGS: Brain: No evidence of acute infarction, hemorrhage, hydrocephalus,
extra-axial collection or mass lesion/mass effect. Mild white matter
hypodensity bilaterally in the frontal lobes, symmetric and
appearing chronic.

Vascular: Negative for hyperdense vessel. Atherosclerotic
calcification in the cavernous carotid bilaterally and in the distal
vertebral artery bilaterally.

Skull: Negative

Sinuses/Orbits: Mild mucosal edema ethmoid sinuses bilaterally
otherwise clear. Negative orbit

Other: None

ASPECTS ([place] Stroke Program Early CT Score)

- Ganglionic level infarction (caudate, lentiform nuclei, internal
capsule, insula, M1-M3 cortex): 7

- Supraganglionic infarction (M4-M6 cortex): 3

Total score (0-10 with 10 being normal): 10
IMPRESSION: 1. No acute intracranial abnormality
2. ASPECTS is 10
3. Mild chronic microvascular ischemic change in the white matter.
4. Code stroke imaging results were communicated on 03/03/2021 at
[DATE] to provider Bone via text page
